# Patient Record
Sex: Female | Born: 1955 | Race: White | Hispanic: No | Marital: Married | Smoking: Former smoker
Health system: Southern US, Community
[De-identification: ages and names within clinical notes are randomized; demographics above are authoritative.]

## PROBLEM LIST (undated history)

## (undated) DIAGNOSIS — N210 Calculus in bladder: Secondary | ICD-10-CM

## (undated) DIAGNOSIS — G935 Compression of brain: Secondary | ICD-10-CM

## (undated) DIAGNOSIS — I7300 Raynaud's syndrome without gangrene: Secondary | ICD-10-CM

## (undated) DIAGNOSIS — F329 Major depressive disorder, single episode, unspecified: Secondary | ICD-10-CM

## (undated) DIAGNOSIS — G43009 Migraine without aura, not intractable, without status migrainosus: Secondary | ICD-10-CM

## (undated) DIAGNOSIS — M7550 Bursitis of unspecified shoulder: Secondary | ICD-10-CM

## (undated) DIAGNOSIS — I10 Essential (primary) hypertension: Secondary | ICD-10-CM

## (undated) DIAGNOSIS — M797 Fibromyalgia: Secondary | ICD-10-CM

## (undated) DIAGNOSIS — N390 Urinary tract infection, site not specified: Secondary | ICD-10-CM

## (undated) DIAGNOSIS — M545 Low back pain: Secondary | ICD-10-CM

## (undated) DIAGNOSIS — Z87442 Personal history of urinary calculi: Secondary | ICD-10-CM

## (undated) DIAGNOSIS — N23 Unspecified renal colic: Secondary | ICD-10-CM

## (undated) DIAGNOSIS — E785 Hyperlipidemia, unspecified: Secondary | ICD-10-CM

## (undated) DIAGNOSIS — R112 Nausea with vomiting, unspecified: Secondary | ICD-10-CM

## (undated) DIAGNOSIS — F419 Anxiety disorder, unspecified: Secondary | ICD-10-CM

## (undated) DIAGNOSIS — M359 Systemic involvement of connective tissue, unspecified: Secondary | ICD-10-CM

## (undated) DIAGNOSIS — Z9889 Other specified postprocedural states: Secondary | ICD-10-CM

## (undated) DIAGNOSIS — Z79899 Other long term (current) drug therapy: Secondary | ICD-10-CM

## (undated) HISTORY — PX: WISDOM TOOTH EXTRACTION: SHX21

## (undated) HISTORY — DX: Bursitis of unspecified shoulder: M75.50

## (undated) HISTORY — DX: Calculus in bladder: N21.0

## (undated) HISTORY — PX: OTHER SURGICAL HISTORY: SHX169

## (undated) HISTORY — DX: Other long term (current) drug therapy: Z79.899

## (undated) HISTORY — DX: Major depressive disorder, single episode, unspecified: F32.9

## (undated) HISTORY — DX: Migraine without aura, not intractable, without status migrainosus: G43.009

## (undated) HISTORY — DX: Fibromyalgia: M79.7

## (undated) HISTORY — DX: Essential (primary) hypertension: I10

## (undated) HISTORY — DX: Systemic involvement of connective tissue, unspecified: M35.9

## (undated) HISTORY — DX: Hyperlipidemia, unspecified: E78.5

## (undated) HISTORY — DX: Personal history of urinary calculi: Z87.442

## (undated) HISTORY — DX: Urinary tract infection, site not specified: N39.0

## (undated) HISTORY — DX: Low back pain: M54.5

## (undated) HISTORY — DX: Raynaud's syndrome without gangrene: I73.00

## (undated) HISTORY — DX: Unspecified renal colic: N23

---

## 1988-07-08 HISTORY — PX: ABDOMINAL HYSTERECTOMY: SHX81

## 1998-11-20 ENCOUNTER — Inpatient Hospital Stay (HOSPITAL_COMMUNITY): Admission: EM | Admit: 1998-11-20 | Discharge: 1998-11-23 | Payer: Self-pay | Admitting: Obstetrics and Gynecology

## 1998-11-27 ENCOUNTER — Encounter: Payer: Self-pay | Admitting: General Surgery

## 1998-11-27 ENCOUNTER — Observation Stay (HOSPITAL_COMMUNITY): Admission: EM | Admit: 1998-11-27 | Discharge: 1998-11-28 | Payer: Self-pay | Admitting: *Deleted

## 1998-12-18 ENCOUNTER — Ambulatory Visit (HOSPITAL_COMMUNITY): Admission: RE | Admit: 1998-12-18 | Discharge: 1998-12-18 | Payer: Self-pay | Admitting: General Surgery

## 2004-04-13 ENCOUNTER — Emergency Department (HOSPITAL_COMMUNITY): Admission: EM | Admit: 2004-04-13 | Discharge: 2004-04-13 | Payer: Self-pay | Admitting: *Deleted

## 2004-05-28 ENCOUNTER — Ambulatory Visit: Payer: Self-pay | Admitting: Internal Medicine

## 2004-06-14 ENCOUNTER — Ambulatory Visit (HOSPITAL_COMMUNITY): Admission: RE | Admit: 2004-06-14 | Discharge: 2004-06-14 | Payer: Self-pay | Admitting: Urology

## 2004-06-21 ENCOUNTER — Ambulatory Visit (HOSPITAL_COMMUNITY): Admission: RE | Admit: 2004-06-21 | Discharge: 2004-06-21 | Payer: Self-pay | Admitting: Urology

## 2004-07-03 ENCOUNTER — Ambulatory Visit: Payer: Self-pay | Admitting: Internal Medicine

## 2004-10-16 ENCOUNTER — Ambulatory Visit: Payer: Self-pay | Admitting: Internal Medicine

## 2004-12-12 ENCOUNTER — Ambulatory Visit: Payer: Self-pay | Admitting: Internal Medicine

## 2005-06-05 ENCOUNTER — Ambulatory Visit: Payer: Self-pay | Admitting: Internal Medicine

## 2005-07-22 ENCOUNTER — Ambulatory Visit: Payer: Self-pay | Admitting: Internal Medicine

## 2005-07-29 ENCOUNTER — Ambulatory Visit: Payer: Self-pay | Admitting: Internal Medicine

## 2005-10-21 ENCOUNTER — Ambulatory Visit: Payer: Self-pay | Admitting: Internal Medicine

## 2005-10-28 ENCOUNTER — Encounter: Payer: Self-pay | Admitting: Internal Medicine

## 2005-12-27 ENCOUNTER — Ambulatory Visit: Payer: Self-pay | Admitting: Internal Medicine

## 2006-03-31 ENCOUNTER — Ambulatory Visit: Payer: Self-pay | Admitting: Internal Medicine

## 2007-04-28 DIAGNOSIS — N390 Urinary tract infection, site not specified: Secondary | ICD-10-CM

## 2007-04-28 DIAGNOSIS — N200 Calculus of kidney: Secondary | ICD-10-CM | POA: Insufficient documentation

## 2007-04-28 DIAGNOSIS — I10 Essential (primary) hypertension: Secondary | ICD-10-CM | POA: Insufficient documentation

## 2007-04-28 DIAGNOSIS — Z87440 Personal history of urinary (tract) infections: Secondary | ICD-10-CM | POA: Insufficient documentation

## 2007-04-28 DIAGNOSIS — Z87442 Personal history of urinary calculi: Secondary | ICD-10-CM

## 2007-04-28 HISTORY — DX: Urinary tract infection, site not specified: N39.0

## 2007-04-28 HISTORY — DX: Personal history of urinary calculi: Z87.442

## 2007-04-28 HISTORY — DX: Essential (primary) hypertension: I10

## 2007-04-30 ENCOUNTER — Telehealth: Payer: Self-pay | Admitting: Internal Medicine

## 2007-05-26 ENCOUNTER — Encounter: Payer: Self-pay | Admitting: Internal Medicine

## 2007-10-02 ENCOUNTER — Encounter: Payer: Self-pay | Admitting: Internal Medicine

## 2007-10-05 ENCOUNTER — Ambulatory Visit: Payer: Self-pay | Admitting: Internal Medicine

## 2007-10-28 ENCOUNTER — Encounter: Payer: Self-pay | Admitting: Internal Medicine

## 2007-11-13 ENCOUNTER — Ambulatory Visit: Payer: Self-pay | Admitting: Internal Medicine

## 2007-11-13 DIAGNOSIS — R197 Diarrhea, unspecified: Secondary | ICD-10-CM | POA: Insufficient documentation

## 2007-11-13 LAB — CONVERTED CEMR LAB
Bilirubin Urine: NEGATIVE
Glucose, Urine, Semiquant: NEGATIVE
Ketones, urine, test strip: NEGATIVE
Nitrite: NEGATIVE
Specific Gravity, Urine: <1.005
Urobilinogen, UA: NEGATIVE
pH: 7

## 2007-12-02 ENCOUNTER — Ambulatory Visit: Payer: Self-pay | Admitting: Internal Medicine

## 2007-12-02 DIAGNOSIS — G8929 Other chronic pain: Secondary | ICD-10-CM | POA: Insufficient documentation

## 2007-12-02 DIAGNOSIS — M545 Low back pain, unspecified: Secondary | ICD-10-CM

## 2007-12-02 HISTORY — DX: Low back pain, unspecified: M54.50

## 2007-12-10 ENCOUNTER — Ambulatory Visit: Payer: Self-pay | Admitting: Internal Medicine

## 2007-12-14 ENCOUNTER — Encounter: Payer: Self-pay | Admitting: Internal Medicine

## 2007-12-14 ENCOUNTER — Ambulatory Visit: Payer: Self-pay | Admitting: Internal Medicine

## 2007-12-14 LAB — HM COLONOSCOPY

## 2007-12-15 ENCOUNTER — Encounter: Payer: Self-pay | Admitting: Internal Medicine

## 2007-12-17 ENCOUNTER — Telehealth (INDEPENDENT_AMBULATORY_CARE_PROVIDER_SITE_OTHER): Payer: Self-pay

## 2008-01-01 ENCOUNTER — Ambulatory Visit: Payer: Self-pay | Admitting: Internal Medicine

## 2008-01-01 DIAGNOSIS — M359 Systemic involvement of connective tissue, unspecified: Secondary | ICD-10-CM

## 2008-01-01 DIAGNOSIS — K52831 Collagenous colitis: Secondary | ICD-10-CM | POA: Insufficient documentation

## 2008-01-01 HISTORY — DX: Systemic involvement of connective tissue, unspecified: M35.9

## 2008-01-22 ENCOUNTER — Telehealth: Payer: Self-pay | Admitting: Internal Medicine

## 2008-01-29 ENCOUNTER — Ambulatory Visit: Payer: Self-pay | Admitting: Internal Medicine

## 2008-01-29 DIAGNOSIS — R1011 Right upper quadrant pain: Secondary | ICD-10-CM | POA: Insufficient documentation

## 2008-02-01 LAB — CONVERTED CEMR LAB
ALT: 23 units/L (ref 0–35)
AST: 15 units/L (ref 0–37)
Albumin: 4 g/dL (ref 3.5–5.2)
Alkaline Phosphatase: 62 units/L (ref 39–117)
Bilirubin, Direct: 0.1 mg/dL (ref 0.0–0.3)
Total Bilirubin: 0.6 mg/dL (ref 0.3–1.2)
Total Protein: 7.1 g/dL (ref 6.0–8.3)

## 2008-02-11 ENCOUNTER — Telehealth: Payer: Self-pay | Admitting: Internal Medicine

## 2008-02-12 ENCOUNTER — Ambulatory Visit: Payer: Self-pay | Admitting: Internal Medicine

## 2008-02-12 DIAGNOSIS — N23 Unspecified renal colic: Secondary | ICD-10-CM

## 2008-02-12 HISTORY — DX: Unspecified renal colic: N23

## 2008-02-12 LAB — CONVERTED CEMR LAB
Bilirubin Urine: NEGATIVE
Glucose, Urine, Semiquant: NEGATIVE
Ketones, urine, test strip: NEGATIVE
Nitrite: NEGATIVE
Specific Gravity, Urine: <1.005
Urobilinogen, UA: NEGATIVE
pH: 7

## 2008-02-15 ENCOUNTER — Ambulatory Visit (HOSPITAL_COMMUNITY): Admission: RE | Admit: 2008-02-15 | Discharge: 2008-02-15 | Payer: Self-pay | Admitting: Urology

## 2008-02-20 ENCOUNTER — Telehealth (INDEPENDENT_AMBULATORY_CARE_PROVIDER_SITE_OTHER): Payer: Self-pay | Admitting: *Deleted

## 2008-02-20 ENCOUNTER — Ambulatory Visit: Payer: Self-pay | Admitting: Family Medicine

## 2008-02-20 DIAGNOSIS — J029 Acute pharyngitis, unspecified: Secondary | ICD-10-CM | POA: Insufficient documentation

## 2008-02-20 LAB — CONVERTED CEMR LAB: Rapid Strep: NEGATIVE

## 2008-03-02 ENCOUNTER — Encounter: Payer: Self-pay | Admitting: Internal Medicine

## 2008-04-28 ENCOUNTER — Encounter: Payer: Self-pay | Admitting: Internal Medicine

## 2008-07-12 ENCOUNTER — Encounter: Payer: Self-pay | Admitting: Internal Medicine

## 2008-09-16 ENCOUNTER — Encounter: Payer: Self-pay | Admitting: Internal Medicine

## 2008-09-21 ENCOUNTER — Ambulatory Visit (HOSPITAL_BASED_OUTPATIENT_CLINIC_OR_DEPARTMENT_OTHER): Admission: RE | Admit: 2008-09-21 | Discharge: 2008-09-21 | Payer: Self-pay | Admitting: Urology

## 2008-10-06 ENCOUNTER — Encounter: Payer: Self-pay | Admitting: Internal Medicine

## 2008-12-02 ENCOUNTER — Encounter: Payer: Self-pay | Admitting: Internal Medicine

## 2008-12-06 ENCOUNTER — Ambulatory Visit: Payer: Self-pay | Admitting: Internal Medicine

## 2008-12-06 LAB — CONVERTED CEMR LAB
ALT: 22 units/L (ref 0–35)
AST: 19 units/L (ref 0–37)
Albumin: 3.8 g/dL (ref 3.5–5.2)
Alkaline Phosphatase: 61 units/L (ref 39–117)
BUN: 9 mg/dL (ref 6–23)
Basophils Absolute: 0.1 10*3/uL (ref 0.0–0.1)
Basophils Relative: 1.1 % (ref 0.0–3.0)
Bilirubin Urine: NEGATIVE
Bilirubin, Direct: 0.1 mg/dL (ref 0.0–0.3)
CO2: 28 meq/L (ref 19–32)
Calcium: 9.1 mg/dL (ref 8.4–10.5)
Chloride: 113 meq/L — ABNORMAL HIGH (ref 96–112)
Cholesterol: 296 mg/dL — ABNORMAL HIGH (ref 0–200)
Creatinine, Ser: 1 mg/dL (ref 0.4–1.2)
Direct LDL: 214.9 mg/dL
Eosinophils Absolute: 0.1 10*3/uL (ref 0.0–0.7)
Eosinophils Relative: 0.9 % (ref 0.0–5.0)
GFR calc non Af Amer: 62.12 mL/min (ref >60)
Glucose, Bld: 74 mg/dL (ref 70–99)
Glucose, Urine, Semiquant: NEGATIVE
HCT: 38.7 % (ref 36.0–46.0)
HDL: 73.7 mg/dL (ref >39.00)
Hemoglobin: 13.2 g/dL (ref 12.0–15.0)
Ketones, urine, test strip: NEGATIVE
Lymphocytes Relative: 34.1 % (ref 12.0–46.0)
Lymphs Abs: 2.2 10*3/uL (ref 0.7–4.0)
MCHC: 34 g/dL (ref 30.0–36.0)
MCV: 93 fL (ref 78.0–100.0)
Monocytes Absolute: 0.3 10*3/uL (ref 0.1–1.0)
Monocytes Relative: 5.3 % (ref 3.0–12.0)
Neutro Abs: 3.7 10*3/uL (ref 1.4–7.7)
Neutrophils Relative %: 58.6 % (ref 43.0–77.0)
Nitrite: NEGATIVE
Platelets: 275 10*3/uL (ref 150.0–400.0)
Potassium: 3.6 meq/L (ref 3.5–5.1)
Protein, U semiquant: NEGATIVE
RBC: 4.17 M/uL (ref 3.87–5.11)
RDW: 11.7 % (ref 11.5–14.6)
Sodium: 144 meq/L (ref 135–145)
Specific Gravity, Urine: 1.015
TSH: 0.91 microintl units/mL (ref 0.35–5.50)
Total Bilirubin: 0.8 mg/dL (ref 0.3–1.2)
Total CHOL/HDL Ratio: 4
Total Protein: 7 g/dL (ref 6.0–8.3)
Triglycerides: 80 mg/dL (ref 0.0–149.0)
Urobilinogen, UA: 0.2
VLDL: 16 mg/dL (ref 0.0–40.0)
WBC Urine, dipstick: NEGATIVE
WBC: 6.4 10*3/uL (ref 4.5–10.5)
pH: 7.5

## 2008-12-13 ENCOUNTER — Ambulatory Visit: Payer: Self-pay | Admitting: Internal Medicine

## 2008-12-13 DIAGNOSIS — E785 Hyperlipidemia, unspecified: Secondary | ICD-10-CM

## 2008-12-13 HISTORY — DX: Hyperlipidemia, unspecified: E78.5

## 2009-03-06 ENCOUNTER — Ambulatory Visit: Payer: Self-pay | Admitting: Internal Medicine

## 2009-03-06 LAB — CONVERTED CEMR LAB: IgA: 112 mg/dL (ref 68–378)

## 2009-03-09 LAB — CONVERTED CEMR LAB: Tissue Transglutaminase Ab, IgA: 0 units (ref <7)

## 2009-03-16 ENCOUNTER — Ambulatory Visit: Payer: Self-pay | Admitting: Internal Medicine

## 2009-04-04 ENCOUNTER — Ambulatory Visit: Payer: Self-pay | Admitting: Internal Medicine

## 2009-04-04 DIAGNOSIS — J069 Acute upper respiratory infection, unspecified: Secondary | ICD-10-CM | POA: Insufficient documentation

## 2009-04-20 ENCOUNTER — Encounter: Payer: Self-pay | Admitting: Internal Medicine

## 2009-04-22 ENCOUNTER — Ambulatory Visit (HOSPITAL_COMMUNITY): Admission: RE | Admit: 2009-04-22 | Discharge: 2009-04-22 | Payer: Self-pay | Admitting: Rheumatology

## 2009-04-27 ENCOUNTER — Ambulatory Visit: Payer: Self-pay | Admitting: Internal Medicine

## 2009-05-10 ENCOUNTER — Encounter: Admission: RE | Admit: 2009-05-10 | Discharge: 2009-05-22 | Payer: Self-pay | Admitting: Rheumatology

## 2009-06-06 ENCOUNTER — Encounter (INDEPENDENT_AMBULATORY_CARE_PROVIDER_SITE_OTHER): Payer: Self-pay | Admitting: *Deleted

## 2009-07-19 ENCOUNTER — Encounter: Admission: RE | Admit: 2009-07-19 | Discharge: 2009-07-19 | Payer: Self-pay | Admitting: Specialist

## 2009-09-14 ENCOUNTER — Ambulatory Visit: Payer: Self-pay | Admitting: Internal Medicine

## 2009-09-14 LAB — CONVERTED CEMR LAB: AST: 21 units/L (ref 0–37)

## 2010-01-04 ENCOUNTER — Telehealth: Payer: Self-pay | Admitting: Internal Medicine

## 2010-01-30 ENCOUNTER — Encounter: Payer: Self-pay | Admitting: Internal Medicine

## 2010-03-15 ENCOUNTER — Ambulatory Visit: Payer: Self-pay | Admitting: Internal Medicine

## 2010-04-10 ENCOUNTER — Telehealth: Payer: Self-pay | Admitting: Internal Medicine

## 2010-05-21 ENCOUNTER — Ambulatory Visit: Payer: Self-pay | Admitting: Cardiology

## 2010-05-21 ENCOUNTER — Observation Stay (HOSPITAL_COMMUNITY): Admission: EM | Admit: 2010-05-21 | Discharge: 2010-05-22 | Payer: Self-pay | Admitting: Emergency Medicine

## 2010-05-21 ENCOUNTER — Encounter: Payer: Self-pay | Admitting: Family Medicine

## 2010-05-21 ENCOUNTER — Ambulatory Visit: Payer: Self-pay | Admitting: Family Medicine

## 2010-05-21 DIAGNOSIS — F329 Major depressive disorder, single episode, unspecified: Secondary | ICD-10-CM

## 2010-05-21 DIAGNOSIS — F339 Major depressive disorder, recurrent, unspecified: Secondary | ICD-10-CM | POA: Insufficient documentation

## 2010-05-21 DIAGNOSIS — R079 Chest pain, unspecified: Secondary | ICD-10-CM | POA: Insufficient documentation

## 2010-05-21 DIAGNOSIS — F3289 Other specified depressive episodes: Secondary | ICD-10-CM

## 2010-05-21 HISTORY — DX: Major depressive disorder, single episode, unspecified: F32.9

## 2010-05-21 HISTORY — DX: Other specified depressive episodes: F32.89

## 2010-05-28 ENCOUNTER — Telehealth (INDEPENDENT_AMBULATORY_CARE_PROVIDER_SITE_OTHER): Payer: Self-pay | Admitting: *Deleted

## 2010-05-30 ENCOUNTER — Telehealth (INDEPENDENT_AMBULATORY_CARE_PROVIDER_SITE_OTHER): Payer: Self-pay | Admitting: *Deleted

## 2010-06-04 ENCOUNTER — Telehealth (INDEPENDENT_AMBULATORY_CARE_PROVIDER_SITE_OTHER): Payer: Self-pay | Admitting: *Deleted

## 2010-06-05 ENCOUNTER — Encounter (HOSPITAL_COMMUNITY)
Admission: RE | Admit: 2010-06-05 | Discharge: 2010-08-07 | Payer: Self-pay | Source: Home / Self Care | Attending: Cardiology | Admitting: Cardiology

## 2010-06-05 ENCOUNTER — Ambulatory Visit: Payer: Self-pay

## 2010-06-05 ENCOUNTER — Encounter: Payer: Self-pay | Admitting: *Deleted

## 2010-06-05 ENCOUNTER — Ambulatory Visit: Payer: Self-pay | Admitting: Cardiology

## 2010-06-06 ENCOUNTER — Encounter: Payer: Self-pay | Admitting: Internal Medicine

## 2010-06-07 ENCOUNTER — Telehealth: Payer: Self-pay | Admitting: Cardiology

## 2010-06-07 ENCOUNTER — Ambulatory Visit: Payer: Self-pay | Admitting: Cardiology

## 2010-07-11 ENCOUNTER — Telehealth: Payer: Self-pay | Admitting: Internal Medicine

## 2010-08-07 NOTE — Assessment & Plan Note (Signed)
Summary: cough/sob/njr   Vital Signs:  Patient profile:   55 year old female Height:      63 inches (160.02 cm) Weight:      135.31 pounds (61.50 kg) BMI:     24.06 O2 Sat:      98 % on Room air Temp:     98.1 degrees F (36.72 degrees C) oral Pulse rate:   95 / minute BP sitting:   158 / 100  (left arm) Cuff size:   regular  Vitals Entered By: Josph Macho RMA (May 21, 2010 10:09 AM)  O2 Flow:  Room air CC: Cough/ SOB X2 days/ cough w/ a little phlegm (yellowish)/ legs hurt yesterday w/shaking (today just sore)/ CF Is Patient Diabetic? No   CC:  Cough/ SOB X2 days/ cough w/ a little phlegm (yellowish)/ legs hurt yesterday w/shaking (today just sore)/ CF.  History of Present Illness: 55 y/o WF with HTN, hyperlipidemia, and FH of early CAD presents with 24 hour hx of chest heaviness, central chest pressure and pain.  Feels SOB, and has nausea without vomiting.  She denies palpitations, LE edema, or fevers.  Rare coughing last 1 day, no URI symptoms.  Both calves felt achy all day yesterday, and she had severe RLS symptoms last night.  Has progressively felt worse and worse...."something just doesn't feel right".  Says she can't think clearly last day or two, admits to feeling depressed lately but denies significant psychosocial stressors lately. No prolonged immobility lately, no recent procedure/trauma, no oral contraceptives, no FH of thrombophilic syndrome.   She did take ASA this am at home.  Problems Prior to Update: 1)  Depressive Disorder  (ICD-311) 2)  Chest Pain Unspecified  (ICD-786.50) 3)  Uri  (ICD-465.9) 4)  Uri  (ICD-465.9) 5)  Hyperlipidemia  (ICD-272.4) 6)  Sore Throat  (ICD-462) 7)  Renal Colic  (ICD-788.0) 8)  Ruq Pain  (ICD-789.01) 9)  Collagenous Colitis  (ICD-710.9) 10)  Family History of Cad Female 1st Degree Relative <50  (ICD-V17.3) 11)  Low Back Pain  (ICD-724.2) 12)  Diarrhea  (ICD-787.91) 13)  Uti's, Recurrent  (ICD-599.0) 14)   Nephrolithiasis, Hx of  (ICD-V13.01) 15)  Hypertension  (ICD-401.9)  Medications Prior to Update: 1)  Verapamil Hcl Cr 240 Mg  Tbcr (Verapamil Hcl) .... One Tablet Po Daily 2)  Vitamin D 1000 Unit Tabs (Cholecalciferol) .Marland Kitchen.. 1 Two Times A Day 3)  Zolpidem Tartrate 10 Mg Tabs (Zolpidem Tartrate) .... One At Bedtime As Needed For Sleep 4)  Lipitor 40 Mg Tabs (Atorvastatin Calcium) .... One Daily 5)  Vicodin 5-500 Mg Tabs (Hydrocodone-Acetaminophen) .... At Bedtime Prn 6)  Aspir-Low 81 Mg Tbec (Aspirin) .... Qd 7)  Neurontin 100 Mg Caps (Gabapentin) .... 3 By Mouth Tid 8)  Relpax 40 Mg Tabs (Eletriptan Hydrobromide) .... As Needed Migraines 9)  Clonazepam 0.5 Mg Tabs (Clonazepam) .... 1/2 To 1 By Mouth At Bedtime As Needed  Current Medications (verified): 1)  Verapamil Hcl Cr 240 Mg  Tbcr (Verapamil Hcl) .... One Tablet Po Daily 2)  Vitamin D 1000 Unit Tabs (Cholecalciferol) .Marland Kitchen.. 1 Two Times A Day 3)  Zolpidem Tartrate 10 Mg Tabs (Zolpidem Tartrate) .... One At Bedtime As Needed For Sleep 4)  Lipitor 40 Mg Tabs (Atorvastatin Calcium) .... One Daily 5)  Vicodin 5-500 Mg Tabs (Hydrocodone-Acetaminophen) .... At Bedtime Prn 6)  Aspir-Low 81 Mg Tbec (Aspirin) .... Qd 7)  Relpax 40 Mg Tabs (Eletriptan Hydrobromide) .... As Needed Migraines 8)  Clonazepam 0.5  Mg Tabs (Clonazepam) .... 1/2 To 1 By Mouth At Bedtime As Needed  Allergies (verified): 1)  ! Avelox (Moxifloxacin Hcl) 2)  ! Codeine Sulfate (Codeine Sulfate) 3)  ! Penicillin V Potassium (Penicillin V Potassium) 4)  ! Ultram Er (Tramadol Hcl) 5)  ! Benadryl (Diphenhydramine Hcl) 6)  ! Sulfa  Past History:  Family History: Last updated: May 23, 2009 father died of suicide death at 31.  History of dementia Family History of CAD Female 1st degree relative <50 Family History Hypertension: Father/ mother Family History of Stroke F 1st degree relative <60 Mother:   HTN, Osteoporosis History  of Inflammatory Bowel Disease: Brother with  Crohn's No FH of Colon Cancer 4 brothers, one sister: h/o hepatitis C  Risk Factors: Caffeine Use: 1 (01/29/2008) Exercise: yes (12/10/2007)  Past medical, surgical, family and social histories (including risk factors) reviewed, and no changes noted (except as noted below).  Past Medical History: Reviewed history from 09/14/2009 and no changes required. Hypertension Nephrolithiasis, hx of recurrent UTI's fibromyalgia, and chronic fatigue migraine headaches menopausal syndrome Low back pain history of concussion, 1999 Raynoud's collagenous colitis Hyperlipidemia Vitamin D deficiency bladder stones bursitis in shoulder  Past Surgical History: Reviewed history from 12/13/2008 and no changes required. laparoscopy 1999 Hysterectomy 2000 for fibroids and SUI colonoscopy 6/09 status post cystoscopy for bladder stones  Family History: Reviewed history from 2009/05/23 and no changes required. father died of suicide death at 40.  History of dementia Family History of CAD Female 1st degree relative <50 Family History Hypertension: Father/ mother Family History of Stroke F 1st degree relative <60 Mother:   HTN, Osteoporosis History  of Inflammatory Bowel Disease: Brother with Crohn's No FH of Colon Cancer 4 brothers, one sister: h/o hepatitis C  Social History: Reviewed history from May 23, 2009 and no changes required. Married, grown children Illicit Drug Use - no Alcohol Use - no Patient gets regular exercise. 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         Also, see HPI.  Physical Exam  General:  Gen: alert, anxious and crying, oriented x 4.  No resp distress. HEENT: Scalp without lesions or hair  loss.  Ears: EACs clear, normal epithelium.  TMs with good light reflex and landmarks bilaterally.  Eyes: no injection, icteris, swelling, or exudate.  EOMI, PERRLA. Nose: no drainage or turbinate edema/swelling.  No inection or focal lesion.  Mouth: lips without lesion/swelling.  Oral mucosa pink and moist.  Dentition intact and without obvious caries or gingival swelling.  Oropharynx without erythema, exudate, or swelling.  Neck: supple.  No lymphadenopathy, thyromegaly, or mass. Chest: symmetric expansion, with nonlabored respirations.  Clear and equal breath sounds in all lung fields.   CV: RRR, no m/r/g.  Peripheral pulses 2+/symmetric. EXT: no clubbing, cyanosis, or edema.     Impression & Recommendations:  Problem # 1:  CHEST PAIN UNSPECIFIED (ICD-786.50) She needs to go to the ED now to get CP w/u and most likely get admitted for 24h r/o MI. Her EKG here is normal.  I've discussed this with her and she wants to call her husband and have him pick her up and take her to ED and I told her this is fine.  She had ASA this am  at home.  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 She is encouraged to f/u with this at her earliest convenience for this so this can be addressed further.    Complete Medication List: 1)  Verapamil Hcl Cr 240 Mg Tbcr (Verapamil hcl) .... One tablet po daily 2)  Vitamin D 1000 Unit Tabs (Cholecalciferol) .Marland Kitchen.. 1 two times a day 3)  Zolpidem Tartrate 10 Mg Tabs (Zolpidem tartrate) .... One at bedtime as needed for sleep 4)  Lipitor 40 Mg Tabs (Atorvastatin calcium) .... One daily 5)  Vicodin 5-500 Mg Tabs (Hydrocodone-acetaminophen) .... At bedtime prn 6)  Aspir-low 81 Mg Tbec (Aspirin) .... Qd 7)  Relpax 40 Mg Tabs (Eletriptan hydrobromide) .... As needed migraines 8)  Clonazepam 0.5 Mg Tabs (Clonazepam) .... 1/2 to 1 by mouth at bedtime as  needed  Other Orders: EKG w/ Interpretation (93000)  Patient Instructions: 1)  Go directly to the ED. 2)  F/u here at your earliest convenience.   Orders Added: 1)  EKG w/ Interpretation [93000] 2)  Est. Patient Level IV [81191]

## 2010-08-07 NOTE — Assessment & Plan Note (Signed)
Summary: 6 month rov/njr   Vital Signs:  Patient profile:   55 year old female Weight:      137 pounds Temp:     98.6 degrees F oral BP sitting:   128 / 74  (right arm) Cuff size:   regular  Vitals Entered By: Duard Brady LPN (September 14, 2009 4:16 PM) CC: 6 mos rov - just ok , stress , ?lipitor c/o joint aches Is Patient Diabetic? No   Primary Care Provider:  Eleonore Chiquito, MD  CC:  6 mos rov - just ok , stress , and ?lipitor c/o joint aches.  History of Present Illness: 55 year old patient has a history of hypertension, and dyslipidemia.  She is followed by rheumatology also for fibromyalgia.  She is doing quite well.  She is and some muscle aches and joint pain and is wondering whether Lipitor may be a factor.  She has been on Lipitor since June of last year.  Her fibromyalgia has been stable.   Preventive Screening-Counseling & Management  Alcohol-Tobacco     Smoking Status: never  Allergies: 1)  ! Avelox (Moxifloxacin Hcl) 2)  ! Codeine Sulfate (Codeine Sulfate) 3)  ! Penicillin V Potassium (Penicillin V Potassium) 4)  ! Ultram Er (Tramadol Hcl) 5)  ! Benadryl (Diphenhydramine Hcl) 6)  ! Sulfa  Past History:  Past Medical History: Hypertension Nephrolithiasis, hx of recurrent UTI's fibromyalgia, and chronic fatigue migraine headaches menopausal syndrome Low back pain history of concussion, 1999 Raynoud's collagenous colitis Hyperlipidemia Vitamin D deficiency bladder stones bursitis in shoulder  Family History: Reviewed history from 04/27/2009 and no changes required. father died of suicide death at 62.  History of dementia Family History of CAD Female 1st degree relative <50 Family History Hypertension: Father/ mother Family History of Stroke F 1st degree relative <60 Mother:   HTN, Osteoporosis History  of Inflammatory Bowel Disease: Brother with Crohn's No FH of Colon Cancer 4 brothers, one sister: h/o hepatitis C  Social  History: Reviewed history from 04/27/2009 and no changes required. Married, grown children Illicit Drug Use - no Alcohol Use - no Patient gets regular exercise. 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  The patient denies anorexia, fever, weight loss, weight gain, vision loss, decreased hearing, hoarseness, chest pain, syncope, dyspnea on exertion, peripheral edema, prolonged cough, headaches, hemoptysis, abdominal pain, melena, hematochezia, severe indigestion/heartburn, hematuria, incontinence, genital sores, suspicious skin lesions, transient blindness, difficulty walking, unusual weight change, abnormal bleeding, enlarged lymph nodes, angioedema, and breast masses.    Physical Exam  General:  Well-developed,well-nourished,in no acute distress; alert,appropriate and cooperative throughout examination Head:  Normocephalic and atraumatic without obvious abnormalities. No apparent alopecia or balding. Eyes:  No corneal or conjunctival inflammation noted. EOMI. Perrla. Funduscopic exam benign, without hemorrhages, exudates or papilledema. Vision grossly normal. Mouth:  Oral mucosa and oropharynx without lesions or exudates.  Teeth in good repair. Neck:  No deformities, masses, or tenderness noted. Lungs:  Normal respiratory effort, chest expands symmetrically. Lungs are clear to auscultation, no crackles or wheezes. Heart:  Normal rate and regular rhythm. S1 and S2 normal without gallop, murmur, click, rub or other extra sounds. Abdomen:  Bowel sounds positive,abdomen soft and non-tender without masses, organomegaly or hernias noted. Pulses:  R  and L carotid,radial,femoral,dorsalis pedis and posterior tibial pulses are full and equal bilaterally Extremities:  No clubbing, cyanosis,  edema, or deformity noted with normal full range of motion of all joints.     Impression & Recommendations:  Problem # 1:  HYPERLIPIDEMIA (ICD-272.4)  Her updated medication list for this problem includes:    Lipitor 40 Mg Tabs (Atorvastatin calcium) ..... One daily    Her updated medication list for this problem includes:    Lipitor 40 Mg Tabs (Atorvastatin calcium) ..... One daily  Problem # 2:  HYPERTENSION (ICD-401.9)  Her updated medication list for this problem includes:    Verapamil Hcl Cr 240 Mg Tbcr (Verapamil hcl) ..... One tablet po daily  Her updated medication list for this problem includes:    Verapamil Hcl Cr 240 Mg Tbcr (Verapamil hcl) ..... One tablet po daily  Complete Medication List: 1)  Topamax 25 Mg Tabs (Topiramate) .... 2 at bedtime 2)  Verapamil Hcl Cr 240 Mg Tbcr (Verapamil hcl) .... One tablet po daily 3)  Vitamin D 1000 Unit Tabs (Cholecalciferol) .Marland Kitchen.. 1 two times a day 4)  Zolpidem Tartrate 10 Mg Tabs (Zolpidem tartrate) .... One at bedtime as needed for sleep 5)  Lipitor 40 Mg Tabs (Atorvastatin calcium) .... One daily 6)  Align Caps (Probiotic product) .... One tablet by mouth once daily 7)  Vicodin 5-500 Mg Tabs (Hydrocodone-acetaminophen) .... At bedtime prn 8)  Aspir-low 81 Mg Tbec (Aspirin) .... Qd 9)  Flexeril 5 Mg Tabs (Cyclobenzaprine hcl) .... Unsure of mg - qhs 10)  Neurontin 100 Mg Caps (Gabapentin) .... Tid  Other Orders: Venipuncture (04540) TLB-AST (SGOT) (84450-SGOT) TLB-Cholesterol, Total (82465-CHO)  Patient Instructions: 1)  Please schedule a follow-up appointment in 6 months. 2)  Limit your Sodium (Salt) to less than 2 grams a day(slightly less than 1/2 a teaspoon) to prevent fluid retention, swelling, or worsening of symptoms. 3)  It is important that you exercise regularly at least 20 minutes 5 times a week. If you develop chest pain, have severe difficulty breathing, or feel very tired , stop exercising immediately and  seek medical attention. Prescriptions: LIPITOR 40 MG TABS (ATORVASTATIN CALCIUM) one daily  #90 x 5   Entered and Authorized by:   Gordy Savers  MD   Signed by:   Gordy Savers  MD on 09/14/2009   Method used:   Print then Give to Patient   RxID:   [id number] VERAPAMIL HCL CR 240 MG  TBCR (VERAPAMIL HCL) one tablet po daily  #90 x 6   Entered and Authorized by:   Gordy Savers  MD   Signed by:   Gordy Savers  MD on 09/14/2009   Method used:   Print then Give to Patient   RxID:   [id number] LIPITOR 40 MG TABS (ATORVASTATIN CALCIUM) one daily  #90 x 5   Entered and Authorized by:   Gordy Savers  MD   Signed by:   Gordy Savers  MD on 09/14/2009   Method used:   Electronically to        [facility]  [place] [address] (retail)       [address]       [place], [place]  [address]       Ph: [phone] or [phone]       Fax: [phone]   RxID:   [id number] VERAPAMIL HCL CR 240 MG  TBCR (VERAPAMIL HCL) one tablet po daily  #  90 x 6   Entered and Authorized by:   Gordy Savers  MD   Signed by:   Gordy Savers  MD on 09/14/2009   Method used:   Electronically to        [facility]  [place] [address] (retail)       [address]       [place], [place]  [address]       Ph: [phone] or [phone]       Fax: [phone]   RxID:   [id number]

## 2010-08-07 NOTE — Assessment & Plan Note (Signed)
Summary: Cardiology Nuclear Testing  Nuclear Med Background Indications for Stress Test: Evaluation for Ischemia, Post Hospital  Indications Comments: 05/21/10 Chest pain, (-) enzymes   History Comments: No documented CAD.  Symptoms: Chest Pain, Chest Pressure, Diaphoresis, Fatigue, Nausea, Rapid HR  Symptoms Comments: Last episode of EA:VWUJ since d/c.   Nuclear Pre-Procedure Cardiac Risk Factors: Family History - CAD, History of Smoking, Hypertension, Lipids Caffeine/Decaff Intake: none NPO After: 7:30 PM Lungs: Clear. IV 0.9% NS with Angio Cath: 22g     IV Site: R Hand IV Started by: Doyne Keel, CNMT Chest Size (in) 36     Cup Size B     Height (in): 63 Weight (lb): 130 BMI: 23.11  Nuclear Med Study 1 or 2 day study:  1 day     Stress Test Type:  Stress Reading MD:  Cassell Clement, MD     Referring MD:  Olga Millers, MD Resting Radionuclide:  Technetium 52m Tetrofosmin     Resting Radionuclide Dose:  11.0 mCi  Stress Radionuclide:  Technetium 64m Tetrofosmin     Stress Radionuclide Dose:  33.0 mCi   Stress Protocol Exercise Time (min):  8:31 min     Max HR:  144 bpm     Predicted Max HR:  168 bpm  Max Systolic BP: 100 mm Hg     Percent Max HR:  85.71 %     METS: 10.4 Rate Pressure Product:  81191    Stress Test Technologist:  [employer]a [employer], CMA-N     Nuclear Technologist:  Doyne Keel, CNMT  Rest Procedure  Myocardial perfusion imaging was performed at rest 45 minutes following the intravenous administration of Technetium 61m Tetrofosmin.  Stress Procedure  The patient exercised for 8:31.  The patient stopped due to fatigue and denied any chest pain.  There were no diagnostic ST-T wave changes with exercise, there were minor nonspecific ST-T wave changes in recovery.  She had a blunted BP response to exercise, her baseline BP was 95/67 sitting and 87/59 standing and her peak BP exercising was 100/53.  BP after stress images 84/58; she is asymptomatic.  (BP issue  discussed with Dr. Jens Som prior to patient leaving).  Technetium 2m Tetrofosmin was injected at peak exercise and myocardial perfusion imaging was performed after a brief delay.  QPS Raw Data Images:  Normal; no motion artifact; normal heart/lung ratio. Stress Images:  Normal homogeneous uptake in all areas of the myocardium. Rest Images:  Normal homogeneous uptake in all areas of the myocardium. Subtraction (SDS):  No evidence of ischemia. Transient Ischemic Dilatation:  0.91  (Normal <1.22)  Lung/Heart Ratio:  0.25  (Normal <0.45)  Quantitative Gated Spect Images QGS EDV:  66 ml QGS ESV:  27 ml QGS EF:  59 % QGS cine images:  Normal LV systolic function.  Findings Normal nuclear study      Overall Impression  Exercise Capacity: Good exercise capacity. BP Response: Blunted BP response Clinical Symptoms: No chest pain ECG Impression: No significant ST segment change suggestive of ischemia. Overall Impression: Normal stress nuclear study. Overall Impression Comments: Normal stress nuclear study.  No ischemia.  Good LV systolic function.  Appended Document: Cardiology Nuclear Testing ok  Appended Document: Cardiology Nuclear Testing PT AWARE./CY

## 2010-08-07 NOTE — Letter (Signed)
Summary: [facility]  [facility]   Imported By: Maryln Gottron 02/12/2010 15:36:38  _____________________________________________________________________  External Attachment:    Type:   Image     Comment:   External Document

## 2010-08-07 NOTE — Progress Notes (Signed)
----   Converted from flag ---- Flag Received  ---- 05/30/2010 1:29 PM, Bary Leriche wrote: Hi, I am sending Dr. Shirlee Latch an FMLA for the daughter of patient [name]. dob March 31, 1956.  Pt. was in [facility] and daughter took care of her for the dates of 11-14 and 60-15.  Thank you  Elease Hashimoto at [employer]. ------------------------------

## 2010-08-07 NOTE — Progress Notes (Signed)
Summary: Nuclear pre procedure  Phone Note Outgoing Call Call back at [facility] Phone [phone]   Call placed by: Rea College, CMA,  June 04, 2010 4:54 PM Call placed to: Patient Summary of Call: Attempted to call patient with instructions for myoview, but there was no answer at home and she was out of work until next week.     Nuclear Med Background Indications for Stress Test: Evaluation for Ischemia, Post Hospital  Indications Comments: 05/21/10 Chest pain, (-) enzymes    Symptoms: Chest Pain, Chest Pressure, SOB    Nuclear Pre-Procedure Cardiac Risk Factors: Family History - CAD, Hypertension, Lipids Height (in): 63

## 2010-08-07 NOTE — Progress Notes (Signed)
Summary: refill zolpidem  Phone Note Refill Request Message from:  Fax from Pharmacy on January 04, 2010 3:44 PM  Refills Requested: Medication #1:  ZOLPIDEM TARTRATE 10 MG TABS one at bedtime as needed for sleep   Last Refilled: 11/07/2009 [facility]   Method Requested: Fax to Local Pharmacy Initial call taken by: Duard Brady LPN,  January 04, 2010 3:45 PM    Prescriptions: ZOLPIDEM TARTRATE 10 MG TABS (ZOLPIDEM TARTRATE) one at bedtime as needed for sleep  #50 x 3   Entered by:   Duard Brady LPN   Authorized by:   Gordy Savers  MD   Signed by:   Duard Brady LPN on 04/54/0981   Method used:   Historical   RxID:   [id number]  faxed to [facility]   K[facility]

## 2010-08-07 NOTE — Assessment & Plan Note (Signed)
Summary: EPH/ GD   Visit Type:  eph Referring Laronn Devonshire:  n/a Primary Rivka Baune:  Eleonore Chiquito, MD  CC:  headache and dizziness.  History of Present Illness: 55 yo with history of hyperlipidemia, HTN, fibromyalgia presents for hospital followup.  Patient was admitted on 11/15 after 5 hours of chst pain.  She was sitting at her desk at work and developed severe lower substernal pressure.  She had not eaten a meal prior to this.  She had been under a lot of stress at work that week.  The pain was unrelenting for 5 hours then died away.  She went to the ER where CXR was unremarkable and was admitted overnight.  She ruled out for MI and was discharged for an outpatient stress test.  She had an ETT-myoview with no evidence for ischemia or infarction.  She says that she has had intermittent (rare) atypical chest pain for years.  However, the pain tends to last for a few seconds.  She has had a few episodes of fleeting CP lasting 5-10 seconds since the severe episode.  She does not get exertional chest pain or dyspnea.   ECG: NSR, normal  Labs (11/11): LDL 113, HDL 54, K 4.1, creatinine 0.45, TSH normal  Current Medications (verified): 1)  Verapamil Hcl Cr 240 Mg  Tbcr (Verapamil Hcl) .... One Tablet Po Daily 2)  Vitamin D 1000 Unit Tabs (Cholecalciferol) .... 2000mg  Once Daily 3)  Zolpidem Tartrate 10 Mg Tabs (Zolpidem Tartrate) .... One At Bedtime As Needed For Sleep 4)  Lipitor 40 Mg Tabs (Atorvastatin Calcium) .... One Daily 5)  Vicodin 5-500 Mg Tabs (Hydrocodone-Acetaminophen) .... At Bedtime Prn 6)  Aspir-Low 81 Mg Tbec (Aspirin) .... Qd 7)  Relpax 40 Mg Tabs (Eletriptan Hydrobromide) .... As Needed Migraines 8)  Clonazepam 0.5 Mg Tabs (Clonazepam) .... 1/2 To 1 By Mouth At Bedtime As Needed 9)  Neurontin 100 Mg Caps (Gabapentin) .... Three Times A Day  Allergies (verified): 1)  ! Avelox (Moxifloxacin Hcl) 2)  ! Codeine Sulfate (Codeine Sulfate) 3)  ! Penicillin V Potassium  (Penicillin V Potassium) 4)  ! Ultram Er (Tramadol Hcl) 5)  ! Benadryl (Diphenhydramine Hcl) 6)  ! Sulfa  Past History:  Past Medical History: 1. Hypertension 2. Nephrolithiasis, hx of 3. recurrent UTI's 4. fibromyalgia, and chronic fatigue 5. migraine headaches 6. menopausal syndrome 7. Low back pain 8. history of concussion, 1999 9. Raynaud's 10. Collagenous colitis 11. Hyperlipidemia 12. Vitamin D deficiency 13. bladder stones 14. bursitis in shoulder 15. Chest pain: ETT-myoview (11/11) 8'31" exercise, EF 59%, no perfusion defect but blunted BP response to exercise (patient had taken verapamil).  Family History: father died of suicide death at 44.  History of dementia Father with MI at 12, Grandmother and grandfather both with MIs Family History Hypertension: Father/ mother Family History of Stroke F 1st degree relative <60 Mother:   HTN, Osteoporosis History  of Inflammatory Bowel Disease: Brother with Crohn's No FH of Colon Cancer 4 brothers, one sister: h/o hepatitis C  Social History: Reviewed history from 04/27/2009 and no changes required. Married, grown children Illicit Drug Use - no Alcohol Use - no Patient gets regular exercise. walks 30 min a day 4-5 days a week  Daily Caffeine Use small cup of cofee Customer service rep at [employer] Patient is a former smoker. stopped 8/09  Review of Systems       All systems reviewed and negative except as per HPI.   Vital Signs:  Patient profile:  55 year old female Height:      63 inches Weight:      132.25 pounds BMI:     23.51 Pulse rate:   74 / minute BP sitting:   104 / 68  (left arm) Cuff size:   regular  Vitals Entered By: Caralee Ates CMA (June 07, 2010 9:18 AM)  Physical Exam  General:  Well developed, well nourished, in no acute distress. Head:  normocephalic and atraumatic Nose:  no deformity, discharge, inflammation, or lesions Mouth:  Teeth, gums and palate normal. Oral mucosa  normal. Neck:  Neck supple, no JVD. No masses, thyromegaly or abnormal cervical nodes. Lungs:  Clear bilaterally to auscultation and percussion. Heart:  Non-displaced PMI, chest non-tender; regular rate and rhythm, S1, S2 without murmurs, rubs or gallops. Carotid upstroke normal, no bruit.  Pedals normal pulses. No edema, no varicosities. Abdomen:  Bowel sounds positive; abdomen soft and non-tender without masses, organomegaly, or hernias noted. No hepatosplenomegaly. Msk:  Back normal, normal gait. Muscle strength and tone normal. Extremities:  No clubbing or cyanosis. Neurologic:  Alert and oriented x 3. Skin:  Intact without lesions or rashes. Psych:  Normal affect.   Impression & Recommendations:  Problem # 1:  CHEST PAIN UNSPECIFIED (ICD-786.50) I suspect that Mrs Deupree' chest pain was noncardiac.  She had a prolonged episode with no elevation in cardiac enzymes.  ECG was unremarkable.  ETT-myoview showed reasonable exercise tolerance with no ischemia or infarction on perfusion imaging.  Pain could have been GERD versus esophageal spasm versus anxiety. No further testing is necessary.   Patient Instructions: 1)  Your physician recommends that you schedule a follow-up appointment as needed with Dr Shirlee Latch.

## 2010-08-07 NOTE — Progress Notes (Signed)
Summary: refill verapamil  Phone Note Refill Request Message from:  Fax from Pharmacy on April 10, 2010 10:45 AM  Refills Requested: Medication #1:  VERAPAMIL HCL CR 240 MG  TBCR one tablet po daily [employer] home delivery    Method Requested: Fax to Local Pharmacy Initial call taken by: Duard Brady LPN,  April 10, 2010 10:45 AM    Prescriptions: VERAPAMIL HCL CR 240 MG  TBCR (VERAPAMIL HCL) one tablet po daily  #90 x 3   Entered by:   Duard Brady LPN   Authorized by:   Gordy Savers  MD   Signed by:   Duard Brady LPN on 19/14/7829   Method used:   Faxed to ...       [address] [facility] (mail-order)             , [place]         Ph: [phone]       Fax: [phone]   RxID:   [id number]  faxed to [employer] home delivery. KIK

## 2010-08-07 NOTE — Assessment & Plan Note (Signed)
Summary: 6 month fup//ccm   Vital Signs:  Patient profile:   55 year old female Weight:      140 pounds Temp:     98.6 degrees F oral BP sitting:   110 / 70  (right arm) Cuff size:   regular  Vitals Entered By: Duard Brady LPN (March 15, 2010 4:22 PM) CC: 6 mos rov - ok Is Patient Diabetic? No Flu Vaccine Consent Questions     Do you have a history of severe allergic reactions to this vaccine? no    Any prior history of allergic reactions to egg and/or gelatin? no    Do you have a sensitivity to the preservative Thimersol? no    Do you have a past history of Guillan-Barre Syndrome? no    Do you currently have an acute febrile illness? no    Have you ever had a severe reaction to latex? no    Vaccine information given and explained to patient? yes    Are you currently pregnant? no    Lot Number:[id number]   Exp Date:01/05/2011   Site Given  Left Deltoid IM   Primary Care Provider:  Eleonore Chiquito, MD  CC:  6 mos rov - ok.  History of Present Illness: 55 year old patient who is seen today for follow-up.  She has a history of dyslipidemia, which has been well controlled on Lipitor 40 mg daily.  She has a history of fibromyalgia and chronic pain.  She seems to be fairly stable.  She has treated hypertension.  Allergies: 1)  ! Avelox (Moxifloxacin Hcl) 2)  ! Codeine Sulfate (Codeine Sulfate) 3)  ! Penicillin V Potassium (Penicillin V Potassium) 4)  ! Ultram Er (Tramadol Hcl) 5)  ! Benadryl (Diphenhydramine Hcl) 6)  ! Sulfa  Past History:  Past Medical History: Reviewed history from 09/14/2009 and no changes required. Hypertension Nephrolithiasis, hx of recurrent UTI's fibromyalgia, and chronic fatigue migraine headaches menopausal syndrome Low back pain history of concussion, 1999 Raynoud's collagenous colitis Hyperlipidemia Vitamin D deficiency bladder stones bursitis in shoulder  Past Surgical History: Reviewed history from 12/13/2008 and no  changes required. laparoscopy 1999 Hysterectomy 2000 for fibroids and SUI colonoscopy 6/09 status post cystoscopy for bladder stones  Review of Systems       The patient complains of headaches.  The patient denies anorexia, fever, weight loss, weight gain, vision loss, decreased hearing, hoarseness, chest pain, syncope, dyspnea on exertion, peripheral edema, prolonged cough, hemoptysis, abdominal pain, melena, hematochezia, severe indigestion/heartburn, hematuria, incontinence, genital sores, muscle weakness, suspicious skin lesions, transient blindness, difficulty walking, depression, unusual weight change, abnormal bleeding, enlarged lymph nodes, angioedema, and breast masses.    Physical Exam  General:  mildly overweight.  Blood pressure 120/78 Head:  Normocephalic and atraumatic without obvious abnormalities. No apparent alopecia or balding. Eyes:  No corneal or conjunctival inflammation noted. EOMI. Perrla. Funduscopic exam benign, without hemorrhages, exudates or papilledema. Vision grossly normal. Mouth:  Oral mucosa and oropharynx without lesions or exudates.  Teeth in good repair. Neck:  No deformities, masses, or tenderness noted. Lungs:  Normal respiratory effort, chest expands symmetrically. Lungs are clear to auscultation, no crackles or wheezes. Heart:  Normal rate and regular rhythm. S1 and S2 normal without gallop, murmur, click, rub or other extra sounds. Abdomen:  Bowel sounds positive,abdomen soft and non-tender without masses, organomegaly or hernias noted. Msk:  No deformity or scoliosis noted of thoracic or lumbar spine.     Impression & Recommendations:  Problem # 1:  HYPERLIPIDEMIA (ICD-272.4)  Her updated medication list for this problem includes:    Lipitor 40 Mg Tabs (Atorvastatin calcium) ..... One daily  Problem # 2:  HYPERTENSION (ICD-401.9)  Her updated medication list for this problem includes:    Verapamil Hcl Cr 240 Mg Tbcr (Verapamil hcl) ..... One  tablet po daily  Complete Medication List: 1)  Verapamil Hcl Cr 240 Mg Tbcr (Verapamil hcl) .... One tablet po daily 2)  Vitamin D 1000 Unit Tabs (Cholecalciferol) .Marland Kitchen.. 1 two times a day 3)  Zolpidem Tartrate 10 Mg Tabs (Zolpidem tartrate) .... One at bedtime as needed for sleep 4)  Lipitor 40 Mg Tabs (Atorvastatin calcium) .... One daily 5)  Vicodin 5-500 Mg Tabs (Hydrocodone-acetaminophen) .... At bedtime prn 6)  Aspir-low 81 Mg Tbec (Aspirin) .... Qd 7)  Neurontin 100 Mg Caps (Gabapentin) .... 3 by mouth tid 8)  Relpax 40 Mg Tabs (Eletriptan hydrobromide) .... As needed migraines 9)  Clonazepam 0.5 Mg Tabs (Clonazepam) .... 1/2 to 1 by mouth at bedtime as needed  Other Orders: Admin 1st Vaccine (34742) Flu Vaccine 32yrs + (59563)  Patient Instructions: 1)  Please schedule a follow-up appointment in 6 months for annual exam  2)  Limit your Sodium (Salt). 3)  It is important that you exercise regularly at least 20 minutes 5 times a week. If you develop chest pain, have severe difficulty breathing, or feel very tired , stop exercising immediately and seek medical attention.

## 2010-08-07 NOTE — Progress Notes (Signed)
  [employer] ( [employer]ML[employer]) paperwork dropped off by Daughter for Pt.[employer] packet completed, and $25.00 check left. [name]  May 28, 2010 11:58 AM     Appended Document:  sent all to [facility]

## 2010-08-07 NOTE — Progress Notes (Signed)
Summary: FMLA/[employer]  Phone Note Call from Patient Call back at Home Phone [phone]   Caller: Patient Reason for Call: Talk to Nurse Summary of Call: pt wpould like to know if FMLA/[employer] paper were faxed or sent to insurance today is the last day they can be turn in. Initial call taken by: Roe Coombs,  June 07, 2010 4:08 PM  Follow-up for Phone Call        Faxed FMLA to [phone] [employer]Jeanene Erb pt let her know all faxed have mailed original to pt 06/07/10 Follow-up by: Selena Batten Mesiemore

## 2010-08-09 NOTE — Progress Notes (Signed)
Summary: refill zolpidem  Phone Note Refill Request Message from:  Fax from Pharmacy on July 11, 2010 12:06 PM  Refills Requested: Medication #1:  ZOLPIDEM TARTRATE 10 MG TABS one at bedtime as needed for sleep [facility] home delivery   Method Requested: Fax to Local Pharmacy Initial call taken by: Duard Brady LPN,  July 11, 2010 12:07 PM    Prescriptions: ZOLPIDEM TARTRATE 10 MG TABS (ZOLPIDEM TARTRATE) one at bedtime as needed for sleep  #50 x 2   Entered by:   Duard Brady LPN   Authorized by:   Gordy Savers  MD   Signed by:   Duard Brady LPN on 78/46/9629   Method used:   Historical   RxID:   [id number]  faxed back to [facility]. KIK

## 2010-09-06 ENCOUNTER — Other Ambulatory Visit (INDEPENDENT_AMBULATORY_CARE_PROVIDER_SITE_OTHER): Payer: Managed Care, Other (non HMO) | Admitting: Internal Medicine

## 2010-09-06 DIAGNOSIS — Z0000 Encounter for general adult medical examination without abnormal findings: Secondary | ICD-10-CM

## 2010-09-06 LAB — CBC WITH DIFFERENTIAL/PLATELET
Basophils Absolute: 0.1 10*3/uL (ref 0.0–0.1)
Basophils Relative: 0.9 % (ref 0.0–3.0)
Eosinophils Absolute: 0.5 10*3/uL (ref 0.0–0.7)
Eosinophils Relative: 9.1 % — ABNORMAL HIGH (ref 0.0–5.0)
Lymphocytes Relative: 32.1 % (ref 12.0–46.0)
MCHC: 34.5 g/dL (ref 30.0–36.0)
MCV: 93.3 fl (ref 78.0–100.0)
Monocytes Absolute: 0.3 10*3/uL (ref 0.1–1.0)
Monocytes Relative: 5 % (ref 3.0–12.0)
Neutro Abs: 2.9 10*3/uL (ref 1.4–7.7)
Neutrophils Relative %: 52.9 % (ref 43.0–77.0)
Platelets: 257 10*3/uL (ref 150.0–400.0)
RBC: 3.8 Mil/uL — ABNORMAL LOW (ref 3.87–5.11)
RDW: 13.4 % (ref 11.5–14.6)
WBC: 5.4 10*3/uL (ref 4.5–10.5)

## 2010-09-06 LAB — LIPID PANEL
Cholesterol: 172 mg/dL (ref 0–200)
LDL Cholesterol: 88 mg/dL (ref 0–99)
Total CHOL/HDL Ratio: 3
VLDL: 24.4 mg/dL (ref 0.0–40.0)

## 2010-09-06 LAB — POCT URINALYSIS DIPSTICK
Bilirubin, UA: NEGATIVE
Glucose, UA: NEGATIVE
Ketones, UA: NEGATIVE
Protein, UA: NEGATIVE
Spec Grav, UA: 1.015
Urobilinogen, UA: 0.2
pH, UA: 7

## 2010-09-06 LAB — BASIC METABOLIC PANEL
BUN: 11 mg/dL (ref 6–23)
CO2: 27 mEq/L (ref 19–32)
Calcium: 9.1 mg/dL (ref 8.4–10.5)
Chloride: 105 mEq/L (ref 96–112)
Creatinine, Ser: 1.2 mg/dL (ref 0.4–1.2)
GFR: 50.47 mL/min — ABNORMAL LOW (ref >60.00)
Potassium: 3.9 mEq/L (ref 3.5–5.1)

## 2010-09-06 LAB — HEPATIC FUNCTION PANEL
ALT: 19 U/L (ref 0–35)
AST: 20 U/L (ref 0–37)
Alkaline Phosphatase: 89 U/L (ref 39–117)
Bilirubin, Direct: 0.1 mg/dL (ref 0.0–0.3)
Total Bilirubin: 0.8 mg/dL (ref 0.3–1.2)

## 2010-09-11 ENCOUNTER — Encounter: Payer: Self-pay | Admitting: Internal Medicine

## 2010-09-13 ENCOUNTER — Ambulatory Visit (INDEPENDENT_AMBULATORY_CARE_PROVIDER_SITE_OTHER): Payer: Managed Care, Other (non HMO) | Admitting: Internal Medicine

## 2010-09-13 ENCOUNTER — Encounter: Payer: Self-pay | Admitting: Internal Medicine

## 2010-09-13 DIAGNOSIS — Z0000 Encounter for general adult medical examination without abnormal findings: Secondary | ICD-10-CM

## 2010-09-13 DIAGNOSIS — M359 Systemic involvement of connective tissue, unspecified: Secondary | ICD-10-CM

## 2010-09-13 DIAGNOSIS — E785 Hyperlipidemia, unspecified: Secondary | ICD-10-CM

## 2010-09-13 DIAGNOSIS — I10 Essential (primary) hypertension: Secondary | ICD-10-CM

## 2010-09-13 MED ORDER — ZOLPIDEM TARTRATE 10 MG PO TABS: 10.0000 mg | ORAL_TABLET | Freq: Every evening | ORAL | Status: DC | PRN

## 2010-09-13 MED ORDER — VERAPAMIL HCL 240 MG PO TBCR: 240.0000 mg | EXTENDED_RELEASE_TABLET | Freq: Every day | ORAL | Status: DC

## 2010-09-13 MED ORDER — ATORVASTATIN CALCIUM 40 MG PO TABS: 40.0000 mg | ORAL_TABLET | Freq: Every day | ORAL | Status: DC

## 2010-09-13 MED ORDER — LOPERAMIDE HCL 2 MG PO CAPS: 2.0000 mg | ORAL_CAPSULE | Freq: Four times a day (QID) | ORAL | Status: DC | PRN

## 2010-09-13 NOTE — Progress Notes (Signed)
  Subjective:    Patient ID: [name], female    DOB: 15-Mar-1956, 55 y.o.   MRN: [id number]  HPI   55 year old patient who is seen today for a wellness exam. Medical problems include hyperlipidemia hypertension and collagenous colitis which have all been stable.    Review of Systems  Constitutional: Positive for unexpected weight change. Negative for fever, appetite change and fatigue.  HENT: Negative for hearing loss, ear pain, nosebleeds, congestion, sore throat, mouth sores, trouble swallowing, neck stiffness, dental problem, voice change, sinus pressure and tinnitus.   Eyes: Negative for photophobia, pain, redness and visual disturbance.  Respiratory: Negative for cough, chest tightness and shortness of breath.   Cardiovascular: Negative for chest pain, palpitations and leg swelling.  Gastrointestinal: Positive for diarrhea. Negative for nausea, vomiting, abdominal pain, constipation, blood in stool, abdominal distention and rectal pain.  Genitourinary: Negative for dysuria, urgency, frequency, hematuria, flank pain, vaginal bleeding, vaginal discharge, difficulty urinating, genital sores, vaginal pain, menstrual problem and pelvic pain.  Musculoskeletal: Negative for back pain and arthralgias.  Skin: Negative for rash.  Neurological: Negative for dizziness, syncope, speech difficulty, weakness, light-headedness, numbness and headaches.  Hematological: Negative for adenopathy. Does not bruise/bleed easily.  Psychiatric/Behavioral: Negative for suicidal ideas, behavioral problems, self-injury, dysphoric mood and agitation. The patient is not nervous/anxious.        Objective:   Physical Exam  Constitutional: She is oriented to person, place, and time. She appears well-developed and well-nourished.  HENT:  Head: Normocephalic and atraumatic.  Right Ear: External ear normal.  Left Ear: External ear normal.  Mouth/Throat: Oropharynx is clear and moist.  Eyes: Conjunctivae and EOM  are normal.  Neck: Normal range of motion. Neck supple. No JVD present. No thyromegaly present.  Cardiovascular: Normal rate, regular rhythm, normal heart sounds and intact distal pulses.   No murmur heard. Pulmonary/Chest: Effort normal and breath sounds normal. She has no wheezes. She has no rales.  Abdominal: Soft. Bowel sounds are normal. She exhibits no distension and no mass. There is no tenderness. There is no rebound and no guarding.  Musculoskeletal: Normal range of motion. She exhibits no edema and no tenderness.  Neurological: She is alert and oriented to person, place, and time. She has normal reflexes. No cranial nerve deficit. She exhibits normal muscle tone. Coordination normal.  Skin: Skin is warm and dry. No rash noted.  Psychiatric: She has a normal mood and affect. Her behavior is normal.          Assessment & Plan:   unremarkable clinical exam  Hypertension well controlled  Hyperlipidemia stable   We'll recheck in one year. Medications refilled. Exercise weight loss heart healthy diet all encouraged.

## 2010-09-13 NOTE — Patient Instructions (Signed)
It is important that you exercise regularly, at least 20 minutes 3 to 4 times per week.  If you develop chest pain or shortness of breath seek  medical attention.  You need to lose weight.  Consider a lower calorie diet and regular exercise.  Please check your blood pressure on a regular basis.  If it is consistently greater than 150/90, please make an office appointment.  Return in one year for follow-up  

## 2010-09-18 LAB — CBC
HCT: 38.3 % (ref 36.0–46.0)
Hemoglobin: 12.4 g/dL (ref 12.0–15.0)
MCH: 29.5 pg (ref 26.0–34.0)
MCH: 29.7 pg (ref 26.0–34.0)
MCHC: 32.3 g/dL (ref 30.0–36.0)
MCHC: 32.4 g/dL (ref 30.0–36.0)
MCV: 91.2 fL (ref 78.0–100.0)
Platelets: 229 10*3/uL (ref 150–400)
RDW: 12.8 % (ref 11.5–15.5)
RDW: 12.9 % (ref 11.5–15.5)

## 2010-09-18 LAB — LIPASE, BLOOD: Lipase: 35 U/L (ref 11–59)

## 2010-09-18 LAB — COMPREHENSIVE METABOLIC PANEL
ALT: 29 U/L (ref 0–35)
BUN: 7 mg/dL (ref 6–23)
CO2: 23 mEq/L (ref 19–32)
Calcium: 9.2 mg/dL (ref 8.4–10.5)
GFR calc non Af Amer: >60 mL/min (ref >60)
Glucose, Bld: 88 mg/dL (ref 70–99)
Sodium: 139 mEq/L (ref 135–145)
Total Protein: 7 g/dL (ref 6.0–8.3)

## 2010-09-18 LAB — CK TOTAL AND CKMB (NOT AT ARMC): CK, MB: 1.3 ng/mL (ref 0.3–4.0)

## 2010-09-18 LAB — CARDIAC PANEL(CRET KIN+CKTOT+MB+TROPI)
CK, MB: 1.1 ng/mL (ref 0.3–4.0)
CK, MB: 1.1 ng/mL (ref 0.3–4.0)
Relative Index: INVALID (ref 0.0–2.5)
Relative Index: INVALID (ref 0.0–2.5)
Total CK: 79 U/L (ref 7–177)
Total CK: 81 U/L (ref 7–177)

## 2010-09-18 LAB — BASIC METABOLIC PANEL
Calcium: 9.3 mg/dL (ref 8.4–10.5)
GFR calc Af Amer: >60 mL/min (ref >60)
GFR calc non Af Amer: 57 mL/min — ABNORMAL LOW (ref >60)
Potassium: 4.1 mEq/L (ref 3.5–5.1)
Sodium: 142 mEq/L (ref 135–145)

## 2010-09-18 LAB — D-DIMER, QUANTITATIVE: D-Dimer, Quant: <0.22 ug/mL-FEU (ref 0.00–0.48)

## 2010-09-18 LAB — LIPID PANEL
Cholesterol: 190 mg/dL (ref 0–200)
Total CHOL/HDL Ratio: 3.5 RATIO

## 2010-09-18 LAB — DIFFERENTIAL
Basophils Relative: 1 % (ref 0–1)
Eosinophils Absolute: 0.2 10*3/uL (ref 0.0–0.7)
Lymphs Abs: 2.4 10*3/uL (ref 0.7–4.0)
Monocytes Relative: 5 % (ref 3–12)
Neutro Abs: 4.2 10*3/uL (ref 1.7–7.7)
Neutrophils Relative %: 58 % (ref 43–77)

## 2010-09-18 LAB — TROPONIN I: Troponin I: 0.01 ng/mL (ref 0.00–0.06)

## 2010-09-18 LAB — POCT CARDIAC MARKERS: CKMB, poc: <1 ng/mL — ABNORMAL LOW (ref 1.0–8.0)

## 2010-10-18 LAB — BASIC METABOLIC PANEL
Calcium: 9.1 mg/dL (ref 8.4–10.5)
GFR calc Af Amer: >60 mL/min (ref >60)
GFR calc non Af Amer: >60 mL/min (ref >60)
Glucose, Bld: 89 mg/dL (ref 70–99)
Sodium: 143 mEq/L (ref 135–145)

## 2010-10-18 LAB — URINALYSIS, ROUTINE W REFLEX MICROSCOPIC
Nitrite: NEGATIVE
Specific Gravity, Urine: 1.003 — ABNORMAL LOW (ref 1.005–1.030)
Urobilinogen, UA: 0.2 mg/dL (ref 0.0–1.0)

## 2010-10-18 LAB — URINE MICROSCOPIC-ADD ON

## 2010-10-18 LAB — CBC
Hemoglobin: 13.6 g/dL (ref 12.0–15.0)
RDW: 14 % (ref 11.5–15.5)
WBC: 10.2 10*3/uL (ref 4.0–10.5)

## 2010-11-20 NOTE — Op Note (Signed)
[name], [name]   MEDICAL RECORD NO.:  [id number]          PATIENT TYPE:  AMB   LOCATION:  [facility]                         FACILITY:  [facility]   PHYSICIAN:  [name], M.D.DATE OF BIRTH:  18-May-1956   DATE OF PROCEDURE:  09/21/2008  DATE OF DISCHARGE:                               OPERATIVE REPORT   PREOPERATIVE DIAGNOSIS:  Bladder calculi.   POSTOPERATIVE DIAGNOSIS:  Bladder calculi on suture.   PROCEDURE:  1. Cystourethroscopy.  2. Laser lithotripsy of the intravesicular suture <2.5 cm stone.  3. Cystolitholapaxy.  4. Bladder fulguration.   SURGEON:  [name], M.D.   RESIDENT:  [name], M.D.   ANESTHESIA:  General.   DRAINS:  16-French Foley catheter.   COMPLICATIONS:  None.   ESTIMATED BLOOD LOSS:  Minimal.   INDICATIONS FOR PROCEDURE:  The patient is a 55 year old female who was  evaluated by Dr. Aldean Parker for complaints of gross hematuria.  The  patient on a workup underwent office cystoscopy which revealed a bladder  calculus <2.5 cm along the left lateral bladder wall.  The patient in  the past had a history of [name] operation.  During  cystoscopy, concern was this bladder calculi is on an intravesicular  suture.  The patient was counseled for different treatment options and  was brought in today for cystoscopy and removal of the stone.  Risks and  benefits of the procedure were explained, and informed consent obtained.   DESCRIPTION OF PROCEDURE IN DETAIL:  The patient was brought to the  operating room and placed in the supine position.  Administered general  anesthesia by the anesthesia team.  Appropriate time out was performed,  confirming the patient, procedure and the site.  The patient was given  appropriate preoperative antibiotics.  The patient was subsequently  placed in the dorsal lithotomy position.  Pressure points were well  padded.  Bilateral lower extremity  SCDS were applied.  The patient was  prepped and draped in usual sterile manner.  Using a 22-French sheath  and a 12-degree lens, we then performed cystourethroscopy.  The  patient's urethra was normal.  Upon entering the bladder, left and right  ureteral orifices were found in normal anatomic position.  The rest of  the cystoscopy did not reveal any mass or lesion.  However, along the  left lateral bladder wall near the bladder neck, lateral and distal from  the left ureteral orifice, there was calcification seen of about 1.5-cm  size.  On close inspection, there was a suture across the bladder  mucosa, and this calcification seemed to pass through the suture.  At  that point, we introduced a 5-15 Micron laser fiber, we performed laser  lithotripsy of the loop of the suture material that was coming out of  the bladder wall.  After this, we used flexible forceps and grabbed the  other end of the suture and pulled it out of the mucosa.  We then used  nitinol basket and extracted the stone along with the cystoscope  urethrally.  We then reintroduced the  cystoscope and introduced Bugbee  electrode and made sure the irrigant was sterile water and performed  fulguration of the bladder mucosa from where the stone was removed.  The  area was examined, and there was no active bleeding.  We then drained  patient's bladder.  We then introduced 16-French Foley catheter with  efflux of light pink urine.  This marked the end of the procedure.  The  patient subsequently was extubated and transferred in stable condition  to recovery room.   Of note, Dr. Aldean Parker was present and available for all of the aspects  of the case.     ______________________________  [name], M.D.      [name], M.D.  Electronically Signed    JJ/MEDQ  D:  09/21/2008  T:  09/21/2008  Job:  [id number]

## 2011-02-15 ENCOUNTER — Other Ambulatory Visit: Payer: Self-pay | Admitting: *Deleted

## 2011-02-15 MED ORDER — ZOLPIDEM TARTRATE 10 MG PO TABS: 10.0000 mg | ORAL_TABLET | Freq: Every evening | ORAL | Status: DC | PRN

## 2011-04-05 LAB — BASIC METABOLIC PANEL
BUN: 8
CO2: 25
Calcium: 9
Chloride: 105
Creatinine, Ser: 1.21 — ABNORMAL HIGH
GFR calc Af Amer: 57 — ABNORMAL LOW
GFR calc non Af Amer: 47 — ABNORMAL LOW
Glucose, Bld: 83
Potassium: 3.3 — ABNORMAL LOW
Sodium: 139

## 2011-04-05 LAB — CBC
HCT: 35.8 — ABNORMAL LOW
Hemoglobin: 12.1
MCHC: 33.8
MCV: 94.1
Platelets: 228
RBC: 3.81 — ABNORMAL LOW
RDW: 13.6
WBC: 12.3 — ABNORMAL HIGH

## 2011-04-10 ENCOUNTER — Other Ambulatory Visit: Payer: Self-pay

## 2011-04-10 MED ORDER — ZOLPIDEM TARTRATE 10 MG PO TABS: 10.0000 mg | ORAL_TABLET | Freq: Every evening | ORAL | Status: DC | PRN

## 2011-04-10 NOTE — Telephone Encounter (Signed)
Faxed back to [place] [phone]

## 2011-09-13 ENCOUNTER — Ambulatory Visit: Payer: Managed Care, Other (non HMO) | Admitting: Internal Medicine

## 2011-10-18 ENCOUNTER — Other Ambulatory Visit: Payer: Self-pay | Admitting: Internal Medicine

## 2011-10-18 NOTE — Telephone Encounter (Signed)
Pt needs rx verapamil 240 mg call into [facility]

## 2011-10-21 MED ORDER — VERAPAMIL HCL ER 240 MG PO TBCR: 240.0000 mg | EXTENDED_RELEASE_TABLET | Freq: Every day | ORAL | Status: DC

## 2011-11-20 ENCOUNTER — Other Ambulatory Visit: Payer: Self-pay | Admitting: Internal Medicine

## 2011-11-22 ENCOUNTER — Other Ambulatory Visit (INDEPENDENT_AMBULATORY_CARE_PROVIDER_SITE_OTHER): Payer: 59

## 2011-11-22 DIAGNOSIS — Z0000 Encounter for general adult medical examination without abnormal findings: Secondary | ICD-10-CM

## 2011-11-22 LAB — BASIC METABOLIC PANEL
CO2: 26 mEq/L (ref 19–32)
Chloride: 109 mEq/L (ref 96–112)
Potassium: 3.4 mEq/L — ABNORMAL LOW (ref 3.5–5.1)
Sodium: 143 mEq/L (ref 135–145)

## 2011-11-22 LAB — CBC WITH DIFFERENTIAL/PLATELET
Basophils Relative: 0.6 % (ref 0.0–3.0)
Eosinophils Absolute: 0.3 10*3/uL (ref 0.0–0.7)
Hemoglobin: 13.1 g/dL (ref 12.0–15.0)
Lymphocytes Relative: 31.5 % (ref 12.0–46.0)
MCHC: 33.4 g/dL (ref 30.0–36.0)
MCV: 92.4 fl (ref 78.0–100.0)
Monocytes Absolute: 0.4 10*3/uL (ref 0.1–1.0)
Neutro Abs: 5.3 10*3/uL (ref 1.4–7.7)
RBC: 4.24 Mil/uL (ref 3.87–5.11)

## 2011-11-22 LAB — HEPATIC FUNCTION PANEL
ALT: 17 U/L (ref 0–35)
Albumin: 3.7 g/dL (ref 3.5–5.2)
Alkaline Phosphatase: 74 U/L (ref 39–117)
Total Protein: 6.9 g/dL (ref 6.0–8.3)

## 2011-11-22 LAB — LDL CHOLESTEROL, DIRECT: Direct LDL: 158.7 mg/dL

## 2011-11-22 LAB — POCT URINALYSIS DIPSTICK
Protein, UA: NEGATIVE
Spec Grav, UA: 1.025
Urobilinogen, UA: 0.2
pH, UA: 5.5

## 2011-11-22 LAB — LIPID PANEL
Cholesterol: 239 mg/dL — ABNORMAL HIGH (ref 0–200)
Total CHOL/HDL Ratio: 4

## 2011-11-29 ENCOUNTER — Ambulatory Visit (INDEPENDENT_AMBULATORY_CARE_PROVIDER_SITE_OTHER): Payer: 59 | Admitting: Internal Medicine

## 2011-11-29 ENCOUNTER — Encounter: Payer: Self-pay | Admitting: Internal Medicine

## 2011-11-29 VITALS — BP 100/60 | HR 74 | Temp 97.8°F | Resp 18 | Ht 63.5 in | Wt 134.0 lb

## 2011-11-29 DIAGNOSIS — Z0000 Encounter for general adult medical examination without abnormal findings: Secondary | ICD-10-CM

## 2011-11-29 DIAGNOSIS — E785 Hyperlipidemia, unspecified: Secondary | ICD-10-CM

## 2011-11-29 DIAGNOSIS — I10 Essential (primary) hypertension: Secondary | ICD-10-CM

## 2011-11-29 MED ORDER — CLONAZEPAM 0.5 MG PO TABS: 0.5000 mg | ORAL_TABLET | Freq: Every evening | ORAL | Status: DC | PRN

## 2011-11-29 MED ORDER — TRIAMCINOLONE ACETONIDE 0.1 % EX CREA: TOPICAL_CREAM | Freq: Two times a day (BID) | CUTANEOUS | Status: AC

## 2011-11-29 MED ORDER — HYDROCODONE-ACETAMINOPHEN 5-500 MG PO TABS: 1.0000 | ORAL_TABLET | Freq: Every evening | ORAL | Status: DC | PRN

## 2011-11-29 MED ORDER — ZOLPIDEM TARTRATE 10 MG PO TABS: 10.0000 mg | ORAL_TABLET | Freq: Every evening | ORAL | Status: DC | PRN

## 2011-11-29 MED ORDER — VERAPAMIL HCL ER 240 MG PO TBCR: 240.0000 mg | EXTENDED_RELEASE_TABLET | Freq: Every day | ORAL | Status: DC

## 2011-11-29 MED ORDER — GABAPENTIN 100 MG PO CAPS: 100.0000 mg | ORAL_CAPSULE | Freq: Three times a day (TID) | ORAL | Status: DC

## 2011-11-29 MED ORDER — VENLAFAXINE HCL 37.5 MG PO TABS: 37.5000 mg | ORAL_TABLET | Freq: Two times a day (BID) | ORAL | Status: DC

## 2011-11-29 MED ORDER — ATORVASTATIN CALCIUM 40 MG PO TABS: 40.0000 mg | ORAL_TABLET | Freq: Every day | ORAL | Status: DC

## 2011-11-29 NOTE — Patient Instructions (Signed)
Limit your sodium (Salt) intake    It is important that you exercise regularly, at least 20 minutes 3 to 4 times per week.  If you develop chest pain or shortness of breath seek  medical attention.  Return in 6 months for follow-up  

## 2011-11-29 NOTE — Progress Notes (Signed)
Subjective:    Patient ID: [name], female    DOB: 1956/05/02, 56 y.o.   MRN: [id number]  HPI6 year old patient who is seen today for a preventive health examination. Medical problems include fibromyalgia. She has history depression dyslipidemia and treated hypertension. She has not been taking her atorvastatin regularly. Otherwise doing quite well.  Past Medical History  Diagnosis Date  . 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 DEPRESSIVE DISORDER 05/21/2010  . Diarrhea 11/13/2007  . HYPERLIPIDEMIA 12/13/2008  . HYPERTENSION 04/28/2007  . LOW BACK PAIN 12/02/2007  . NEPHROLITHIASIS, HX OF 04/28/2007  . Renal colic 02/12/2008  . UTI'S, RECURRENT 04/28/2007  . Fibromyalgia   . Migraine   . Bursitis of shoulder   . Bladder stones   . Raynaud disease     History   Social History  . Marital Status: Married    Spouse Name: N/A    Number of Children: N/A  . Years of Education: N/A   Occupational History  . Not on file.   Social History Main Topics  . Smoking status: Former Smoker    Quit date: 07/09/2007  . Smokeless tobacco: Never Used  . Alcohol Use: No  . Drug Use: No  . Sexually Active: Not on file   Other Topics Concern  . Not on file   Social History Narrative  . No narrative on file    Past Surgical History  Procedure Date  . Abdominal hysterectomy 1990    fiboids, prolapse    Family History  Problem Relation Age of Onset  . Hyperlipidemia Mother   . Hypertension Mother     Allergies  Allergen Reactions  . Codeine Sulfate     REACTION: unspecified  . Diphenhydramine Hcl     REACTION: side effect of being irritable  . Moxifloxacin     REACTION: unspecified  . Penicillins     REACTION: unspecified  . Sulfonamide Derivatives   . Tramadol Hcl     REACTION: unspecified    Current Outpatient Prescriptions on File Prior to Visit  Medication Sig Dispense Refill  . aspirin 81 MG tablet Take 81 mg by mouth daily.        . Cholecalciferol (VITAMIN D)  1000 UNITS capsule Take 1,000 Units by mouth daily.        . clonazePAM (KLONOPIN) 0.5 MG tablet Take by mouth. 1/2 to 1 tablet qhs prn       . eletriptan (RELPAX) 40 MG tablet One tablet by mouth as needed for migraine headache.  If the headache improves and then returns, dose may be repeated after 2 hours have elapsed since first dose (do not exceed 80 mg per day). may repeat in 2 hours if necessary       . gabapentin (NEURONTIN) 100 MG capsule Take 100 mg by mouth 3 (three) times daily.        Marland Kitchen HYDROcodone-acetaminophen (VICODIN) 5-500 MG per tablet Take 1 tablet by mouth at bedtime as needed.        . loperamide (IMODIUM) 2 MG capsule Take 1 capsule (2 mg total) by mouth 4 (four) times daily as needed.  30 capsule  6  . promethazine (PHENERGAN) 25 MG tablet Take 25 mg by mouth every 6 (six) hours as needed.        . venlafaxine (EFFEXOR) 37.5 MG tablet Take 37.5 mg by mouth 2 (two) times daily.       . verapamil (CALAN-SR) 240 MG CR tablet TAKE ONE TABLET BY MOUTH AT  BEDTIME  30 tablet  0  . zolpidem (AMBIEN) 10 MG tablet Take 1 tablet (10 mg total) by mouth at bedtime as needed.  60 tablet  1  . atorvastatin (LIPITOR) 40 MG tablet Take 1 tablet (40 mg total) by mouth daily.  90 tablet  6    BP 100/60  Pulse 74  Temp(Src) 97.8 F (36.6 C) (Oral)  Resp 18  Ht 5' 3.5" (1.613 m)  Wt 134 lb (60.782 kg)  BMI 23.36 kg/m2  SpO2 99%      Review of Systems  Constitutional: Negative.   HENT: Negative for hearing loss, congestion, sore throat, rhinorrhea, dental problem, sinus pressure and tinnitus.   Eyes: Negative for pain, discharge and visual disturbance.  Respiratory: Negative for cough and shortness of breath.   Cardiovascular: Negative for chest pain, palpitations and leg swelling.  Gastrointestinal: Negative for nausea, vomiting, abdominal pain, diarrhea, constipation, blood in stool and abdominal distention.  Genitourinary: Negative for dysuria, urgency, frequency, hematuria,  flank pain, vaginal bleeding, vaginal discharge, difficulty urinating, vaginal pain and pelvic pain.  Musculoskeletal: Positive for myalgias. Negative for joint swelling, arthralgias and gait problem.  Skin: Negative for rash.  Neurological: Negative for dizziness, syncope, speech difficulty, weakness, numbness and headaches.  Hematological: Negative for adenopathy.  Psychiatric/Behavioral: Negative for behavioral problems, dysphoric mood and agitation. The patient is not nervous/anxious.        Objective:   Physical Exam  Constitutional: She is oriented to person, place, and time. She appears well-developed and well-nourished.  HENT:  Head: Normocephalic and atraumatic.  Right Ear: External ear normal.  Left Ear: External ear normal.  Mouth/Throat: Oropharynx is clear and moist.  Eyes: Conjunctivae and EOM are normal.  Neck: Normal range of motion. Neck supple. No JVD present. No thyromegaly present.  Cardiovascular: Normal rate, regular rhythm, normal heart sounds and intact distal pulses.   No murmur heard. Pulmonary/Chest: Effort normal and breath sounds normal. She has no wheezes. She has no rales.  Abdominal: Soft. Bowel sounds are normal. She exhibits no distension and no mass. There is no tenderness. There is no rebound and no guarding.  Musculoskeletal: Normal range of motion. She exhibits no edema and no tenderness.  Neurological: She is alert and oriented to person, place, and time. She has normal reflexes. No cranial nerve deficit. She exhibits normal muscle tone. Coordination normal.  Skin: Skin is warm and dry. Rash noted.       Patchy dry flaky dermatitis right lower extremity  Psychiatric: She has a normal mood and affect. Her behavior is normal.          Assessment & Plan:    Preventive health examination Hypertension well controlled Dyslipidemia. Compliance stressed we'll resume atorvastatin  Recheck 6 months

## 2012-01-30 ENCOUNTER — Telehealth: Payer: Self-pay | Admitting: Internal Medicine

## 2012-01-30 ENCOUNTER — Ambulatory Visit: Payer: 59 | Admitting: Family Medicine

## 2012-01-30 DIAGNOSIS — Z0289 Encounter for other administrative examinations: Secondary | ICD-10-CM

## 2012-01-30 NOTE — Telephone Encounter (Signed)
Caller/Bernarda; PCP: Beverely Low.  CB# [phone];  Call regarding Tick Bite on Back; Reports 3" red circle around site of tick bite after embedded tick removed from back.  Onset 01/30/12.  Afebrile.  Chills present with localized rash and > joint aching.  Dr Amador Cunas not in office 01/30/12. No appts remain with other MDs.  Info noted and sent to [facility] for call back ASAP .

## 2012-01-30 NOTE — Telephone Encounter (Signed)
Spoke with pt- advise dr. Amador Cunas out of office until Monday - does need to be seen by another provider - transferred to [facility] to schedule appt tomorrow

## 2012-01-31 ENCOUNTER — Ambulatory Visit (INDEPENDENT_AMBULATORY_CARE_PROVIDER_SITE_OTHER): Payer: 59 | Admitting: Family Medicine

## 2012-01-31 ENCOUNTER — Encounter: Payer: Self-pay | Admitting: Family Medicine

## 2012-01-31 VITALS — BP 118/76 | HR 81 | Temp 98.7°F | Wt 135.0 lb

## 2012-01-31 DIAGNOSIS — T148XXA Other injury of unspecified body region, initial encounter: Secondary | ICD-10-CM

## 2012-01-31 DIAGNOSIS — W57XXXA Bitten or stung by nonvenomous insect and other nonvenomous arthropods, initial encounter: Secondary | ICD-10-CM

## 2012-01-31 DIAGNOSIS — T148 Other injury of unspecified body region: Secondary | ICD-10-CM

## 2012-01-31 MED ORDER — DOXYCYCLINE HYCLATE 100 MG PO CAPS: 100.0000 mg | ORAL_CAPSULE | Freq: Two times a day (BID) | ORAL | Status: AC

## 2012-01-31 MED ORDER — PROMETHAZINE HCL 25 MG PO TABS: 25.0000 mg | ORAL_TABLET | Freq: Four times a day (QID) | ORAL | Status: DC | PRN

## 2012-01-31 NOTE — Progress Notes (Signed)
  Subjective:    Patient ID: [name], female    DOB: [birth year], 56 y.o.   MRN: [id number]  HPI Here for a tick bite that was seen yesterday. This was embedded in her right upper back. Her husband pulled it out with tweezers, but since then the area has gotten red and tender. She has no fevers or systemic symptoms.    Review of Systems  Constitutional: Negative.   Skin: Positive for rash.       Objective:   Physical Exam  Constitutional: She appears well-developed and well-nourished.  Skin:       The right upper back has a small bite wound surrounded by a zone of erythema which is warm and tender           Assessment & Plan:  Cover with Doxycycline for 10 days

## 2012-04-04 ENCOUNTER — Other Ambulatory Visit: Payer: Self-pay | Admitting: Family Medicine

## 2012-05-27 ENCOUNTER — Encounter: Payer: Self-pay | Admitting: Internal Medicine

## 2012-05-27 ENCOUNTER — Ambulatory Visit (INDEPENDENT_AMBULATORY_CARE_PROVIDER_SITE_OTHER): Payer: 59 | Admitting: Internal Medicine

## 2012-05-27 VITALS — BP 110/70 | HR 64 | Temp 97.6°F | Resp 16 | Wt 136.0 lb

## 2012-05-27 DIAGNOSIS — I10 Essential (primary) hypertension: Secondary | ICD-10-CM

## 2012-05-27 DIAGNOSIS — Z23 Encounter for immunization: Secondary | ICD-10-CM

## 2012-05-27 DIAGNOSIS — M545 Low back pain, unspecified: Secondary | ICD-10-CM

## 2012-05-27 DIAGNOSIS — F329 Major depressive disorder, single episode, unspecified: Secondary | ICD-10-CM

## 2012-05-27 DIAGNOSIS — E785 Hyperlipidemia, unspecified: Secondary | ICD-10-CM

## 2012-05-27 DIAGNOSIS — F3289 Other specified depressive episodes: Secondary | ICD-10-CM

## 2012-05-27 MED ORDER — ELETRIPTAN HYDROBROMIDE 40 MG PO TABS: 40.0000 mg | ORAL_TABLET | ORAL | Status: DC | PRN

## 2012-05-27 MED ORDER — VENLAFAXINE HCL 37.5 MG PO TABS: 37.5000 mg | ORAL_TABLET | Freq: Two times a day (BID) | ORAL | Status: DC

## 2012-05-27 MED ORDER — HYDROCODONE-ACETAMINOPHEN 5-500 MG PO TABS: 1.0000 | ORAL_TABLET | Freq: Every evening | ORAL | Status: DC | PRN

## 2012-05-27 MED ORDER — CLONAZEPAM 0.5 MG PO TABS: 0.5000 mg | ORAL_TABLET | Freq: Every evening | ORAL | Status: DC | PRN

## 2012-05-27 MED ORDER — ZOLPIDEM TARTRATE 10 MG PO TABS: 10.0000 mg | ORAL_TABLET | Freq: Every evening | ORAL | Status: DC | PRN

## 2012-05-27 MED ORDER — VERAPAMIL HCL ER 240 MG PO TBCR: 240.0000 mg | EXTENDED_RELEASE_TABLET | Freq: Every day | ORAL | Status: DC

## 2012-05-27 MED ORDER — ATORVASTATIN CALCIUM 40 MG PO TABS: 40.0000 mg | ORAL_TABLET | Freq: Every day | ORAL | Status: DC

## 2012-05-27 MED ORDER — GABAPENTIN 100 MG PO CAPS: 100.0000 mg | ORAL_CAPSULE | Freq: Three times a day (TID) | ORAL | Status: DC

## 2012-05-27 NOTE — Patient Instructions (Signed)
Limit your sodium (Salt) intake    It is important that you exercise regularly, at least 20 minutes 3 to 4 times per week.  If you develop chest pain or shortness of breath seek  medical attention.  Return in 6 months for follow-up  

## 2012-05-27 NOTE — Progress Notes (Signed)
Subjective:    Patient ID: [name], female    DOB: 05/16/1956, 56 y.o.   MRN: [id number]  HPI   8 -year-old patient who is seen today for her six-month followup. She has a history of treated hypertension which has been quite stable. She has dyslipidemia which is treated with Lipitor 40 mg daily she has been compliant with this medication. She has a history depression chronic low back pain and fibromyalgia. She is on a number of control medications and requires medication refills today. She gave herself a trial off Neurontin and had increasing pain. This will be resumed.  Past Medical History  Diagnosis Date  . 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 DEPRESSIVE DISORDER 05/21/2010  . Diarrhea 11/13/2007  . HYPERLIPIDEMIA 12/13/2008  . HYPERTENSION 04/28/2007  . LOW BACK PAIN 12/02/2007  . NEPHROLITHIASIS, HX OF 04/28/2007  . Renal colic 02/12/2008  . UTI'S, RECURRENT 04/28/2007  . Fibromyalgia   . Migraine   . Bursitis of shoulder   . Bladder stones   . Raynaud disease     History   Social History  . Marital Status: Married    Spouse Name: N/A    Number of Children: N/A  . Years of Education: N/A   Occupational History  . Not on file.   Social History Main Topics  . Smoking status: Former Smoker    Quit date: 07/09/2007  . Smokeless tobacco: Never Used  . Alcohol Use: No  . Drug Use: No  . Sexually Active: Not on file   Other Topics Concern  . Not on file   Social History Narrative  . No narrative on file    Past Surgical History  Procedure Date  . Abdominal hysterectomy 1990    fiboids, prolapse    Family History  Problem Relation Age of Onset  . Hyperlipidemia Mother   . Hypertension Mother     Allergies  Allergen Reactions  . Codeine Sulfate     REACTION: unspecified  . Diphenhydramine Hcl     REACTION: side effect of being irritable  . Moxifloxacin     REACTION: unspecified  . Penicillins     REACTION: unspecified  . Sulfonamide Derivatives   .  Tramadol Hcl     REACTION: unspecified    Current Outpatient Prescriptions on File Prior to Visit  Medication Sig Dispense Refill  . aspirin 81 MG tablet Take 81 mg by mouth daily.        Marland Kitchen atorvastatin (LIPITOR) 40 MG tablet Take 1 tablet (40 mg total) by mouth daily.  90 tablet  6  . Cholecalciferol (VITAMIN D) 1000 UNITS capsule Take 1,000 Units by mouth daily.        . clonazePAM (KLONOPIN) 0.5 MG tablet Take 1 tablet (0.5 mg total) by mouth at bedtime as needed for anxiety. 1/2 to 1 tablet qhs prn  60 tablet  3  . eletriptan (RELPAX) 40 MG tablet One tablet by mouth as needed for migraine headache.  If the headache improves and then returns, dose may be repeated after 2 hours have elapsed since first dose (do not exceed 80 mg per day). may repeat in 2 hours if necessary       . HYDROcodone-acetaminophen (VICODIN) 5-500 MG per tablet Take 1 tablet by mouth at bedtime as needed.  60 tablet  2  . loperamide (IMODIUM) 2 MG capsule Take 1 capsule (2 mg total) by mouth 4 (four) times daily as needed.  30 capsule  6  .  promethazine (PHENERGAN) 25 MG tablet TAKE ONE TABLET BY MOUTH EVERY 6 HOURS AS NEEDED FOR NAUSEA  60 tablet  6  . triamcinolone cream (KENALOG) 0.1 % Apply topically 2 (two) times daily.  60 g  1  . venlafaxine (EFFEXOR) 37.5 MG tablet Take 1 tablet (37.5 mg total) by mouth 2 (two) times daily.  180 tablet  6  . verapamil (CALAN-SR) 240 MG CR tablet Take 1 tablet (240 mg total) by mouth at bedtime.  90 tablet  4  . zolpidem (AMBIEN) 10 MG tablet Take 1 tablet (10 mg total) by mouth at bedtime as needed.  60 tablet  1  . gabapentin (NEURONTIN) 100 MG capsule Take 1 capsule (100 mg total) by mouth 3 (three) times daily.  90 capsule  6    BP 110/70  Pulse 64  Temp 97.6 F (36.4 C) (Oral)  Resp 16  Wt 136 lb (61.689 kg)       Review of Systems  Constitutional: Negative.   HENT: Negative for hearing loss, congestion, sore throat, rhinorrhea, dental problem, sinus pressure  and tinnitus.   Eyes: Negative for pain, discharge and visual disturbance.  Respiratory: Negative for cough and shortness of breath.   Cardiovascular: Negative for chest pain, palpitations and leg swelling.  Gastrointestinal: Negative for nausea, vomiting, abdominal pain, diarrhea, constipation, blood in stool and abdominal distention.  Genitourinary: Negative for dysuria, urgency, frequency, hematuria, flank pain, vaginal bleeding, vaginal discharge, difficulty urinating, vaginal pain and pelvic pain.  Musculoskeletal: Positive for back pain and arthralgias. Negative for joint swelling and gait problem.  Skin: Negative for rash.  Neurological: Negative for dizziness, syncope, speech difficulty, weakness, numbness and headaches.  Hematological: Negative for adenopathy.  Psychiatric/Behavioral: Positive for decreased concentration. Negative for behavioral problems, dysphoric mood and agitation. The patient is not nervous/anxious.        Objective:   Physical Exam  Constitutional: She is oriented to person, place, and time. She appears well-developed and well-nourished.  HENT:  Head: Normocephalic.  Right Ear: External ear normal.  Left Ear: External ear normal.  Mouth/Throat: Oropharynx is clear and moist.  Eyes: Conjunctivae normal and EOM are normal. Pupils are equal, round, and reactive to light.  Neck: Normal range of motion. Neck supple. No thyromegaly present.  Cardiovascular: Normal rate, regular rhythm, normal heart sounds and intact distal pulses.   Pulmonary/Chest: Effort normal and breath sounds normal.  Abdominal: Soft. Bowel sounds are normal. She exhibits no mass. There is no tenderness.  Musculoskeletal: Normal range of motion.  Lymphadenopathy:    She has no cervical adenopathy.  Neurological: She is alert and oriented to person, place, and time.  Skin: Skin is warm and dry. No rash noted.  Psychiatric: She has a normal mood and affect. Her behavior is normal.           Assessment & Plan:     Hypertension well controlled Dyslipidemia. Continue Lipitor Fibromyalgia History depression Chronic low back pain  All medications refilled Recheck in 6 months with updated lab

## 2012-05-29 ENCOUNTER — Ambulatory Visit: Payer: 59 | Admitting: Internal Medicine

## 2012-06-11 ENCOUNTER — Other Ambulatory Visit: Payer: Self-pay | Admitting: Internal Medicine

## 2012-08-04 ENCOUNTER — Other Ambulatory Visit: Payer: Self-pay | Admitting: *Deleted

## 2012-08-04 MED ORDER — HYDROCODONE-ACETAMINOPHEN 5-325 MG PO TABS: 1.0000 | ORAL_TABLET | Freq: Four times a day (QID) | ORAL | Status: DC | PRN

## 2012-10-21 ENCOUNTER — Other Ambulatory Visit: Payer: Self-pay | Admitting: *Deleted

## 2012-10-21 MED ORDER — ZOLPIDEM TARTRATE 10 MG PO TABS: 10.0000 mg | ORAL_TABLET | Freq: Every evening | ORAL | Status: DC | PRN

## 2012-11-20 ENCOUNTER — Other Ambulatory Visit: Payer: Self-pay | Admitting: Internal Medicine

## 2012-11-23 ENCOUNTER — Other Ambulatory Visit (INDEPENDENT_AMBULATORY_CARE_PROVIDER_SITE_OTHER): Payer: 59

## 2012-11-23 DIAGNOSIS — Z0000 Encounter for general adult medical examination without abnormal findings: Secondary | ICD-10-CM

## 2012-11-23 LAB — HEPATIC FUNCTION PANEL
ALT: 19 U/L (ref 0–35)
AST: 20 U/L (ref 0–37)
Albumin: 3.8 g/dL (ref 3.5–5.2)
Alkaline Phosphatase: 66 U/L (ref 39–117)

## 2012-11-23 LAB — CBC WITH DIFFERENTIAL/PLATELET
Eosinophils Relative: 8 % — ABNORMAL HIGH (ref 0.0–5.0)
HCT: 38.8 % (ref 36.0–46.0)
Hemoglobin: 13.2 g/dL (ref 12.0–15.0)
Lymphs Abs: 2.8 10*3/uL (ref 0.7–4.0)
Monocytes Relative: 4.7 % (ref 3.0–12.0)
Neutro Abs: 4.3 10*3/uL (ref 1.4–7.7)
RBC: 4.16 Mil/uL (ref 3.87–5.11)
WBC: 8.3 10*3/uL (ref 4.5–10.5)

## 2012-11-23 LAB — LIPID PANEL
HDL: 59.4 mg/dL (ref >39.00)
LDL Cholesterol: 76 mg/dL (ref 0–99)
Total CHOL/HDL Ratio: 2

## 2012-11-23 LAB — TSH: TSH: 1.19 u[IU]/mL (ref 0.35–5.50)

## 2012-11-23 LAB — POCT URINALYSIS DIPSTICK
Bilirubin, UA: NEGATIVE
Nitrite, UA: NEGATIVE
Spec Grav, UA: 1.02
pH, UA: 6

## 2012-11-23 LAB — BASIC METABOLIC PANEL
GFR: 55.39 mL/min — ABNORMAL LOW (ref >60.00)
Potassium: 3.4 mEq/L — ABNORMAL LOW (ref 3.5–5.1)
Sodium: 140 mEq/L (ref 135–145)

## 2012-12-01 ENCOUNTER — Ambulatory Visit (INDEPENDENT_AMBULATORY_CARE_PROVIDER_SITE_OTHER): Payer: 59 | Admitting: Internal Medicine

## 2012-12-01 ENCOUNTER — Encounter: Payer: Self-pay | Admitting: Internal Medicine

## 2012-12-01 VITALS — BP 130/80 | HR 79 | Temp 98.6°F | Resp 20 | Ht 63.75 in | Wt 141.0 lb

## 2012-12-01 DIAGNOSIS — M359 Systemic involvement of connective tissue, unspecified: Secondary | ICD-10-CM

## 2012-12-01 DIAGNOSIS — IMO0001 Reserved for inherently not codable concepts without codable children: Secondary | ICD-10-CM

## 2012-12-01 DIAGNOSIS — M797 Fibromyalgia: Secondary | ICD-10-CM

## 2012-12-01 DIAGNOSIS — Z0000 Encounter for general adult medical examination without abnormal findings: Secondary | ICD-10-CM

## 2012-12-01 MED ORDER — ZOLPIDEM TARTRATE 10 MG PO TABS: 10.0000 mg | ORAL_TABLET | Freq: Every evening | ORAL | Status: DC | PRN

## 2012-12-01 MED ORDER — HYDROCODONE-ACETAMINOPHEN 5-325 MG PO TABS: ORAL_TABLET | ORAL | Status: DC

## 2012-12-01 MED ORDER — CLONAZEPAM 0.5 MG PO TABS: 0.5000 mg | ORAL_TABLET | Freq: Every evening | ORAL | Status: DC | PRN

## 2012-12-01 NOTE — Patient Instructions (Signed)
Limit your sodium (Salt) intake    It is important that you exercise regularly, at least 20 minutes 3 to 4 times per week.  If you develop chest pain or shortness of breath seek  medical attention.  Please check your blood pressure on a regular basis.  If it is consistently greater than 150/90, please make an office appointment.  Return in 6 months for follow-up   

## 2012-12-01 NOTE — Progress Notes (Signed)
Patient ID: [name], female   DOB: June 07, 1956, 57 y.o.   MRN: [id number]  Subjective:    Patient ID: [name], female    DOB: [birth year], 57 y.o.   MRN: [id number]  HPI  57 year-old patient who is seen today for a preventive health examination. Medical problems include fibromyalgia. She has history depression dyslipidemia and treated hypertension. She has not been taking her atorvastatin regularly. Otherwise doing quite well.  Past Medical History  Diagnosis Date  . 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 DEPRESSIVE DISORDER 05/21/2010  . Diarrhea 11/13/2007  . HYPERLIPIDEMIA 12/13/2008  . HYPERTENSION 04/28/2007  . LOW BACK PAIN 12/02/2007  . NEPHROLITHIASIS, HX OF 04/28/2007  . Renal colic 02/12/2008  . UTI'S, RECURRENT 04/28/2007  . Fibromyalgia   . Migraine   . Bursitis of shoulder   . Bladder stones   . Raynaud disease     History   Social History  . Marital Status: Married    Spouse Name: N/A    Number of Children: N/A  . Years of Education: N/A   Occupational History  . Not on file.   Social History Main Topics  . Smoking status: Former Smoker    Quit date: 07/09/2007  . Smokeless tobacco: Never Used  . Alcohol Use: No  . Drug Use: No  . Sexually Active: Not on file   Other Topics Concern  . Not on file   Social History Narrative  . No narrative on file    Past Surgical History  Procedure Laterality Date  . Abdominal hysterectomy  1990    fiboids, prolapse    Family History  Problem Relation Age of Onset  . Hyperlipidemia Mother   . Hypertension Mother     Allergies  Allergen Reactions  . Codeine Sulfate     REACTION: unspecified  . Diphenhydramine Hcl     REACTION: side effect of being irritable  . Moxifloxacin     REACTION: unspecified  . Penicillins     REACTION: unspecified  . Sulfonamide Derivatives   . Tramadol Hcl     REACTION: unspecified    Current Outpatient Prescriptions on File Prior to Visit  Medication Sig Dispense Refill   . aspirin 81 MG tablet Take 81 mg by mouth daily.        Marland Kitchen atorvastatin (LIPITOR) 40 MG tablet Take 1 tablet (40 mg total) by mouth daily.  90 tablet  6  . Cholecalciferol (VITAMIN D) 1000 UNITS capsule Take 1,000 Units by mouth daily.        Marland Kitchen eletriptan (RELPAX) 40 MG tablet One tablet by mouth at onset of headache. May repeat in 2 hours if headache persists or recurs. may repeat in 2 hours if necessary  10 tablet  4  . loperamide (IMODIUM) 2 MG capsule Take 1 capsule (2 mg total) by mouth 4 (four) times daily as needed.  30 capsule  6  . promethazine (PHENERGAN) 25 MG tablet TAKE ONE TABLET BY MOUTH EVERY 6 HOURS AS NEEDED FOR NAUSEA  60 tablet  6  . venlafaxine (EFFEXOR) 37.5 MG tablet Take 1 tablet (37.5 mg total) by mouth 2 (two) times daily.  180 tablet  6  . verapamil (CALAN-SR) 240 MG CR tablet Take 1 tablet (240 mg total) by mouth at bedtime.  90 tablet  4   No current facility-administered medications on file prior to visit.    BP 130/80  Pulse 79  Temp(Src) 98.6 F (37 C) (Oral)  Resp 20  Ht 5' 3.75" (1.619 m)  Wt 141 lb (63.957 kg)  BMI 24.4 kg/m2  SpO2 98%      Review of Systems  Constitutional: Negative.   HENT: Negative for hearing loss, congestion, sore throat, rhinorrhea, dental problem, sinus pressure and tinnitus.   Eyes: Negative for pain, discharge and visual disturbance.  Respiratory: Negative for cough and shortness of breath.   Cardiovascular: Negative for chest pain, palpitations and leg swelling.  Gastrointestinal: Negative for nausea, vomiting, abdominal pain, diarrhea, constipation, blood in stool and abdominal distention.  Genitourinary: Negative for dysuria, urgency, frequency, hematuria, flank pain, vaginal bleeding, vaginal discharge, difficulty urinating, vaginal pain and pelvic pain.  Musculoskeletal: Positive for myalgias. Negative for joint swelling, arthralgias and gait problem.  Skin: Negative for rash.  Neurological: Negative for  dizziness, syncope, speech difficulty, weakness, numbness and headaches.  Hematological: Negative for adenopathy.  Psychiatric/Behavioral: Negative for behavioral problems, dysphoric mood and agitation. The patient is not nervous/anxious.        Objective:   Physical Exam  Constitutional: She is oriented to person, place, and time. She appears well-developed and well-nourished.  HENT:  Head: Normocephalic and atraumatic.  Right Ear: External ear normal.  Left Ear: External ear normal.  Mouth/Throat: Oropharynx is clear and moist.  Eyes: Conjunctivae and EOM are normal.  Neck: Normal range of motion. Neck supple. No JVD present. No thyromegaly present.  Cardiovascular: Normal rate, regular rhythm and normal heart sounds.   No murmur heard. Diminished right dorsalis pedis pulse  Pulmonary/Chest: Effort normal and breath sounds normal. She has no wheezes. She has no rales.  Abdominal: Soft. Bowel sounds are normal. She exhibits no distension and no mass. There is no tenderness. There is no rebound and no guarding.  Musculoskeletal: Normal range of motion. She exhibits no edema and no tenderness.  Neurological: She is alert and oriented to person, place, and time. She has normal reflexes. No cranial nerve deficit. She exhibits normal muscle tone. Coordination normal.  Skin: Skin is warm and dry. Rash noted.  Patchy dry flaky dermatitis right lower extremity  Psychiatric: She has a normal mood and affect. Her behavior is normal.          Assessment & Plan:    Preventive health examination Hypertension well controlled Dyslipidemia.   Recheck 6 months

## 2012-12-23 ENCOUNTER — Other Ambulatory Visit: Payer: Self-pay | Admitting: Internal Medicine

## 2013-04-09 ENCOUNTER — Other Ambulatory Visit: Payer: Self-pay | Admitting: *Deleted

## 2013-04-09 MED ORDER — HYDROCODONE-ACETAMINOPHEN 5-325 MG PO TABS: ORAL_TABLET | ORAL | Status: DC

## 2013-04-14 ENCOUNTER — Other Ambulatory Visit: Payer: Self-pay | Admitting: *Deleted

## 2013-04-14 MED ORDER — PROMETHAZINE HCL 25 MG PO TABS: ORAL_TABLET | ORAL | Status: DC

## 2013-05-13 ENCOUNTER — Other Ambulatory Visit: Payer: Self-pay

## 2013-05-24 ENCOUNTER — Telehealth: Payer: Self-pay | Admitting: Internal Medicine

## 2013-05-24 MED ORDER — HYDROCODONE-ACETAMINOPHEN 5-325 MG PO TABS: ORAL_TABLET | ORAL | Status: DC

## 2013-05-24 NOTE — Telephone Encounter (Signed)
Pt needs new rx hydrocodone °

## 2013-05-24 NOTE — Telephone Encounter (Signed)
Pt notified Rx ready for pickup. Rx printed and signed put at front desk for pick up. 

## 2013-05-28 ENCOUNTER — Other Ambulatory Visit: Payer: Self-pay | Admitting: Internal Medicine

## 2013-06-01 ENCOUNTER — Encounter: Payer: Self-pay | Admitting: Internal Medicine

## 2013-06-02 ENCOUNTER — Ambulatory Visit (INDEPENDENT_AMBULATORY_CARE_PROVIDER_SITE_OTHER): Payer: 59 | Admitting: Internal Medicine

## 2013-06-02 ENCOUNTER — Encounter: Payer: Self-pay | Admitting: Internal Medicine

## 2013-06-02 VITALS — BP 130/80 | HR 85 | Temp 98.2°F | Resp 20 | Wt 161.0 lb

## 2013-06-02 DIAGNOSIS — I10 Essential (primary) hypertension: Secondary | ICD-10-CM

## 2013-06-02 DIAGNOSIS — E785 Hyperlipidemia, unspecified: Secondary | ICD-10-CM

## 2013-06-02 DIAGNOSIS — Z23 Encounter for immunization: Secondary | ICD-10-CM

## 2013-06-02 DIAGNOSIS — M797 Fibromyalgia: Secondary | ICD-10-CM

## 2013-06-02 DIAGNOSIS — F3289 Other specified depressive episodes: Secondary | ICD-10-CM

## 2013-06-02 DIAGNOSIS — IMO0001 Reserved for inherently not codable concepts without codable children: Secondary | ICD-10-CM

## 2013-06-02 DIAGNOSIS — F329 Major depressive disorder, single episode, unspecified: Secondary | ICD-10-CM

## 2013-06-02 MED ORDER — TRIAMCINOLONE ACETONIDE 0.1 % EX CREA: 1.0000 "application " | TOPICAL_CREAM | Freq: Two times a day (BID) | CUTANEOUS | Status: DC

## 2013-06-02 MED ORDER — ZOLPIDEM TARTRATE 10 MG PO TABS: 10.0000 mg | ORAL_TABLET | Freq: Every evening | ORAL | Status: DC | PRN

## 2013-06-02 MED ORDER — HYDROCODONE-ACETAMINOPHEN 5-325 MG PO TABS: ORAL_TABLET | ORAL | Status: DC

## 2013-06-02 MED ORDER — CLONAZEPAM 0.5 MG PO TABS: 0.5000 mg | ORAL_TABLET | Freq: Every evening | ORAL | Status: DC | PRN

## 2013-06-02 NOTE — Progress Notes (Signed)
Pre-visit discussion using our clinic review tool. No additional management support is needed unless otherwise documented below in the visit note.  

## 2013-06-02 NOTE — Progress Notes (Signed)
Subjective:    Patient ID: [name], female    DOB: 10/09/1955, 57 y.o.   MRN: [id number]  HPI Pre-visit discussion using our clinic review tool. No additional management support is needed unless otherwise documented below in the visit note.  Wt Readings from Last 3 Encounters:  06/02/13 161 lb (73.029 kg)  12/01/12 141 lb (63.957 kg)  05/27/12 136 lb (61.55 kg)    57 year old patient who is seen today for followup. She has a history of treated hypertension dyslipidemia and depression. Over the past 6 months she has had a weight gain of 20 pounds. She has been noted by family members to sleep walk during the night and eat during these periods. She states she is unaware of these eating habits but they have been clearly documented. Does use Ambien almost nightly. No other concerns or complaints. She does have a history of fibromyalgia that has been stable. She now works out of her home and probably is not as active.  Past Medical History  Diagnosis Date  . 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 DEPRESSIVE DISORDER 05/21/2010  . Diarrhea 11/13/2007  . HYPERLIPIDEMIA 12/13/2008  . HYPERTENSION 04/28/2007  . LOW BACK PAIN 12/02/2007  . NEPHROLITHIASIS, HX OF 04/28/2007  . Renal colic 02/12/2008  . UTI'S, RECURRENT 04/28/2007  . Fibromyalgia   . Migraine   . Bursitis of shoulder   . Bladder stones   . Raynaud disease     History   Social History  . Marital Status: Married    Spouse Name: N/A    Number of Children: N/A  . Years of Education: N/A   Occupational History  . Not on file.   Social History Main Topics  . Smoking status: Former Smoker    Quit date: 07/09/2007  . Smokeless tobacco: Never Used  . Alcohol Use: No  . Drug Use: No  . Sexual Activity: Not on file   Other Topics Concern  . Not on file   Social History Narrative  . No narrative on file    Past Surgical History  Procedure Laterality Date  . Abdominal hysterectomy  1990    fiboids, prolapse     Family History  Problem Relation Age of Onset  . Hyperlipidemia Mother   . Hypertension Mother     Allergies  Allergen Reactions  . Codeine Sulfate     REACTION: unspecified  . Diphenhydramine Hcl     REACTION: side effect of being irritable  . Moxifloxacin     REACTION: unspecified  . Penicillins     REACTION: unspecified  . Sulfonamide Derivatives   . Tramadol Hcl     REACTION: unspecified    Current Outpatient Prescriptions on File Prior to Visit  Medication Sig Dispense Refill  . aspirin 81 MG tablet Take 81 mg by mouth daily.        Marland Kitchen atorvastatin (LIPITOR) 40 MG tablet Take 1 tablet (40 mg total) by mouth daily.  90 tablet  6  . Cholecalciferol (VITAMIN D) 1000 UNITS capsule Take 1,000 Units by mouth daily.        . clonazePAM (KLONOPIN) 0.5 MG tablet Take 1 tablet (0.5 mg total) by mouth at bedtime as needed for anxiety. 1/2 to 1 tablet qhs prn  60 tablet  3  . eletriptan (RELPAX) 40 MG tablet One tablet by mouth at onset of headache. May repeat in 2 hours if headache persists or recurs. may repeat in 2 hours if necessary  10 tablet  4  .  HYDROcodone-acetaminophen (NORCO/VICODIN) 5-325 MG per tablet TAKE ONE TABLET BY MOUTH EVERY 6 HOURS AS NEEDED FOR PAIN  60 tablet  0  . loperamide (IMODIUM) 2 MG capsule Take 1 capsule (2 mg total) by mouth 4 (four) times daily as needed.  30 capsule  6  . promethazine (PHENERGAN) 25 MG tablet TAKE ONE TABLET BY MOUTH EVERY 6 HOURS AS NEEDED FOR NAUSEA  60 tablet  2  . venlafaxine (EFFEXOR) 37.5 MG tablet Take 1 tablet (37.5 mg total) by mouth 2 (two) times daily.  180 tablet  6  . verapamil (CALAN-SR) 240 MG CR tablet Take 1 tablet (240 mg total) by mouth at bedtime.  90 tablet  4  . zolpidem (AMBIEN) 10 MG tablet Take 1 tablet (10 mg total) by mouth at bedtime as needed.  60 tablet  1   No current facility-administered medications on file prior to visit.    BP 130/80  Pulse 85  Temp(Src) 98.2 F (36.8 C) (Oral)  Resp 20  Wt  161 lb (73.029 kg)  SpO2 98%      Review of Systems  Constitutional: Positive for unexpected weight change.  HENT: Negative for congestion, dental problem, hearing loss, rhinorrhea, sinus pressure, sore throat and tinnitus.   Eyes: Negative for pain, discharge and visual disturbance.  Respiratory: Negative for cough and shortness of breath.   Cardiovascular: Negative for chest pain, palpitations and leg swelling.  Gastrointestinal: Negative for nausea, vomiting, abdominal pain, diarrhea, constipation, blood in stool and abdominal distention.  Genitourinary: Negative for dysuria, urgency, frequency, hematuria, flank pain, vaginal bleeding, vaginal discharge, difficulty urinating, vaginal pain and pelvic pain.  Musculoskeletal: Positive for myalgias. Negative for arthralgias, gait problem and joint swelling.  Skin: Negative for rash.  Neurological: Negative for dizziness, syncope, speech difficulty, weakness, numbness and headaches.  Hematological: Negative for adenopathy.  Psychiatric/Behavioral: Negative for behavioral problems, dysphoric mood and agitation. The patient is not nervous/anxious.        Objective:   Physical Exam  Constitutional: She is oriented to person, place, and time. She appears well-developed and well-nourished.  HENT:  Head: Normocephalic.  Right Ear: External ear normal.  Left Ear: External ear normal.  Mouth/Throat: Oropharynx is clear and moist.  Eyes: Conjunctivae and EOM are normal. Pupils are equal, round, and reactive to light.  Neck: Normal range of motion. Neck supple. No thyromegaly present.  Cardiovascular: Normal rate, regular rhythm, normal heart sounds and intact distal pulses.   Pulmonary/Chest: Effort normal and breath sounds normal.  Abdominal: Soft. Bowel sounds are normal. She exhibits no mass. There is no tenderness.  Musculoskeletal: Normal range of motion.  Lymphadenopathy:    She has no cervical adenopathy.  Neurological: She is  alert and oriented to person, place, and time.  Skin: Skin is warm and dry. No rash noted.  Dry scaly rash inner  aspect of right lower leg and ankle  Psychiatric: She has a normal mood and affect. Her behavior is normal.          Assessment & Plan:   Hypertension. Well controlled Dyslipidemia Weight gain Fibromyalgia stable  Medications updated CPX 6 months

## 2013-06-02 NOTE — Patient Instructions (Signed)
Limit your sodium (Salt) intake    It is important that you exercise regularly, at least 20 minutes 3 to 4 times per week.  If you develop chest pain or shortness of breath seek  medical attention.  You need to lose weight.  Consider a lower calorie diet and regular exercise.  Return in 6 months for follow-up   

## 2013-06-04 ENCOUNTER — Ambulatory Visit: Payer: 59 | Admitting: Internal Medicine

## 2013-06-21 ENCOUNTER — Other Ambulatory Visit: Payer: Self-pay | Admitting: Internal Medicine

## 2013-06-25 ENCOUNTER — Other Ambulatory Visit: Payer: Self-pay | Admitting: Internal Medicine

## 2013-06-25 NOTE — Telephone Encounter (Signed)
[facility] requesting refill of:  venlafaxine (EFFEXOR) 37.5 MG tablet  promethazine (PHENERGAN) 25 MG tablet  zolpidem (AMBIEN) 10 MG tablet

## 2013-06-28 MED ORDER — PROMETHAZINE HCL 25 MG PO TABS: ORAL_TABLET | ORAL | Status: DC

## 2013-06-28 MED ORDER — VENLAFAXINE HCL 37.5 MG PO TABS: ORAL_TABLET | ORAL | Status: DC

## 2013-06-28 MED ORDER — ZOLPIDEM TARTRATE 10 MG PO TABS: 10.0000 mg | ORAL_TABLET | Freq: Every evening | ORAL | Status: DC | PRN

## 2013-06-28 NOTE — Telephone Encounter (Signed)
Faxed to pharmacy

## 2013-06-28 NOTE — Telephone Encounter (Signed)
Patient requesting 90 day with refill, okay for phenergan and ambien?

## 2013-06-28 NOTE — Telephone Encounter (Signed)
Ok to RF? 

## 2013-07-27 ENCOUNTER — Telehealth: Payer: Self-pay | Admitting: Internal Medicine

## 2013-07-27 NOTE — Telephone Encounter (Signed)
Pt requesting refill of HYDROcodone-acetaminophen (NORCO/VICODIN) 5-325 MG per tablet 

## 2013-07-28 MED ORDER — HYDROCODONE-ACETAMINOPHEN 5-325 MG PO TABS: ORAL_TABLET | ORAL | Status: DC

## 2013-07-28 NOTE — Telephone Encounter (Signed)
Spoke to pt told her Rx ready for pickup, will be at the front desk. Pt verbalized understanding. Rx printed and signed. 

## 2013-09-03 ENCOUNTER — Telehealth: Payer: Self-pay | Admitting: Internal Medicine

## 2013-09-03 MED ORDER — HYDROCODONE-ACETAMINOPHEN 5-325 MG PO TABS: ORAL_TABLET | ORAL | Status: DC

## 2013-09-03 NOTE — Telephone Encounter (Signed)
Pt needs re-fill of HYDROcodone-acetaminophen (NORCO/VICODIN) 5-325 MG per tablet ° °

## 2013-09-03 NOTE — Telephone Encounter (Signed)
Rx ready for pick up.  Left message on machine for patient. Patient should go to lab for urine drug test.

## 2013-09-14 ENCOUNTER — Telehealth: Payer: Self-pay | Admitting: Internal Medicine

## 2013-09-14 NOTE — Telephone Encounter (Signed)
[facility] - [place] - [address] requesting re-fills for the following:  clonazePAM (KLONOPIN) 0.5 MG tablet triamcinolone cream (KENALOG) 0.1 %

## 2013-09-14 NOTE — Telephone Encounter (Signed)
Left message on voicemail to call office.  

## 2013-09-15 MED ORDER — TRIAMCINOLONE ACETONIDE 0.1 % EX CREA: 1.0000 "application " | TOPICAL_CREAM | Freq: Two times a day (BID) | CUTANEOUS | Status: DC

## 2013-09-15 NOTE — Telephone Encounter (Signed)
Spoke to pt told her can only send Kenalog cream to [facility] due to Rx only for 60 tablets at a time. Pt verbalized understanding and stated that is fine, just had refilled at local pharmacy. Told her okay.

## 2013-10-06 ENCOUNTER — Encounter: Payer: Self-pay | Admitting: Internal Medicine

## 2013-10-11 ENCOUNTER — Telehealth: Payer: Self-pay

## 2013-10-11 MED ORDER — HYDROCODONE-ACETAMINOPHEN 5-325 MG PO TABS: ORAL_TABLET | ORAL | Status: DC

## 2013-10-11 NOTE — Telephone Encounter (Signed)
Pt req rx HYDROcodone-acetaminophen (NORCO/VICODIN) 5-325 MG per tablet ° °

## 2013-10-11 NOTE — Telephone Encounter (Signed)
Pt notified Rx ready for pickup. Rx printed and signed.  

## 2013-10-12 NOTE — Telephone Encounter (Signed)
Error // ck °

## 2013-10-18 ENCOUNTER — Other Ambulatory Visit: Payer: Self-pay | Admitting: Internal Medicine

## 2013-10-19 ENCOUNTER — Telehealth: Payer: Self-pay | Admitting: Internal Medicine

## 2013-10-19 NOTE — Telephone Encounter (Signed)
[facility] - [place], [place] - [address] is requesting re-fill on clonazePAM (KLONOPIN) 0.5 MG tablet ° °

## 2013-10-20 MED ORDER — CLONAZEPAM 0.5 MG PO TABS: 0.5000 mg | ORAL_TABLET | Freq: Every evening | ORAL | Status: DC | PRN

## 2013-10-20 NOTE — Telephone Encounter (Signed)
[facility]

## 2013-11-01 ENCOUNTER — Other Ambulatory Visit: Payer: Self-pay | Admitting: Internal Medicine

## 2013-11-09 ENCOUNTER — Telehealth: Payer: Self-pay | Admitting: Internal Medicine

## 2013-11-09 NOTE — Telephone Encounter (Signed)
Pt need new rx hydrocodone °

## 2013-11-10 MED ORDER — HYDROCODONE-ACETAMINOPHEN 5-325 MG PO TABS: ORAL_TABLET | ORAL | Status: DC

## 2013-11-10 NOTE — Telephone Encounter (Signed)
Pt notified Rx ready for pickup. Rx printed and signed.  

## 2013-11-23 ENCOUNTER — Other Ambulatory Visit (INDEPENDENT_AMBULATORY_CARE_PROVIDER_SITE_OTHER): Payer: 59

## 2013-11-23 DIAGNOSIS — Z0000 Encounter for general adult medical examination without abnormal findings: Secondary | ICD-10-CM

## 2013-11-23 LAB — HEPATIC FUNCTION PANEL
ALK PHOS: 73 U/L (ref 39–117)
ALT: 19 U/L (ref 0–35)
AST: 21 U/L (ref 0–37)
Albumin: 3.8 g/dL (ref 3.5–5.2)
BILIRUBIN DIRECT: 0 mg/dL (ref 0.0–0.3)
TOTAL PROTEIN: 7 g/dL (ref 6.0–8.3)
Total Bilirubin: 0.6 mg/dL (ref 0.2–1.2)

## 2013-11-23 LAB — CBC WITH DIFFERENTIAL/PLATELET
Basophils Absolute: 0.1 10*3/uL (ref 0.0–0.1)
Basophils Relative: 1 % (ref 0.0–3.0)
EOS ABS: 0.3 10*3/uL (ref 0.0–0.7)
Eosinophils Relative: 4.7 % (ref 0.0–5.0)
HCT: 39.5 % (ref 36.0–46.0)
HEMOGLOBIN: 13.3 g/dL (ref 12.0–15.0)
LYMPHS PCT: 43.1 % (ref 12.0–46.0)
Lymphs Abs: 2.7 10*3/uL (ref 0.7–4.0)
MCHC: 33.6 g/dL (ref 30.0–36.0)
MCV: 90 fl (ref 78.0–100.0)
MONOS PCT: 6.7 % (ref 3.0–12.0)
Monocytes Absolute: 0.4 10*3/uL (ref 0.1–1.0)
NEUTROS ABS: 2.8 10*3/uL (ref 1.4–7.7)
NEUTROS PCT: 44.5 % (ref 43.0–77.0)
Platelets: 317 10*3/uL (ref 150.0–400.0)
RBC: 4.39 Mil/uL (ref 3.87–5.11)
RDW: 13.4 % (ref 11.5–15.5)
WBC: 6.4 10*3/uL (ref 4.0–10.5)

## 2013-11-23 LAB — BASIC METABOLIC PANEL
BUN: 12 mg/dL (ref 6–23)
CALCIUM: 8.9 mg/dL (ref 8.4–10.5)
CO2: 25 meq/L (ref 19–32)
CREATININE: 1.1 mg/dL (ref 0.4–1.2)
Chloride: 107 mEq/L (ref 96–112)
GFR: 55.78 mL/min — ABNORMAL LOW (ref >60.00)
Glucose, Bld: 85 mg/dL (ref 70–99)
Potassium: 3.2 mEq/L — ABNORMAL LOW (ref 3.5–5.1)
SODIUM: 140 meq/L (ref 135–145)

## 2013-11-23 LAB — POCT URINALYSIS DIPSTICK
Bilirubin, UA: NEGATIVE
Glucose, UA: NEGATIVE
KETONES UA: NEGATIVE
Leukocytes, UA: NEGATIVE
Nitrite, UA: NEGATIVE
Protein, UA: NEGATIVE
SPEC GRAV UA: 1.015
Urobilinogen, UA: 0.2
pH, UA: 6

## 2013-11-23 LAB — LIPID PANEL
CHOL/HDL RATIO: 3
Cholesterol: 172 mg/dL (ref 0–200)
HDL: 56.6 mg/dL (ref >39.00)
LDL CALC: 90 mg/dL (ref 0–99)
TRIGLYCERIDES: 127 mg/dL (ref 0.0–149.0)
VLDL: 25.4 mg/dL (ref 0.0–40.0)

## 2013-11-23 LAB — TSH: TSH: 0.43 u[IU]/mL (ref 0.35–4.50)

## 2013-11-30 ENCOUNTER — Encounter: Payer: Self-pay | Admitting: Internal Medicine

## 2013-11-30 ENCOUNTER — Ambulatory Visit (INDEPENDENT_AMBULATORY_CARE_PROVIDER_SITE_OTHER): Payer: 59 | Admitting: Internal Medicine

## 2013-11-30 VITALS — BP 114/70 | HR 85 | Temp 98.5°F | Ht 65.75 in | Wt 172.0 lb

## 2013-11-30 DIAGNOSIS — M545 Low back pain, unspecified: Secondary | ICD-10-CM

## 2013-11-30 DIAGNOSIS — I10 Essential (primary) hypertension: Secondary | ICD-10-CM

## 2013-11-30 DIAGNOSIS — E785 Hyperlipidemia, unspecified: Secondary | ICD-10-CM

## 2013-11-30 DIAGNOSIS — Z0000 Encounter for general adult medical examination without abnormal findings: Secondary | ICD-10-CM

## 2013-11-30 DIAGNOSIS — M797 Fibromyalgia: Secondary | ICD-10-CM

## 2013-11-30 DIAGNOSIS — IMO0001 Reserved for inherently not codable concepts without codable children: Secondary | ICD-10-CM

## 2013-11-30 DIAGNOSIS — Z87442 Personal history of urinary calculi: Secondary | ICD-10-CM

## 2013-11-30 MED ORDER — POTASSIUM CHLORIDE CRYS ER 20 MEQ PO TBCR: 20.0000 meq | EXTENDED_RELEASE_TABLET | Freq: Two times a day (BID) | ORAL | Status: DC

## 2013-11-30 NOTE — Patient Instructions (Signed)
Limit your sodium (Salt) intake    It is important that you exercise regularly, at least 20 minutes 3 to 4 times per week.  If you develop chest pain or shortness of breath seek  medical attention.  Please check your blood pressure on a regular basis.  If it is consistently greater than 150/90, please make an office appointment.  You need to lose weight.  Consider a lower calorie diet and regular exercise.  Return in 6 months for follow-up  

## 2013-11-30 NOTE — Progress Notes (Signed)
Patient ID: [name], female   DOB: Sep 26, 1955, 58 y.o.   MRN: [id number]  Subjective:    Patient ID: [name], female    DOB: Jul 12, 1955, 58 y.o.   MRN: [id number]  HPI 58 year-old patient who is seen today for a preventive health examination.  Medical problems include fibromyalgia. She has history depression dyslipidemia and treated hypertension.   Past History:  Past Medical History: Hypertension Nephrolithiasis, hx of recurrent UTI's fibromyalgia, and chronic fatigue migraine headaches menopausal syndrome Low back pain history of concussion, 1999 Raynoud's collagenous colitis Hyperlipidemia Vitamin D deficiency bladder stones bursitis in shoulder  Family History: Reviewed history from 04/27/2009 and no changes required. father died of suicide death at 35.  History of dementia Family History of CAD Female 1st degree relative <50 Family History Hypertension: Father/ mother Family History of Stroke F 1st degree relative <60 Mother:   HTN, Osteoporosis PD age 36 History  of Inflammatory Bowel Disease: Brother with Crohn's No FH of Colon Cancer 4 brothers, one sister: h/o hepatitis C  Social History: Reviewed history from 04/27/2009 and no changes required. Married, grown children Illicit Drug Use - no Alcohol Use - no Patient gets regular exercise. walks 30 min a day 4-5 days a week  Daily Caffeine Use small cup of cofee  [employer] Patient is a former smoker. stopped 8/09 Smoking Status:  never  Colonoscopy 2009    Past Medical History  Diagnosis Date  . 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 DEPRESSIVE DISORDER 05/21/2010  . Diarrhea 11/13/2007  . HYPERLIPIDEMIA 12/13/2008  . HYPERTENSION 04/28/2007  . LOW BACK PAIN 12/02/2007  . NEPHROLITHIASIS, HX OF 04/28/2007  . Renal colic 02/12/2008  . UTI'S, RECURRENT 04/28/2007  . Fibromyalgia   . Migraine   . Bursitis of shoulder   . Bladder stones   . Raynaud disease     History   Social History  . Marital  Status: Married    Spouse Name: N/A    Number of Children: N/A  . Years of Education: N/A   Occupational History  . Not on file.   Social History Main Topics  . Smoking status: Former Smoker    Quit date: 07/09/2007  . Smokeless tobacco: Never Used  . Alcohol Use: No  . Drug Use: No  . Sexual Activity: Not on file   Other Topics Concern  . Not on file   Social History Narrative  . No narrative on file    Past Surgical History  Procedure Laterality Date  . Abdominal hysterectomy  1990    fiboids, prolapse    Family History  Problem Relation Age of Onset  . Hyperlipidemia Mother   . Hypertension Mother     Allergies  Allergen Reactions  . Codeine Sulfate     REACTION: unspecified  . Diphenhydramine Hcl     REACTION: side effect of being irritable  . Moxifloxacin     REACTION: unspecified  . Penicillins     REACTION: unspecified  . Sulfonamide Derivatives   . Tramadol Hcl     REACTION: unspecified    Current Outpatient Prescriptions on File Prior to Visit  Medication Sig Dispense Refill  . aspirin 81 MG tablet Take 81 mg by mouth daily.        Marland Kitchen atorvastatin (LIPITOR) 40 MG tablet Take 1 tablet by mouth  daily  90 tablet  1  . Cholecalciferol (VITAMIN D) 1000 UNITS capsule Take 1,000 Units by mouth daily.        Marland Kitchen  clonazePAM (KLONOPIN) 0.5 MG tablet Take 1 tablet (0.5 mg total) by mouth at bedtime as needed for anxiety. 1/2 to 1 tablet qhs prn  180 tablet  1  . eletriptan (RELPAX) 40 MG tablet One tablet by mouth at onset of headache. May repeat in 2 hours if headache persists or recurs. may repeat in 2 hours if necessary  10 tablet  4  . HYDROcodone-acetaminophen (NORCO/VICODIN) 5-325 MG per tablet TAKE ONE TABLET BY MOUTH EVERY 6 HOURS AS NEEDED FOR PAIN  60 tablet  0  . loperamide (IMODIUM) 2 MG capsule Take 1 capsule (2 mg total) by mouth 4 (four) times daily as needed.  30 capsule  6  . promethazine (PHENERGAN) 25 MG tablet Take 1 tablet by mouth   every 6  hours as needed for nausea  180 tablet  1  . triamcinolone cream (KENALOG) 0.1 % Apply 1 application topically 2 (two) times daily.  255.6 g  1  . venlafaxine (EFFEXOR) 37.5 MG tablet Take 1 tablet (37.5 mg total) by mouth 2 (two) times daily.  180 tablet  6  . venlafaxine (EFFEXOR) 37.5 MG tablet TAKE ONE TABLET BY MOUTH TWICE DAILY  180 tablet  1  . venlafaxine (EFFEXOR) 37.5 MG tablet Take 1 tablet by mouth two  times daily  180 tablet  1  . verapamil (CALAN-SR) 240 MG CR tablet Take 1 tablet by mouth at  bedtime  90 tablet  1  . zolpidem (AMBIEN) 10 MG tablet Take 1 tablet (10 mg total) by mouth at bedtime as needed.  90 tablet  1   No current facility-administered medications on file prior to visit.    There were no vitals taken for this visit.      Review of Systems  Constitutional: Negative.   HENT: Negative for congestion, dental problem, hearing loss, rhinorrhea, sinus pressure, sore throat and tinnitus.   Eyes: Negative for pain, discharge and visual disturbance.  Respiratory: Negative for cough and shortness of breath.   Cardiovascular: Negative for chest pain, palpitations and leg swelling.  Gastrointestinal: Negative for nausea, vomiting, abdominal pain, diarrhea, constipation, blood in stool and abdominal distention.  Genitourinary: Negative for dysuria, urgency, frequency, hematuria, flank pain, vaginal bleeding, vaginal discharge, difficulty urinating, vaginal pain and pelvic pain.  Musculoskeletal: Positive for myalgias. Negative for arthralgias, gait problem and joint swelling.  Skin: Negative for rash.  Neurological: Negative for dizziness, syncope, speech difficulty, weakness, numbness and headaches.  Hematological: Negative for adenopathy.  Psychiatric/Behavioral: Negative for behavioral problems, dysphoric mood and agitation. The patient is not nervous/anxious.        Objective:   Physical Exam  Constitutional: She is oriented to person, place, and time. She  appears well-developed and well-nourished.  HENT:  Head: Normocephalic and atraumatic.  Right Ear: External ear normal.  Left Ear: External ear normal.  Mouth/Throat: Oropharynx is clear and moist.  Eyes: Conjunctivae and EOM are normal.  Neck: Normal range of motion. Neck supple. No JVD present. No thyromegaly present.  Cardiovascular: Normal rate, regular rhythm and normal heart sounds.   No murmur heard. Diminished right dorsalis pedis pulse and  left posterior tibial pulse  Pulmonary/Chest: Effort normal and breath sounds normal. She has no wheezes. She has no rales.  Abdominal: Soft. Bowel sounds are normal. She exhibits no distension and no mass. There is no tenderness. There is no rebound and no guarding.  Musculoskeletal: Normal range of motion. She exhibits no edema and no tenderness.  Neurological: She is  alert and oriented to person, place, and time. She has normal reflexes. No cranial nerve deficit. She exhibits normal muscle tone. Coordination normal.  Skin: Skin is warm and dry. Rash noted.  Psychiatric: She has a normal mood and affect. Her behavior is normal.          Assessment & Plan:    Preventive health examination Hypertension well controlled Dyslipidemia.  Mild hypokalemia.  Will supplement.  Possibly related to diarrhea  Recheck 6 months

## 2013-11-30 NOTE — Progress Notes (Signed)
Pre visit review using our clinic review tool, if applicable. No additional management support is needed unless otherwise documented below in the visit note. 

## 2013-12-01 ENCOUNTER — Telehealth: Payer: Self-pay | Admitting: Internal Medicine

## 2013-12-01 NOTE — Telephone Encounter (Signed)
Relevant patient education mailed to patient.  

## 2013-12-06 ENCOUNTER — Telehealth: Payer: Self-pay | Admitting: Internal Medicine

## 2013-12-06 NOTE — Telephone Encounter (Signed)
Pt needs new rx hydrocodone °

## 2013-12-08 MED ORDER — HYDROCODONE-ACETAMINOPHEN 5-325 MG PO TABS: ORAL_TABLET | ORAL | Status: DC

## 2013-12-08 NOTE — Telephone Encounter (Signed)
Left detailed message Rx ready for pickup. Rx printed and signed. 

## 2014-01-05 ENCOUNTER — Telehealth: Payer: Self-pay | Admitting: Internal Medicine

## 2014-01-05 NOTE — Telephone Encounter (Signed)
Pt needs new rx hydrocodone °

## 2014-01-06 MED ORDER — HYDROCODONE-ACETAMINOPHEN 5-325 MG PO TABS: ORAL_TABLET | ORAL | Status: DC

## 2014-01-06 NOTE — Telephone Encounter (Signed)
Pt notified Rx ready for pickup. Rx printed and signed.  

## 2014-01-10 ENCOUNTER — Other Ambulatory Visit: Payer: Self-pay | Admitting: Internal Medicine

## 2014-01-13 ENCOUNTER — Other Ambulatory Visit: Payer: Self-pay | Admitting: Internal Medicine

## 2014-02-03 ENCOUNTER — Other Ambulatory Visit: Payer: Self-pay | Admitting: Internal Medicine

## 2014-02-04 ENCOUNTER — Telehealth: Payer: Self-pay | Admitting: Internal Medicine

## 2014-02-04 NOTE — Telephone Encounter (Signed)
[facility] - [place] - [address] is requesting re-fills on the following: clonazePAM (KLONOPIN) 0.5 MG tablet zolpidem (AMBIEN) 10 MG tablet

## 2014-02-07 ENCOUNTER — Telehealth: Payer: Self-pay | Admitting: Internal Medicine

## 2014-02-07 MED ORDER — ZOLPIDEM TARTRATE 10 MG PO TABS: 10.0000 mg | ORAL_TABLET | Freq: Every evening | ORAL | Status: DC | PRN

## 2014-02-07 MED ORDER — CLONAZEPAM 0.5 MG PO TABS: 0.5000 mg | ORAL_TABLET | Freq: Every evening | ORAL | Status: DC | PRN

## 2014-02-07 NOTE — Telephone Encounter (Signed)
Pt need re-fill of HYDROcodone-acetaminophen (NORCO/VICODIN) 5-325 MG per tablet

## 2014-02-07 NOTE — Telephone Encounter (Signed)
[facility]

## 2014-02-08 MED ORDER — HYDROCODONE-ACETAMINOPHEN 5-325 MG PO TABS: ORAL_TABLET | ORAL | Status: DC

## 2014-02-08 NOTE — Telephone Encounter (Signed)
Left detailed message on personal voicemail Rx ready for pickup. Rx printed and signed.  

## 2014-03-08 ENCOUNTER — Telehealth: Payer: Self-pay | Admitting: Internal Medicine

## 2014-03-08 NOTE — Telephone Encounter (Signed)
Pt request refill of the following: °HYDROcodone-acetaminophen (NORCO/VICODIN) 5-325 MG per tablet ° ° °Phamacy:    PICK UP  ° °

## 2014-03-09 MED ORDER — HYDROCODONE-ACETAMINOPHEN 5-325 MG PO TABS: ORAL_TABLET | ORAL | Status: DC

## 2014-03-09 NOTE — Telephone Encounter (Signed)
Pt notified Rx ready for pickup. Rx printed and signed.  

## 2014-03-31 ENCOUNTER — Other Ambulatory Visit: Payer: Self-pay | Admitting: Internal Medicine

## 2014-04-06 ENCOUNTER — Telehealth: Payer: Self-pay | Admitting: Internal Medicine

## 2014-04-06 MED ORDER — VERAPAMIL HCL ER 240 MG PO TBCR: EXTENDED_RELEASE_TABLET | ORAL | Status: DC

## 2014-04-06 MED ORDER — ATORVASTATIN CALCIUM 40 MG PO TABS: ORAL_TABLET | ORAL | Status: DC

## 2014-04-06 NOTE — Telephone Encounter (Signed)
Pt request refill  verapamil (CALAN-SR) 240 MG CR tablet atorvastatin (LIPITOR) 40 MG tablet Pt had cpe in may and thought these were sent in then. Now pt is out and needs asap [facility]

## 2014-04-06 NOTE — Telephone Encounter (Signed)
Spoke to pt, told [facility] but I would suggest she get short supply from local pharmacy. Pt verbalized understanding and stated okay. Told her will send 10 day supply of each. Pt verbalized understanding. Rx's sent.

## 2014-04-07 ENCOUNTER — Telehealth: Payer: Self-pay | Admitting: Internal Medicine

## 2014-04-07 MED ORDER — HYDROCODONE-ACETAMINOPHEN 5-325 MG PO TABS: ORAL_TABLET | ORAL | Status: DC

## 2014-04-07 NOTE — Telephone Encounter (Signed)
Ok per Dr K, rx up front for p/u, pt aware 

## 2014-04-07 NOTE — Telephone Encounter (Signed)
Pt request refill HYDROcodone-acetaminophen (NORCO/VICODIN) 5-325 MG per tablet °

## 2014-04-18 ENCOUNTER — Other Ambulatory Visit: Payer: Self-pay | Admitting: Internal Medicine

## 2014-05-05 ENCOUNTER — Telehealth: Payer: Self-pay | Admitting: Internal Medicine

## 2014-05-05 MED ORDER — HYDROCODONE-ACETAMINOPHEN 5-325 MG PO TABS: ORAL_TABLET | ORAL | Status: DC

## 2014-05-05 NOTE — Telephone Encounter (Signed)
Pt request refill HYDROcodone-acetaminophen (NORCO/VICODIN) 5-325 MG per tablet °

## 2014-05-05 NOTE — Telephone Encounter (Signed)
Left detailed message Rx ready for pickup, will be at the front desk. Note put on envelope of Rx  pt needs Urine Drug screen, go to the lab per Dr. Kirtland BouchardK. Rx printed and signed.

## 2014-05-24 ENCOUNTER — Ambulatory Visit (INDEPENDENT_AMBULATORY_CARE_PROVIDER_SITE_OTHER): Payer: 59 | Admitting: Internal Medicine

## 2014-05-24 ENCOUNTER — Ambulatory Visit (INDEPENDENT_AMBULATORY_CARE_PROVIDER_SITE_OTHER): Payer: 59 | Admitting: *Deleted

## 2014-05-24 ENCOUNTER — Encounter: Payer: Self-pay | Admitting: Internal Medicine

## 2014-05-24 VITALS — BP 120/80 | HR 88 | Temp 98.6°F | Resp 20 | Ht 65.75 in | Wt 172.0 lb

## 2014-05-24 DIAGNOSIS — Z23 Encounter for immunization: Secondary | ICD-10-CM

## 2014-05-24 DIAGNOSIS — M545 Low back pain, unspecified: Secondary | ICD-10-CM

## 2014-05-24 DIAGNOSIS — E785 Hyperlipidemia, unspecified: Secondary | ICD-10-CM

## 2014-05-24 DIAGNOSIS — I10 Essential (primary) hypertension: Secondary | ICD-10-CM

## 2014-05-24 MED ORDER — VENLAFAXINE HCL 37.5 MG PO TABS: ORAL_TABLET | ORAL | Status: DC

## 2014-05-24 MED ORDER — ATORVASTATIN CALCIUM 40 MG PO TABS: ORAL_TABLET | ORAL | Status: DC

## 2014-05-24 MED ORDER — VERAPAMIL HCL ER 240 MG PO TBCR: EXTENDED_RELEASE_TABLET | ORAL | Status: DC

## 2014-05-24 NOTE — Patient Instructions (Signed)
Limit your sodium (Salt) intake    It is important that you exercise regularly, at least 20 minutes 3 to 4 times per week.  If you develop chest pain or shortness of breath seek  medical attention.  Please check your blood pressure on a regular basis.  If it is consistently greater than 150/90, please make an office appointment.  Return in 6 months for follow-up   

## 2014-05-24 NOTE — Progress Notes (Signed)
Pre visit review using our clinic review tool, if applicable. No additional management support is needed unless otherwise documented below in the visit note. 

## 2014-05-24 NOTE — Progress Notes (Signed)
Subjective:    Patient ID: [name], female    DOB: 02/06/1956, 58 y.o.   MRN: [id number]  HPI 58 year old patient who has a history of fibromyalgia, low back pain.  She has done well on her present regimen.  She has been evaluated by rheumatology and also behavioral health in the past as well as the headache clinic. She has tried a number of medications including Lyrica, Topamax and tramadol.  She has done quite well recently. A urine drug screen earlier this year, cost  the patient 900 dollars out of pocket.  Due to her high deductible plan. No concerns or complaints She has a history of hypertension and dyslipidemia, well controlled.  Past Medical History  Diagnosis Date  . 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 DEPRESSIVE DISORDER 05/21/2010  . Diarrhea 11/13/2007  . HYPERLIPIDEMIA 12/13/2008  . HYPERTENSION 04/28/2007  . LOW BACK PAIN 12/02/2007  . NEPHROLITHIASIS, HX OF 04/28/2007  . Renal colic 02/12/2008  . UTI'S, RECURRENT 04/28/2007  . Fibromyalgia   . Migraine   . Bursitis of shoulder   . Bladder stones   . Raynaud disease     History   Social History  . Marital Status: Married    Spouse Name: N/A    Number of Children: N/A  . Years of Education: N/A   Occupational History  . Not on file.   Social History Main Topics  . Smoking status: Former Smoker    Quit date: 07/09/2007  . Smokeless tobacco: Never Used  . Alcohol Use: No  . Drug Use: No  . Sexual Activity: Not on file   Other Topics Concern  . Not on file   Social History Narrative    Past Surgical History  Procedure Laterality Date  . Abdominal hysterectomy  1990    fiboids, prolapse    Family History  Problem Relation Age of Onset  . Hyperlipidemia Mother   . Hypertension Mother     Allergies  Allergen Reactions  . Codeine Sulfate     REACTION: unspecified  . Diphenhydramine Hcl     REACTION: side effect of being irritable  . Moxifloxacin     REACTION: unspecified  . Penicillins     REACTION: unspecified  . Sulfonamide Derivatives   . Tramadol Hcl     REACTION: unspecified    Current Outpatient Prescriptions on File Prior to Visit  Medication Sig Dispense Refill  . aspirin 81 MG tablet Take 81 mg by mouth daily.      Marland Kitchen. atorvastatin (LIPITOR) 40 MG tablet Take 1 tablet by mouth  daily 10 tablet 0  . Cholecalciferol (VITAMIN D) 1000 UNITS capsule Take 1,000 Units by mouth daily.      . clonazePAM (KLONOPIN) 0.5 MG tablet Take 1 tablet (0.5 mg total) by mouth at bedtime as needed for anxiety. 180 tablet 1  . HYDROcodone-acetaminophen (NORCO/VICODIN) 5-325 MG per tablet TAKE ONE TABLET BY MOUTH EVERY 6 HOURS AS NEEDED FOR PAIN 60 tablet 0  . loperamide (IMODIUM) 2 MG capsule Take 1 capsule (2 mg total) by mouth 4 (four) times daily as needed. 30 capsule 6  . potassium chloride SA (K-DUR,KLOR-CON) 20 MEQ tablet Take 1 tablet (20 mEq total) by mouth 2 (two) times daily. 90 tablet 5  . promethazine (PHENERGAN) 25 MG tablet Take 1 tablet by mouth  every 6 hours as needed for nausea 360 tablet 1  . venlafaxine (EFFEXOR) 37.5 MG tablet TAKE ONE TABLET BY MOUTH TWICE DAILY 180 tablet 1  .  verapamil (CALAN-SR) 240 MG CR tablet Take 1 tablet by mouth at  bedtime 10 tablet 0  . zolpidem (AMBIEN) 10 MG tablet Take 1 tablet (10 mg total) by mouth at bedtime as needed. 90 tablet 1   No current facility-administered medications on file prior to visit.    BP 120/80 mmHg  Pulse 88  Temp(Src) 98.6 F (37 C) (Oral)  Resp 20  Ht 5' 5.75" (1.67 m)  Wt 172 lb (78.019 kg)  BMI 27.97 kg/m2  SpO2 98%      Review of Systems  Constitutional: Negative.   HENT: Negative for congestion, dental problem, hearing loss, rhinorrhea, sinus pressure, sore throat and tinnitus.   Eyes: Negative for pain, discharge and visual disturbance.  Respiratory: Negative for cough and shortness of breath.   Cardiovascular: Negative for chest pain, palpitations and leg swelling.  Gastrointestinal:  Positive for nausea. Negative for vomiting, abdominal pain, diarrhea, constipation, blood in stool and abdominal distention.  Genitourinary: Negative for dysuria, urgency, frequency, hematuria, flank pain, vaginal bleeding, vaginal discharge, difficulty urinating, vaginal pain and pelvic pain.  Musculoskeletal: Positive for myalgias and back pain. Negative for joint swelling, arthralgias and gait problem.  Skin: Negative for rash.  Neurological: Positive for headaches. Negative for dizziness, syncope, speech difficulty, weakness and numbness.  Hematological: Negative for adenopathy.  Psychiatric/Behavioral: Negative for behavioral problems, dysphoric mood and agitation. The patient is not nervous/anxious.        Objective:   Physical Exam  Constitutional: She is oriented to person, place, and time. She appears well-developed and well-nourished.  HENT:  Head: Normocephalic.  Right Ear: External ear normal.  Left Ear: External ear normal.  Mouth/Throat: Oropharynx is clear and moist.  Eyes: Conjunctivae and EOM are normal. Pupils are equal, round, and reactive to light.  Neck: Normal range of motion. Neck supple. No thyromegaly present.  Cardiovascular: Normal rate, regular rhythm, normal heart sounds and intact distal pulses.   Pulmonary/Chest: Effort normal and breath sounds normal.  Abdominal: Soft. Bowel sounds are normal. She exhibits no mass. There is no tenderness.  Musculoskeletal: Normal range of motion.  Lymphadenopathy:    She has no cervical adenopathy.  Neurological: She is alert and oriented to person, place, and time.  Skin: Skin is warm and dry. No rash noted.  Psychiatric: She has a normal mood and affect. Her behavior is normal.          Assessment & Plan:   Hypertension, well-controlled Dyslipidemia.  Continue statin therapy Fibromyalgia.  No change in therapy  CPX 6 months

## 2014-06-06 ENCOUNTER — Telehealth: Payer: Self-pay | Admitting: Internal Medicine

## 2014-06-06 MED ORDER — HYDROCODONE-ACETAMINOPHEN 5-325 MG PO TABS: ORAL_TABLET | ORAL | Status: DC

## 2014-06-06 NOTE — Telephone Encounter (Signed)
Pt notified Rx ready for pickup. Rx printed and signed.  

## 2014-06-06 NOTE — Telephone Encounter (Signed)
Pt requesting refill of HYDROcodone-acetaminophen (NORCO/VICODIN) 5-325 MG per tablet 

## 2014-06-21 ENCOUNTER — Other Ambulatory Visit: Payer: Self-pay | Admitting: Internal Medicine

## 2014-07-11 ENCOUNTER — Telehealth: Payer: Self-pay | Admitting: Internal Medicine

## 2014-07-11 MED ORDER — HYDROCODONE-ACETAMINOPHEN 5-325 MG PO TABS: ORAL_TABLET | ORAL | Status: DC

## 2014-07-11 NOTE — Telephone Encounter (Signed)
Pt notified Rx ready for pickup. Rx printed and signed.  

## 2014-07-11 NOTE — Telephone Encounter (Signed)
Pt needs new rx hydrocodone °

## 2014-07-28 ENCOUNTER — Other Ambulatory Visit: Payer: Self-pay | Admitting: Internal Medicine

## 2014-07-28 NOTE — Telephone Encounter (Signed)
Rx request for clonazepam and zolpidem.  Pls advise.

## 2014-08-02 ENCOUNTER — Other Ambulatory Visit: Payer: Self-pay | Admitting: *Deleted

## 2014-08-02 MED ORDER — ZOLPIDEM TARTRATE 10 MG PO TABS: 10.0000 mg | ORAL_TABLET | Freq: Every evening | ORAL | Status: DC | PRN

## 2014-08-02 MED ORDER — CLONAZEPAM 0.5 MG PO TABS: 0.5000 mg | ORAL_TABLET | Freq: Every evening | ORAL | Status: DC | PRN

## 2014-08-02 NOTE — Telephone Encounter (Signed)
ok 

## 2014-08-12 ENCOUNTER — Telehealth: Payer: Self-pay | Admitting: Internal Medicine

## 2014-08-12 MED ORDER — HYDROCODONE-ACETAMINOPHEN 5-325 MG PO TABS: ORAL_TABLET | ORAL | Status: DC

## 2014-08-12 NOTE — Telephone Encounter (Signed)
Patient is requesting re-fill on HYDROcodone-acetaminophen (NORCO/VICODIN) 5-325 MG per tablet. °

## 2014-08-12 NOTE — Telephone Encounter (Signed)
Pt notified Rx ready for pickup. Rx printed and signed.  

## 2014-09-12 ENCOUNTER — Telehealth: Payer: Self-pay | Admitting: Internal Medicine

## 2014-09-12 MED ORDER — HYDROCODONE-ACETAMINOPHEN 5-325 MG PO TABS: ORAL_TABLET | ORAL | Status: DC

## 2014-09-12 NOTE — Telephone Encounter (Signed)
Pt needs new rx hydrocodone °

## 2014-09-12 NOTE — Telephone Encounter (Signed)
Pt notified Rx ready for pickup. Rx printed and signed.  

## 2014-10-10 ENCOUNTER — Telehealth: Payer: Self-pay | Admitting: Internal Medicine

## 2014-10-10 MED ORDER — HYDROCODONE-ACETAMINOPHEN 5-325 MG PO TABS: ORAL_TABLET | ORAL | Status: DC

## 2014-10-10 NOTE — Telephone Encounter (Signed)
Pt notified Rx ready for pickup. Rx printed and signed.  

## 2014-10-10 NOTE — Telephone Encounter (Signed)
Pt request refill of the following: HYDROcodone-acetaminophen (NORCO/VICODIN) 5-325 MG per tablet ° ° °Phamacy: °

## 2014-10-31 ENCOUNTER — Other Ambulatory Visit: Payer: Self-pay | Admitting: Internal Medicine

## 2014-11-03 ENCOUNTER — Telehealth: Payer: Self-pay | Admitting: Internal Medicine

## 2014-11-03 NOTE — Telephone Encounter (Signed)
Patient is requesting re-fill on HYDROcodone-acetaminophen (NORCO/VICODIN) 5-325 MG per tablet.. She said Monday will be ok.

## 2014-11-07 MED ORDER — HYDROCODONE-ACETAMINOPHEN 5-325 MG PO TABS: ORAL_TABLET | ORAL | Status: DC

## 2014-11-07 NOTE — Telephone Encounter (Signed)
Pt notified Rx ready for pickup. Rx printed and signed.  

## 2014-11-15 ENCOUNTER — Other Ambulatory Visit (INDEPENDENT_AMBULATORY_CARE_PROVIDER_SITE_OTHER): Payer: 59

## 2014-11-15 DIAGNOSIS — Z0000 Encounter for general adult medical examination without abnormal findings: Secondary | ICD-10-CM | POA: Diagnosis not present

## 2014-11-15 LAB — POCT URINALYSIS DIPSTICK
Bilirubin, UA: NEGATIVE
GLUCOSE UA: NEGATIVE
Ketones, UA: NEGATIVE
LEUKOCYTES UA: NEGATIVE
NITRITE UA: NEGATIVE
Protein, UA: NEGATIVE
Spec Grav, UA: 1.015
UROBILINOGEN UA: 0.2
pH, UA: 7.5

## 2014-11-15 LAB — CBC WITH DIFFERENTIAL/PLATELET
Basophils Absolute: 0 10*3/uL (ref 0.0–0.1)
Basophils Relative: 0.7 % (ref 0.0–3.0)
EOS PCT: 4.2 % (ref 0.0–5.0)
Eosinophils Absolute: 0.2 10*3/uL (ref 0.0–0.7)
HEMATOCRIT: 40 % (ref 36.0–46.0)
Hemoglobin: 13.6 g/dL (ref 12.0–15.0)
LYMPHS ABS: 1.8 10*3/uL (ref 0.7–4.0)
Lymphocytes Relative: 33.3 % (ref 12.0–46.0)
MCHC: 34 g/dL (ref 30.0–36.0)
MCV: 89.5 fl (ref 78.0–100.0)
Monocytes Absolute: 0.5 10*3/uL (ref 0.1–1.0)
Monocytes Relative: 9.7 % (ref 3.0–12.0)
NEUTROS PCT: 52.1 % (ref 43.0–77.0)
Neutro Abs: 2.9 10*3/uL (ref 1.4–7.7)
Platelets: 247 10*3/uL (ref 150.0–400.0)
RBC: 4.47 Mil/uL (ref 3.87–5.11)
RDW: 13 % (ref 11.5–15.5)
WBC: 5.5 10*3/uL (ref 4.0–10.5)

## 2014-11-15 LAB — HEPATIC FUNCTION PANEL
ALT: 25 U/L (ref 0–35)
AST: 24 U/L (ref 0–37)
Albumin: 3.9 g/dL (ref 3.5–5.2)
Alkaline Phosphatase: 116 U/L (ref 39–117)
BILIRUBIN DIRECT: 0 mg/dL (ref 0.0–0.3)
BILIRUBIN TOTAL: 0.4 mg/dL (ref 0.2–1.2)
Total Protein: 7.1 g/dL (ref 6.0–8.3)

## 2014-11-15 LAB — LIPID PANEL
CHOL/HDL RATIO: 3
Cholesterol: 166 mg/dL (ref 0–200)
HDL: 48.1 mg/dL (ref >39.00)
LDL Cholesterol: 80 mg/dL (ref 0–99)
NonHDL: 117.9
TRIGLYCERIDES: 191 mg/dL — AB (ref 0.0–149.0)
VLDL: 38.2 mg/dL (ref 0.0–40.0)

## 2014-11-15 LAB — BASIC METABOLIC PANEL
BUN: 9 mg/dL (ref 6–23)
CALCIUM: 9.1 mg/dL (ref 8.4–10.5)
CO2: 26 mEq/L (ref 19–32)
CREATININE: 1.15 mg/dL (ref 0.40–1.20)
Chloride: 102 mEq/L (ref 96–112)
GFR: 51.7 mL/min — AB (ref >60.00)
GLUCOSE: 96 mg/dL (ref 70–99)
Potassium: 4.3 mEq/L (ref 3.5–5.1)
Sodium: 137 mEq/L (ref 135–145)

## 2014-11-15 LAB — TSH: TSH: 0.73 u[IU]/mL (ref 0.35–4.50)

## 2014-11-22 ENCOUNTER — Encounter: Payer: Self-pay | Admitting: Internal Medicine

## 2014-11-22 ENCOUNTER — Ambulatory Visit (INDEPENDENT_AMBULATORY_CARE_PROVIDER_SITE_OTHER): Payer: 59 | Admitting: Internal Medicine

## 2014-11-22 VITALS — BP 120/80 | HR 100 | Temp 99.6°F | Resp 20 | Ht 63.5 in | Wt 166.0 lb

## 2014-11-22 DIAGNOSIS — Z0000 Encounter for general adult medical examination without abnormal findings: Secondary | ICD-10-CM

## 2014-11-22 DIAGNOSIS — I10 Essential (primary) hypertension: Secondary | ICD-10-CM

## 2014-11-22 DIAGNOSIS — Z87442 Personal history of urinary calculi: Secondary | ICD-10-CM | POA: Diagnosis not present

## 2014-11-22 DIAGNOSIS — M797 Fibromyalgia: Secondary | ICD-10-CM

## 2014-11-22 NOTE — Progress Notes (Signed)
Pre visit review using our clinic review tool, if applicable. No additional management support is needed unless otherwise documented below in the visit note. 

## 2014-11-22 NOTE — Progress Notes (Signed)
Patient ID: [name], female   DOB: December 31, 1955, 59 y.o.   MRN: [id number]  Subjective:    Patient ID: [name], female    DOB: December 31, 1955, 59 y.o.   MRN: [id number]  HPI 59 year-old patient who is seen today for a preventive health examination.  Medical problems include fibromyalgia. She has history depression dyslipidemia and treated hypertension.   She has done well except for some URI symptoms over the past 9 days Laboratory studies reviewed and were unremarkable   Past History:  Past Medical History: Hypertension Nephrolithiasis, hx of recurrent UTI's fibromyalgia, and chronic fatigue migraine headaches menopausal syndrome Low back pain history of concussion, 1999 Raynoud's collagenous colitis Hyperlipidemia Vitamin D deficiency bladder stones bursitis in shoulder  Family History: Reviewed history from 04/27/2009 and no changes required. father died of suicide death at 6665.  History of dementia Family History of CAD Female 1st degree relative <50 Family History Hypertension: Father/ mother Family History of Stroke F 1st degree relative <60 Mother:   HTN, Osteoporosis PD age 57 History  of Inflammatory Bowel Disease: Brother with Crohn's No FH of Colon Cancer 4 brothers, one sister: h/o hepatitis C  Social History: Reviewed history from 04/27/2009 and no changes required. Married, grown children Illicit Drug Use - no Alcohol Use - no Patient gets regular exercise. walks 30 min a day 4-5 days a week  Daily Caffeine Use small cup of cofee  [employer] Patient is a former smoker. stopped 8/09 Smoking Status:  never  Colonoscopy 2009    Past Medical History  Diagnosis Date  . 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 DEPRESSIVE DISORDER 05/21/2010  . Diarrhea 11/13/2007  . HYPERLIPIDEMIA 12/13/2008  . HYPERTENSION 04/28/2007  . LOW BACK PAIN 12/02/2007  . NEPHROLITHIASIS, HX OF 04/28/2007  . Renal colic 02/12/2008  . UTI'S, RECURRENT 04/28/2007  . Fibromyalgia   .  Migraine   . Bursitis of shoulder   . Bladder stones   . Raynaud disease     History   Social History  . Marital Status: Married    Spouse Name: N/A  . Number of Children: N/A  . Years of Education: N/A   Occupational History  . Not on file.   Social History Main Topics  . Smoking status: Former Smoker    Quit date: 07/09/2007  . Smokeless tobacco: Never Used  . Alcohol Use: No  . Drug Use: No  . Sexual Activity: Not on file   Other Topics Concern  . Not on file   Social History Narrative    Past Surgical History  Procedure Laterality Date  . Abdominal hysterectomy  1990    fiboids, prolapse    Family History  Problem Relation Age of Onset  . Hyperlipidemia Mother   . Hypertension Mother     Allergies  Allergen Reactions  . Codeine Sulfate     REACTION: unspecified  . Diphenhydramine Hcl     REACTION: side effect of being irritable  . Moxifloxacin     REACTION: unspecified  . Penicillins     REACTION: unspecified  . Sulfonamide Derivatives   . Tramadol Hcl     REACTION: unspecified    Current Outpatient Prescriptions on File Prior to Visit  Medication Sig Dispense Refill  . aspirin 81 MG tablet Take 81 mg by mouth daily.      Marland Kitchen. atorvastatin (LIPITOR) 40 MG tablet Take 1 tablet by mouth  daily 90 tablet 1  . Cholecalciferol (VITAMIN D) 1000 UNITS capsule Take  1,000 Units by mouth daily.      . clonazePAM (KLONOPIN) 0.5 MG tablet Take 1 tablet (0.5 mg total) by mouth at bedtime as needed for anxiety. 180 tablet 1  . HYDROcodone-acetaminophen (NORCO/VICODIN) 5-325 MG per tablet TAKE ONE TABLET BY MOUTH EVERY 6 HOURS AS NEEDED FOR PAIN 60 tablet 0  . loperamide (IMODIUM) 2 MG capsule Take 1 capsule (2 mg total) by mouth 4 (four) times daily as needed. 30 capsule 6  . potassium chloride SA (K-DUR,KLOR-CON) 20 MEQ tablet Take 1 tablet by mouth two  times daily 180 tablet 1  . promethazine (PHENERGAN) 25 MG tablet Take 1 tablet by mouth  every 6 hours as  needed for nausea 360 tablet 1  . venlafaxine (EFFEXOR) 37.5 MG tablet Take 1 tablet by mouth  twice a day 180 tablet 1  . verapamil (CALAN-SR) 240 MG CR tablet Take 1 tablet by mouth at  bedtime 90 tablet 1  . zolpidem (AMBIEN) 10 MG tablet Take 1 tablet (10 mg total) by mouth at bedtime as needed. 90 tablet 1   No current facility-administered medications on file prior to visit.    BP 120/80 mmHg  Pulse 100  Temp(Src) 99.6 F (37.6 C) (Oral)  Resp 20  Ht 5' 3.5" (1.613 m)  Wt 166 lb (75.297 kg)  BMI 28.94 kg/m2  SpO2 95%      Review of Systems  Constitutional: Negative.   HENT: Negative for congestion, dental problem, hearing loss, rhinorrhea, sinus pressure, sore throat and tinnitus.   Eyes: Negative for pain, discharge and visual disturbance.  Respiratory: Negative for cough and shortness of breath.   Cardiovascular: Negative for chest pain, palpitations and leg swelling.  Gastrointestinal: Negative for nausea, vomiting, abdominal pain, diarrhea, constipation, blood in stool and abdominal distention.  Genitourinary: Negative for dysuria, urgency, frequency, hematuria, flank pain, vaginal bleeding, vaginal discharge, difficulty urinating, vaginal pain and pelvic pain.  Musculoskeletal: Positive for myalgias. Negative for joint swelling, arthralgias and gait problem.  Skin: Negative for rash.  Neurological: Negative for dizziness, syncope, speech difficulty, weakness, numbness and headaches.  Hematological: Negative for adenopathy.  Psychiatric/Behavioral: Negative for behavioral problems, dysphoric mood and agitation. The patient is not nervous/anxious.        Objective:   Physical Exam  Constitutional: She is oriented to person, place, and time. She appears well-developed and well-nourished.  HENT:  Head: Normocephalic and atraumatic.  Right Ear: External ear normal.  Left Ear: External ear normal.  Mouth/Throat: Oropharynx is clear and moist.  Eyes: Conjunctivae and  EOM are normal.  Neck: Normal range of motion. Neck supple. No JVD present. No thyromegaly present.  Cardiovascular: Normal rate, regular rhythm and normal heart sounds.   No murmur heard. Diminished right dorsalis pedis pulse   Pulmonary/Chest: Effort normal and breath sounds normal. She has no wheezes. She has no rales.  Abdominal: Soft. Bowel sounds are normal. She exhibits no distension and no mass. There is no tenderness. There is no rebound and no guarding.  Musculoskeletal: Normal range of motion. She exhibits no edema or tenderness.  Neurological: She is alert and oriented to person, place, and time. She has normal reflexes. No cranial nerve deficit. She exhibits normal muscle tone. Coordination normal.  Reflexes brisk and equal  Skin: Skin is warm and dry. Rash noted.  Psychiatric: She has a normal mood and affect. Her behavior is normal.          Assessment & Plan:    Preventive health examination  Hypertension well controlled Dyslipidemia.  Low back pain, stable Fibromyalgia History of nephrolithiasis, stable  Recheck 6 months

## 2014-11-22 NOTE — Patient Instructions (Addendum)
Acute bronchitis symptoms  are generally not helped by antibiotics.  Take over-the-counter expectorants and cough medications such as  Mucinex DM.  Call if there is no improvement in 5 to 7 days or if  you develop worsening cough, fever, or new symptoms, such as shortness of breath or chest pain.  Limit your sodium (Salt) intake  Please check your blood pressure on a regular basis.  If it is consistently greater than 150/90, please make an office appointment.  Health Maintenance Adopting a healthy lifestyle and getting preventive care can go a long way to promote health and wellness. Talk with your health care provider about what schedule of regular examinations is right for you. This is a good chance for you to check in with your provider about disease prevention and staying healthy. In between checkups, there are plenty of things you can do on your own. Experts have done a lot of research about which lifestyle changes and preventive measures are most likely to keep you healthy. Ask your health care provider for more information. WEIGHT AND DIET  Eat a healthy diet  Be sure to include plenty of vegetables, fruits, low-fat dairy products, and lean protein.  Do not eat a lot of foods high in solid fats, added sugars, or salt.  Get regular exercise. This is one of the most important things you can do for your health.  Most adults should exercise for at least 150 minutes each week. The exercise should increase your heart rate and make you sweat (moderate-intensity exercise).  Most adults should also do strengthening exercises at least twice a week. This is in addition to the moderate-intensity exercise.  Maintain a healthy weight  Body mass index (BMI) is a measurement that can be used to identify possible weight problems. It estimates body fat based on height and weight. Your health care provider can help determine your BMI and help you achieve or maintain a healthy weight.  For females 35  years of age and older:   A BMI below 18.5 is considered underweight.  A BMI of 18.5 to 24.9 is normal.  A BMI of 25 to 29.9 is considered overweight.  A BMI of 30 and above is considered obese.  Watch levels of cholesterol and blood lipids  You should start having your blood tested for lipids and cholesterol at 57 years of age, then have this test every 5 years.  You may need to have your cholesterol levels checked more often if:  Your lipid or cholesterol levels are high.  You are older than 57 years of age.  You are at high risk for heart disease.  CANCER SCREENING   Lung Cancer  Lung cancer screening is recommended for adults 33-2 years old who are at high risk for lung cancer because of a history of smoking.  A yearly low-dose CT scan of the lungs is recommended for people who:  Currently smoke.  Have quit within the past 15 years.  Have at least a 30-pack-year history of smoking. A pack year is smoking an average of one pack of cigarettes a day for 1 year.  Yearly screening should continue until it has been 15 years since you quit.  Yearly screening should stop if you develop a health problem that would prevent you from having lung cancer treatment.  Breast Cancer  Practice breast self-awareness. This means understanding how your breasts normally appear and feel.  It also means doing regular breast self-exams. Let your health care provider know  about any changes, no matter how small.  If you are in your 20s or 30s, you should have a clinical breast exam (CBE) by a health care provider every 1-3 years as part of a regular health exam.  If you are 103 or older, have a CBE every year. Also consider having a breast X-ray (mammogram) every year.  If you have a family history of breast cancer, talk to your health care provider about genetic screening.  If you are at high risk for breast cancer, talk to your health care provider about having an MRI and a mammogram  every year.  Breast cancer gene (BRCA) assessment is recommended for women who have family members with BRCA-related cancers. BRCA-related cancers include:  Breast.  Ovarian.  Tubal.  Peritoneal cancers.  Results of the assessment will determine the need for genetic counseling and BRCA1 and BRCA2 testing. Cervical Cancer Routine pelvic examinations to screen for cervical cancer are no longer recommended for nonpregnant women who are considered low risk for cancer of the pelvic organs (ovaries, uterus, and vagina) and who do not have symptoms. A pelvic examination may be necessary if you have symptoms including those associated with pelvic infections. Ask your health care provider if a screening pelvic exam is right for you.   The Pap test is the screening test for cervical cancer for women who are considered at risk.  If you had a hysterectomy for a problem that was not cancer or a condition that could lead to cancer, then you no longer need Pap tests.  If you are older than 65 years, and you have had normal Pap tests for the past 10 years, you no longer need to have Pap tests.  If you have had past treatment for cervical cancer or a condition that could lead to cancer, you need Pap tests and screening for cancer for at least 20 years after your treatment.  If you no longer get a Pap test, assess your risk factors if they change (such as having a new sexual partner). This can affect whether you should start being screened again.  Some women have medical problems that increase their chance of getting cervical cancer. If this is the case for you, your health care provider may recommend more frequent screening and Pap tests.  The human papillomavirus (HPV) test is another test that may be used for cervical cancer screening. The HPV test looks for the virus that can cause cell changes in the cervix. The cells collected during the Pap test can be tested for HPV.  The HPV test can be used to  screen women 10 years of age and older. Getting tested for HPV can extend the interval between normal Pap tests from three to five years.  An HPV test also should be used to screen women of any age who have unclear Pap test results.  After 57 years of age, women should have HPV testing as often as Pap tests.  Colorectal Cancer  This type of cancer can be detected and often prevented.  Routine colorectal cancer screening usually begins at 57 years of age and continues through 57 years of age.  Your health care provider may recommend screening at an earlier age if you have risk factors for colon cancer.  Your health care provider may also recommend using home test kits to check for hidden blood in the stool.  A small camera at the end of a tube can be used to examine your colon directly (sigmoidoscopy  or colonoscopy). This is done to check for the earliest forms of colorectal cancer.  Routine screening usually begins at age 68.  Direct examination of the colon should be repeated every 5-10 years through 57 years of age. However, you may need to be screened more often if early forms of precancerous polyps or small growths are found. Skin Cancer  Check your skin from head to toe regularly.  Tell your health care provider about any new moles or changes in moles, especially if there is a change in a mole's shape or color.  Also tell your health care provider if you have a mole that is larger than the size of a pencil eraser.  Always use sunscreen. Apply sunscreen liberally and repeatedly throughout the day.  Protect yourself by wearing long sleeves, pants, a wide-brimmed hat, and sunglasses whenever you are outside. HEART DISEASE, DIABETES, AND HIGH BLOOD PRESSURE   Have your blood pressure checked at least every 1-2 years. High blood pressure causes heart disease and increases the risk of stroke.  If you are between 56 years and 55 years old, ask your health care provider if you should  take aspirin to prevent strokes.  Have regular diabetes screenings. This involves taking a blood sample to check your fasting blood sugar level.  If you are at a normal weight and have a low risk for diabetes, have this test once every three years after 57 years of age.  If you are overweight and have a high risk for diabetes, consider being tested at a younger age or more often. PREVENTING INFECTION  Hepatitis B  If you have a higher risk for hepatitis B, you should be screened for this virus. You are considered at high risk for hepatitis B if:  You were born in a country where hepatitis B is common. Ask your health care provider which countries are considered high risk.  Your parents were born in a high-risk country, and you have not been immunized against hepatitis B (hepatitis B vaccine).  You have HIV or AIDS.  You use needles to inject street drugs.  You live with someone who has hepatitis B.  You have had sex with someone who has hepatitis B.  You get hemodialysis treatment.  You take certain medicines for conditions, including cancer, organ transplantation, and autoimmune conditions. Hepatitis C  Blood testing is recommended for:  Everyone born from [birth year] through 1963.  Anyone with known risk factors for hepatitis C. Sexually transmitted infections (STIs)  You should be screened for sexually transmitted infections (STIs) including gonorrhea and chlamydia if:  You are sexually active and are younger than 57 years of age.  You are older than 57 years of age and your health care provider tells you that you are at risk for this type of infection.  Your sexual activity has changed since you were last screened and you are at an increased risk for chlamydia or gonorrhea. Ask your health care provider if you are at risk.  If you do not have HIV, but are at risk, it may be recommended that you take a prescription medicine daily to prevent HIV infection. This is called  pre-exposure prophylaxis (PrEP). You are considered at risk if:  You are sexually active and do not regularly use condoms or know the HIV status of your partner(s).  You take drugs by injection.  You are sexually active with a partner who has HIV. Talk with your health care provider about whether you are at high  risk of being infected with HIV. If you choose to begin PrEP, you should first be tested for HIV. You should then be tested every 3 months for as long as you are taking PrEP.  PREGNANCY   If you are premenopausal and you may become pregnant, ask your health care provider about preconception counseling.  If you may become pregnant, take 400 to 800 micrograms (mcg) of folic acid every day.  If you want to prevent pregnancy, talk to your health care provider about birth control (contraception). OSTEOPOROSIS AND MENOPAUSE   Osteoporosis is a disease in which the bones lose minerals and strength with aging. This can result in serious bone fractures. Your risk for osteoporosis can be identified using a bone density scan.  If you are 52 years of age or older, or if you are at risk for osteoporosis and fractures, ask your health care provider if you should be screened.  Ask your health care provider whether you should take a calcium or vitamin D supplement to lower your risk for osteoporosis.  Menopause may have certain physical symptoms and risks.  Hormone replacement therapy may reduce some of these symptoms and risks. Talk to your health care provider about whether hormone replacement therapy is right for you.  HOME CARE INSTRUCTIONS   Schedule regular health, dental, and eye exams.  Stay current with your immunizations.   Do not use any tobacco products including cigarettes, chewing tobacco, or electronic cigarettes.  If you are pregnant, do not drink alcohol.  If you are breastfeeding, limit how much and how often you drink alcohol.  Limit alcohol intake to no more than 1  drink per day for nonpregnant women. One drink equals 12 ounces of beer, 5 ounces of wine, or 1 ounces of hard liquor.  Do not use street drugs.  Do not share needles.  Ask your health care provider for help if you need support or information about quitting drugs.  Tell your health care provider if you often feel depressed.  Tell your health care provider if you have ever been abused or do not feel safe at home. Document Released: 01/07/2011 Document Revised: 11/08/2013 Document Reviewed: 05/26/2013 [facility] Patient Information 2015 [place], [place]. This information is not intended to replace advice given to you by your health care provider. Make sure you discuss any questions you have with your health care provider.

## 2014-12-02 ENCOUNTER — Telehealth: Payer: Self-pay | Admitting: Internal Medicine

## 2014-12-02 NOTE — Telephone Encounter (Signed)
Spoke to pt, told her I will not have Rx ready till Tuesday. Pt said that is fine. Told her okay can pick up on Tuesday. Pt verbalized understanding.

## 2014-12-02 NOTE — Telephone Encounter (Signed)
Pt request refill of the following: HYDROcodone-acetaminophen (NORCO/VICODIN) 5-325 MG per tablet ° ° °Phamacy: °

## 2014-12-06 MED ORDER — HYDROCODONE-ACETAMINOPHEN 5-325 MG PO TABS: ORAL_TABLET | ORAL | Status: DC

## 2014-12-06 NOTE — Telephone Encounter (Signed)
Pt notified Rx ready for pickup. Rx printed and signed.  

## 2014-12-07 ENCOUNTER — Telehealth: Payer: Self-pay

## 2014-12-07 DIAGNOSIS — Z1231 Encounter for screening mammogram for malignant neoplasm of breast: Secondary | ICD-10-CM

## 2014-12-07 NOTE — Telephone Encounter (Signed)
Spoke with pt, agreed for [facility] to [facility] for her mammogram.

## 2014-12-12 ENCOUNTER — Telehealth: Payer: Self-pay | Admitting: *Deleted

## 2014-12-12 NOTE — Telephone Encounter (Signed)
Patient would like a refill of zolpidem (AMBIEN) 10 MG tablet [facility]

## 2014-12-13 MED ORDER — ZOLPIDEM TARTRATE 10 MG PO TABS: 10.0000 mg | ORAL_TABLET | Freq: Every evening | ORAL | Status: DC | PRN

## 2014-12-13 NOTE — Telephone Encounter (Signed)
Rx faxed to [facility]. 

## 2014-12-23 ENCOUNTER — Ambulatory Visit: Payer: 59

## 2014-12-30 ENCOUNTER — Telehealth: Payer: Self-pay | Admitting: Internal Medicine

## 2014-12-30 MED ORDER — HYDROCODONE-ACETAMINOPHEN 5-325 MG PO TABS: ORAL_TABLET | ORAL | Status: DC

## 2014-12-30 NOTE — Telephone Encounter (Signed)
Pt needs new rx hydrocodone °

## 2014-12-30 NOTE — Telephone Encounter (Signed)
Left detailed message on personal voicemail Rx ready for pickup, will be at the front desk. Rx printed and signed.  

## 2015-01-30 ENCOUNTER — Telehealth: Payer: Self-pay | Admitting: Internal Medicine

## 2015-01-30 MED ORDER — HYDROCODONE-ACETAMINOPHEN 5-325 MG PO TABS: ORAL_TABLET | ORAL | Status: DC

## 2015-01-30 NOTE — Telephone Encounter (Signed)
Patient need re-fill on HYDROcodone-acetaminophen (NORCO/VICODIN) 5-325 MG per tablet °

## 2015-01-30 NOTE — Telephone Encounter (Signed)
Left detailed message on personal voicemail Rx ready for pickup, will be at the front desk. Rx printed and signed.  

## 2015-02-27 ENCOUNTER — Other Ambulatory Visit: Payer: Self-pay | Admitting: Internal Medicine

## 2015-02-27 NOTE — Telephone Encounter (Signed)
Patient is requesting a refill of clonazePAM (KLONOPIN) 0.5 MG tablet [facility]

## 2015-02-28 ENCOUNTER — Other Ambulatory Visit: Payer: Self-pay | Admitting: *Deleted

## 2015-02-28 MED ORDER — CLONAZEPAM 0.5 MG PO TABS: 0.5000 mg | ORAL_TABLET | Freq: Every evening | ORAL | Status: DC | PRN

## 2015-03-02 ENCOUNTER — Telehealth: Payer: Self-pay

## 2015-03-02 MED ORDER — HYDROCODONE-ACETAMINOPHEN 5-325 MG PO TABS: ORAL_TABLET | ORAL | Status: DC

## 2015-03-02 NOTE — Telephone Encounter (Signed)
The pt called and is hoping to get a refill of hydrocodone   Pt callback - [phone]

## 2015-03-02 NOTE — Telephone Encounter (Signed)
Pt notified Rx ready for pickup. Rx printed and signed.  

## 2015-04-04 ENCOUNTER — Telehealth: Payer: Self-pay | Admitting: Internal Medicine

## 2015-04-04 MED ORDER — HYDROCODONE-ACETAMINOPHEN 5-325 MG PO TABS: ORAL_TABLET | ORAL | Status: DC

## 2015-04-04 NOTE — Telephone Encounter (Signed)
Pt request refill of the following: HYDROcodone-acetaminophen (NORCO/VICODIN) 5-325 MG per tablet ° ° °Phamacy: °

## 2015-04-04 NOTE — Telephone Encounter (Signed)
Pt notified Rx ready for pickup. Rx printed and signed.  

## 2015-05-02 ENCOUNTER — Telehealth: Payer: Self-pay | Admitting: Internal Medicine

## 2015-05-02 MED ORDER — HYDROCODONE-ACETAMINOPHEN 5-325 MG PO TABS: ORAL_TABLET | ORAL | Status: DC

## 2015-05-02 NOTE — Telephone Encounter (Signed)
Pt request refill  °HYDROcodone-acetaminophen (NORCO/VICODIN) 5-325 MG tablet °

## 2015-05-02 NOTE — Telephone Encounter (Signed)
Pt notified Rx ready for pickup. Rx printed and signed.  

## 2015-05-09 ENCOUNTER — Ambulatory Visit (INDEPENDENT_AMBULATORY_CARE_PROVIDER_SITE_OTHER): Payer: 59 | Admitting: Internal Medicine

## 2015-05-09 ENCOUNTER — Encounter: Payer: Self-pay | Admitting: Internal Medicine

## 2015-05-09 VITALS — BP 140/90 | HR 84 | Temp 98.6°F | Resp 20 | Ht 63.5 in | Wt 168.0 lb

## 2015-05-09 DIAGNOSIS — E785 Hyperlipidemia, unspecified: Secondary | ICD-10-CM

## 2015-05-09 DIAGNOSIS — Z23 Encounter for immunization: Secondary | ICD-10-CM | POA: Diagnosis not present

## 2015-05-09 DIAGNOSIS — I10 Essential (primary) hypertension: Secondary | ICD-10-CM

## 2015-05-09 NOTE — Progress Notes (Signed)
Subjective:    Patient ID: [name], female    DOB: 07/11/1955, 59 y.o.   MRN: [id number]  HPI  59 year old patient who is seen today for her six-month follow-up.  She has a history of dyslipidemia, controlled with statin therapy.  She has essential hypertension.  She has low back pain and history of IBS and fibromyalgia.  In general doing quite well.  No new concerns or complaints.  Past Medical History  Diagnosis Date  . 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 DEPRESSIVE DISORDER 05/21/2010  . Diarrhea 11/13/2007  . HYPERLIPIDEMIA 12/13/2008  . HYPERTENSION 04/28/2007  . LOW BACK PAIN 12/02/2007  . NEPHROLITHIASIS, HX OF 04/28/2007  . Renal colic 02/12/2008  . UTI'S, RECURRENT 04/28/2007  . Fibromyalgia   . Migraine   . Bursitis of shoulder   . Bladder stones   . Raynaud disease     Social History   Social History  . Marital Status: Married    Spouse Name: N/A  . Number of Children: N/A  . Years of Education: N/A   Occupational History  . Not on file.   Social History Main Topics  . Smoking status: Former Smoker    Quit date: 07/09/2007  . Smokeless tobacco: Never Used  . Alcohol Use: No  . Drug Use: No  . Sexual Activity: Not on file   Other Topics Concern  . Not on file   Social History Narrative    Past Surgical History  Procedure Laterality Date  . Abdominal hysterectomy  1990    fiboids, prolapse    Family History  Problem Relation Age of Onset  . Hyperlipidemia Mother   . Hypertension Mother     Allergies  Allergen Reactions  . Codeine Sulfate     REACTION: unspecified  . Diphenhydramine Hcl     REACTION: side effect of being irritable  . Moxifloxacin     REACTION: unspecified  . Penicillins     REACTION: unspecified  . Sulfonamide Derivatives   . Tramadol Hcl     REACTION: unspecified    Current Outpatient Prescriptions on File Prior to Visit  Medication Sig Dispense Refill  . aspirin 81 MG tablet Take 81 mg by mouth daily.      Marland Kitchen.  atorvastatin (LIPITOR) 40 MG tablet Take 1 tablet by mouth  daily 90 tablet 1  . Cholecalciferol (VITAMIN D) 1000 UNITS capsule Take 1,000 Units by mouth daily.      . clonazePAM (KLONOPIN) 0.5 MG tablet Take 1 tablet (0.5 mg total) by mouth at bedtime as needed for anxiety. 180 tablet 1  . HYDROcodone-acetaminophen (NORCO/VICODIN) 5-325 MG tablet TAKE ONE TABLET BY MOUTH EVERY 6 HOURS AS NEEDED FOR PAIN 60 tablet 0  . loperamide (IMODIUM) 2 MG capsule Take 1 capsule (2 mg total) by mouth 4 (four) times daily as needed. 30 capsule 6  . potassium chloride SA (K-DUR,KLOR-CON) 20 MEQ tablet Take 1 tablet by mouth two  times daily 180 tablet 1  . promethazine (PHENERGAN) 25 MG tablet Take 1 tablet by mouth  every 6 hours as needed for nausea 360 tablet 0  . venlafaxine (EFFEXOR) 37.5 MG tablet Take 1 tablet by mouth  twice a day 180 tablet 1  . verapamil (CALAN-SR) 240 MG CR tablet Take 1 tablet by mouth at  bedtime 90 tablet 1  . zolpidem (AMBIEN) 10 MG tablet Take 1 tablet (10 mg total) by mouth at bedtime as needed. 90 tablet 1   No current facility-administered  medications on file prior to visit.    BP 140/90 mmHg  Pulse 84  Temp(Src) 98.6 F (37 C) (Oral)  Resp 20  Ht 5' 3.5" (1.613 m)  Wt 168 lb (76.204 kg)  BMI 29.29 kg/m2  SpO2 98%     Review of Systems  Constitutional: Negative.   HENT: Negative for congestion, dental problem, hearing loss, rhinorrhea, sinus pressure, sore throat and tinnitus.   Eyes: Negative for pain, discharge and visual disturbance.  Respiratory: Negative for cough and shortness of breath.   Cardiovascular: Negative for chest pain, palpitations and leg swelling.  Gastrointestinal: Positive for diarrhea. Negative for nausea, vomiting, abdominal pain, constipation, blood in stool and abdominal distention.  Genitourinary: Negative for dysuria, urgency, frequency, hematuria, flank pain, vaginal bleeding, vaginal discharge, difficulty urinating, vaginal pain and  pelvic pain.  Musculoskeletal: Positive for back pain and arthralgias. Negative for joint swelling and gait problem.  Skin: Negative for rash.  Neurological: Negative for dizziness, syncope, speech difficulty, weakness, numbness and headaches.  Hematological: Negative for adenopathy.  Psychiatric/Behavioral: Negative for behavioral problems, dysphoric mood and agitation. The patient is not nervous/anxious.        Objective:   Physical Exam  Constitutional: She is oriented to person, place, and time. She appears well-developed and well-nourished.  Repeat blood pressure 130/84  HENT:  Head: Normocephalic.  Right Ear: External ear normal.  Left Ear: External ear normal.  Mouth/Throat: Oropharynx is clear and moist.  Eyes: Conjunctivae and EOM are normal. Pupils are equal, round, and reactive to light.  Neck: Normal range of motion. Neck supple. No thyromegaly present.  Cardiovascular: Normal rate, regular rhythm, normal heart sounds and intact distal pulses.   Pulmonary/Chest: Effort normal and breath sounds normal.  Abdominal: Soft. Bowel sounds are normal. She exhibits no mass. There is no tenderness.  Musculoskeletal: Normal range of motion.  Lymphadenopathy:    She has no cervical adenopathy.  Neurological: She is alert and oriented to person, place, and time.  Skin: Skin is warm and dry. No rash noted.  Psychiatric: She has a normal mood and affect. Her behavior is normal.          Assessment & Plan:   Hypertension.  No change in therapy exercise modest weight loss encouraged low-salt diet recommended Dyslipidemia stable  CPX 6 months No change in therapy

## 2015-05-09 NOTE — Progress Notes (Signed)
Pre visit review using our clinic review tool, if applicable. No additional management support is needed unless otherwise documented below in the visit note. 

## 2015-05-09 NOTE — Patient Instructions (Signed)
Limit your sodium (Salt) intake  Please check your blood pressure on a regular basis.  If it is consistently greater than 150/90, please make an office appointment.  You need to lose weight.  Consider a lower calorie diet and regular exercise.  Return in 6 months for follow-up  

## 2015-05-17 ENCOUNTER — Other Ambulatory Visit: Payer: Self-pay | Admitting: Internal Medicine

## 2015-05-18 ENCOUNTER — Telehealth: Payer: Self-pay

## 2015-05-18 NOTE — Telephone Encounter (Signed)
Refill request for zolpidem 10 mg tablets. Rx sent on 6.7.2016 for a 6 month supply.   Pharm:  [facility]  Pls advise.

## 2015-05-18 NOTE — Telephone Encounter (Signed)
Sent fax back to [facility] with this noted.

## 2015-05-18 NOTE — Telephone Encounter (Signed)
There should be one month left on Rx. Do not fill. Thanks

## 2015-05-22 ENCOUNTER — Telehealth: Payer: Self-pay | Admitting: Internal Medicine

## 2015-05-22 MED ORDER — ZOLPIDEM TARTRATE 10 MG PO TABS: 10.0000 mg | ORAL_TABLET | Freq: Every evening | ORAL | Status: DC | PRN

## 2015-05-22 NOTE — Telephone Encounter (Signed)
Pt said her generic  Remus Lofflerambien was denied and to call the md office. [facility]

## 2015-05-22 NOTE — Telephone Encounter (Signed)
Spoke to pt, told her I thought she had a refill left? Pt said according to her bottle there are no refills. Told pt okay will send new Rx to [facility]. Pt verbalized understanding. Rx faxed to [facility].

## 2015-05-25 ENCOUNTER — Ambulatory Visit: Payer: 59 | Admitting: Internal Medicine

## 2015-05-29 ENCOUNTER — Telehealth: Payer: Self-pay | Admitting: Internal Medicine

## 2015-05-29 NOTE — Telephone Encounter (Signed)
° ° ° ° ° °  Pt request refill of the following: ° °HYDROcodone-acetaminophen (NORCO/VICODIN) 5-325 MG tablet ° ° °Phamacy: °

## 2015-05-30 MED ORDER — HYDROCODONE-ACETAMINOPHEN 5-325 MG PO TABS: ORAL_TABLET | ORAL | Status: DC

## 2015-05-30 NOTE — Telephone Encounter (Signed)
Pt notified Rx ready for pickup. Rx printed and signed by Dr. Todd.  

## 2015-06-21 ENCOUNTER — Other Ambulatory Visit: Payer: Self-pay

## 2015-07-04 ENCOUNTER — Telehealth: Payer: Self-pay | Admitting: Internal Medicine

## 2015-07-04 MED ORDER — HYDROCODONE-ACETAMINOPHEN 5-325 MG PO TABS: ORAL_TABLET | ORAL | Status: DC

## 2015-07-04 NOTE — Telephone Encounter (Signed)
Pt needs new rx hydrocodone °

## 2015-07-04 NOTE — Telephone Encounter (Signed)
Pt notified Rx ready for pickup. Rx printed and signed by Dr. Burchette.  

## 2015-07-19 ENCOUNTER — Other Ambulatory Visit: Payer: Self-pay | Admitting: *Deleted

## 2015-07-20 MED ORDER — CLONAZEPAM 0.5 MG PO TABS: 0.5000 mg | ORAL_TABLET | Freq: Every evening | ORAL | Status: DC | PRN

## 2015-08-03 ENCOUNTER — Telehealth: Payer: Self-pay | Admitting: Internal Medicine

## 2015-08-03 MED ORDER — HYDROCODONE-ACETAMINOPHEN 5-325 MG PO TABS: ORAL_TABLET | ORAL | Status: DC

## 2015-08-03 NOTE — Telephone Encounter (Signed)
Pt request refill  °HYDROcodone-acetaminophen (NORCO/VICODIN) 5-325 MG tablet °

## 2015-08-03 NOTE — Telephone Encounter (Signed)
Pt notified Rx ready for pickup. Rx printed and signed.  

## 2015-09-07 ENCOUNTER — Telehealth: Payer: Self-pay | Admitting: Internal Medicine

## 2015-09-07 MED ORDER — HYDROCODONE-ACETAMINOPHEN 5-325 MG PO TABS: ORAL_TABLET | ORAL | Status: DC

## 2015-09-07 NOTE — Telephone Encounter (Signed)
Patient need refill of medication Hydrocodone

## 2015-09-08 NOTE — Telephone Encounter (Signed)
Rx is ready for pick up.

## 2015-10-03 ENCOUNTER — Telehealth: Payer: Self-pay | Admitting: Internal Medicine

## 2015-10-03 MED ORDER — HYDROCODONE-ACETAMINOPHEN 5-325 MG PO TABS: ORAL_TABLET | ORAL | Status: DC

## 2015-10-03 NOTE — Telephone Encounter (Signed)
Pt notified Rx ready for pickup. Rx printed and signed.  

## 2015-10-03 NOTE — Telephone Encounter (Signed)
° ° ° ° ° °  Pt request refill of the following: ° °HYDROcodone-acetaminophen (NORCO/VICODIN) 5-325 MG tablet ° ° °Phamacy: °

## 2015-10-13 ENCOUNTER — Other Ambulatory Visit: Payer: Self-pay | Admitting: Internal Medicine

## 2015-10-24 ENCOUNTER — Other Ambulatory Visit: Payer: Self-pay | Admitting: *Deleted

## 2015-10-24 MED ORDER — ZOLPIDEM TARTRATE 10 MG PO TABS: 10.0000 mg | ORAL_TABLET | Freq: Every evening | ORAL | Status: DC | PRN

## 2015-11-03 ENCOUNTER — Telehealth: Payer: Self-pay | Admitting: Internal Medicine

## 2015-11-03 MED ORDER — HYDROCODONE-ACETAMINOPHEN 5-325 MG PO TABS: ORAL_TABLET | ORAL | Status: DC

## 2015-11-03 NOTE — Telephone Encounter (Signed)
done

## 2015-11-03 NOTE — Telephone Encounter (Signed)
Pt is aware that Rx is ready for pickup  

## 2015-11-03 NOTE — Telephone Encounter (Signed)
Pt request refill  °HYDROcodone-acetaminophen (NORCO/VICODIN) 5-325 MG tablet °

## 2015-11-03 NOTE — Telephone Encounter (Signed)
Pt last visit 05/09/15 Pt last Rx 10/03/14 #[id number]

## 2015-11-21 ENCOUNTER — Other Ambulatory Visit (INDEPENDENT_AMBULATORY_CARE_PROVIDER_SITE_OTHER): Payer: 59

## 2015-11-21 DIAGNOSIS — Z0000 Encounter for general adult medical examination without abnormal findings: Secondary | ICD-10-CM | POA: Diagnosis not present

## 2015-11-21 DIAGNOSIS — R7989 Other specified abnormal findings of blood chemistry: Secondary | ICD-10-CM | POA: Diagnosis not present

## 2015-11-21 LAB — CBC WITH DIFFERENTIAL/PLATELET
BASOS ABS: 0.1 10*3/uL (ref 0.0–0.1)
Basophils Relative: 0.7 % (ref 0.0–3.0)
Eosinophils Absolute: 0.3 10*3/uL (ref 0.0–0.7)
Eosinophils Relative: 3.4 % (ref 0.0–5.0)
HCT: 41.3 % (ref 36.0–46.0)
Hemoglobin: 13.9 g/dL (ref 12.0–15.0)
LYMPHS ABS: 3 10*3/uL (ref 0.7–4.0)
Lymphocytes Relative: 37 % (ref 12.0–46.0)
MCHC: 33.6 g/dL (ref 30.0–36.0)
MCV: 90.8 fl (ref 78.0–100.0)
MONO ABS: 0.5 10*3/uL (ref 0.1–1.0)
MONOS PCT: 6.3 % (ref 3.0–12.0)
NEUTROS ABS: 4.3 10*3/uL (ref 1.4–7.7)
NEUTROS PCT: 52.6 % (ref 43.0–77.0)
PLATELETS: 294 10*3/uL (ref 150.0–400.0)
RBC: 4.55 Mil/uL (ref 3.87–5.11)
RDW: 13.7 % (ref 11.5–15.5)
WBC: 8.1 10*3/uL (ref 4.0–10.5)

## 2015-11-21 LAB — POC URINALSYSI DIPSTICK (AUTOMATED)
Bilirubin, UA: NEGATIVE
Glucose, UA: NEGATIVE
Ketones, UA: NEGATIVE
Leukocytes, UA: NEGATIVE
NITRITE UA: NEGATIVE
PH UA: 6.5
Protein, UA: NEGATIVE
Spec Grav, UA: 1.02
UROBILINOGEN UA: 0.2

## 2015-11-21 LAB — HEPATIC FUNCTION PANEL
ALBUMIN: 4.2 g/dL (ref 3.5–5.2)
ALK PHOS: 73 U/L (ref 39–117)
ALT: 21 U/L (ref 0–35)
AST: 19 U/L (ref 0–37)
BILIRUBIN TOTAL: 0.4 mg/dL (ref 0.2–1.2)
Bilirubin, Direct: 0.1 mg/dL (ref 0.0–0.3)
Total Protein: 7.1 g/dL (ref 6.0–8.3)

## 2015-11-21 LAB — BASIC METABOLIC PANEL
BUN: 13 mg/dL (ref 6–23)
CALCIUM: 9.1 mg/dL (ref 8.4–10.5)
CO2: 25 meq/L (ref 19–32)
CREATININE: 1 mg/dL (ref 0.40–1.20)
Chloride: 104 mEq/L (ref 96–112)
GFR: 60.53 mL/min (ref >60.00)
GLUCOSE: 96 mg/dL (ref 70–99)
Potassium: 3.6 mEq/L (ref 3.5–5.1)
SODIUM: 139 meq/L (ref 135–145)

## 2015-11-21 LAB — LIPID PANEL
CHOLESTEROL: 185 mg/dL (ref 0–200)
HDL: 48.7 mg/dL (ref >39.00)
NonHDL: 136.16
TRIGLYCERIDES: 235 mg/dL — AB (ref 0.0–149.0)
Total CHOL/HDL Ratio: 4
VLDL: 47 mg/dL — ABNORMAL HIGH (ref 0.0–40.0)

## 2015-11-21 LAB — TSH: TSH: 1.86 u[IU]/mL (ref 0.35–4.50)

## 2015-11-21 LAB — LDL CHOLESTEROL, DIRECT: Direct LDL: 98 mg/dL

## 2015-11-28 ENCOUNTER — Ambulatory Visit (INDEPENDENT_AMBULATORY_CARE_PROVIDER_SITE_OTHER): Payer: 59 | Admitting: Internal Medicine

## 2015-11-28 ENCOUNTER — Encounter: Payer: Self-pay | Admitting: Internal Medicine

## 2015-11-28 VITALS — BP 140/90 | HR 92 | Temp 98.9°F | Resp 20 | Ht 63.5 in | Wt 178.0 lb

## 2015-11-28 DIAGNOSIS — Z0000 Encounter for general adult medical examination without abnormal findings: Secondary | ICD-10-CM

## 2015-11-28 DIAGNOSIS — E785 Hyperlipidemia, unspecified: Secondary | ICD-10-CM

## 2015-11-28 DIAGNOSIS — M797 Fibromyalgia: Secondary | ICD-10-CM

## 2015-11-28 DIAGNOSIS — I10 Essential (primary) hypertension: Secondary | ICD-10-CM

## 2015-11-28 MED ORDER — HYDROCODONE-ACETAMINOPHEN 5-325 MG PO TABS: ORAL_TABLET | ORAL | Status: DC

## 2015-11-28 NOTE — Progress Notes (Signed)
Pre visit review using our clinic review tool, if applicable. No additional management support is needed unless otherwise documented below in the visit note. 

## 2015-11-28 NOTE — Patient Instructions (Signed)
Limit your sodium (Salt) intake  Please check your blood pressure on a regular basis.  If it is consistently greater than 150/90, please make an office appointment.  Take a calcium supplement, plus [phone] units of vitamin D    It is important that you exercise regularly, at least 20 minutes 3 to 4 times per week.  If you develop chest pain or shortness of breath seek  medical attention.  Schedule your mammogram.  Return in 6 months for follow-up

## 2015-11-28 NOTE — Progress Notes (Signed)
Patient ID: [name], female   DOB: [birth year], 60 y.o.   MRN: [id number]  Subjective:    Patient ID: [name], female    DOB: 1956/01/23, 60 y.o.   MRN: [id number]  HPI 60  year-old patient who is seen today for a preventive health examination.  Medical problems include fibromyalgia. She has history depression dyslipidemia and treated hypertension.  She has fibromyalgia and continues to take hydrocodone, clonidine, and Effexor on a regular basis.  She continues to have pain in the back, hip, shoulder and neck areas.  Today she also complains of some mild left lower quadrant discomfort that seems to be improving  Laboratory studies reviewed and were unremarkable   Past History:  Past Medical History: Hypertension Nephrolithiasis, hx of recurrent UTI's fibromyalgia, and chronic fatigue migraine headaches menopausal syndrome Low back pain history of concussion, 1999 Raynoud's collagenous colitis Hyperlipidemia Vitamin D deficiency bladder stones bursitis in shoulder  Family History:  father died of suicide death at 110.  History of dementia Family History of CAD Female 1st degree relative <50 Family History Hypertension: Father/ mother Family History of Stroke F 1st degree relative <60 Mother:   HTN, Osteoporosis PD age 46 History  of Inflammatory Bowel Disease: Brother with Crohn's No FH of Colon Cancer 4 brothers, one sister: h/o hepatitis C  Social History:  Married, grown children Illicit Drug Use - no Alcohol Use - no Patient gets regular exercise. walks 30 min a day 4-5 days a week  Daily Caffeine Use small cup of cofee  [employer] Patient is a former smoker. stopped 8/09 Smoking Status:  never  Colonoscopy 2009    Past Medical History  Diagnosis Date  . 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 DEPRESSIVE DISORDER 05/21/2010  . Diarrhea 11/13/2007  . HYPERLIPIDEMIA 12/13/2008  . HYPERTENSION 04/28/2007  . LOW BACK PAIN 12/02/2007  . NEPHROLITHIASIS, HX OF  04/28/2007  . Renal colic 02/12/2008  . UTI'S, RECURRENT 04/28/2007  . Fibromyalgia   . Migraine   . Bursitis of shoulder   . Bladder stones   . Raynaud disease     Social History   Social History  . Marital Status: Married    Spouse Name: N/A  . Number of Children: N/A  . Years of Education: N/A   Occupational History  . Not on file.   Social History Main Topics  . Smoking status: Former Smoker    Quit date: 07/09/2007  . Smokeless tobacco: Never Used  . Alcohol Use: No  . Drug Use: No  . Sexual Activity: Not on file   Other Topics Concern  . Not on file   Social History Narrative    Past Surgical History  Procedure Laterality Date  . Abdominal hysterectomy  1990    fiboids, prolapse    Family History  Problem Relation Age of Onset  . Hyperlipidemia Mother   . Hypertension Mother     Allergies  Allergen Reactions  . Codeine Sulfate     REACTION: unspecified  . Diphenhydramine Hcl     REACTION: side effect of being irritable  . Moxifloxacin     REACTION: unspecified  . Penicillins     REACTION: unspecified  . Sulfonamide Derivatives   . Tramadol Hcl     REACTION: unspecified    Current Outpatient Prescriptions on File Prior to Visit  Medication Sig Dispense Refill  . aspirin 81 MG tablet Take 81 mg by mouth daily.      Marland Kitchen atorvastatin (LIPITOR) 40 MG  tablet Take 1 tablet by mouth  daily 90 tablet 1  . Cholecalciferol (VITAMIN D) 1000 UNITS capsule Take 1,000 Units by mouth daily.      . clonazePAM (KLONOPIN) 0.5 MG tablet Take 1 tablet (0.5 mg total) by mouth at bedtime as needed for anxiety. 180 tablet 1  . loperamide (IMODIUM) 2 MG capsule Take 1 capsule (2 mg total) by mouth 4 (four) times daily as needed. 30 capsule 6  . potassium chloride SA (K-DUR,KLOR-CON) 20 MEQ tablet Take 1 tablet by mouth two  times daily 180 tablet 1  . promethazine (PHENERGAN) 25 MG tablet Take 1 tablet by mouth  every 6 hours as needed for nausea 360 tablet 1  .  triamcinolone cream (KENALOG) 0.1 % Apply 1 application topically 3 (three) times daily.    Marland Kitchen. venlafaxine (EFFEXOR) 37.5 MG tablet Take 1 tablet by mouth  twice a day 180 tablet 1  . verapamil (CALAN-SR) 240 MG CR tablet Take 1 tablet by mouth at  bedtime 90 tablet 1  . zolpidem (AMBIEN) 10 MG tablet Take 1 tablet (10 mg total) by mouth at bedtime as needed. 90 tablet 1   No current facility-administered medications on file prior to visit.    BP 140/90 mmHg  Pulse 92  Temp(Src) 98.9 F (37.2 C) (Oral)  Resp 20  Ht 5' 3.5" (1.613 m)  Wt 178 lb (80.74 kg)  BMI 31.03 kg/m2  SpO2 97%      Review of Systems  Constitutional: Negative.   HENT: Negative for congestion, dental problem, hearing loss, rhinorrhea, sinus pressure, sore throat and tinnitus.   Eyes: Negative for pain, discharge and visual disturbance.  Respiratory: Negative for cough and shortness of breath.   Cardiovascular: Negative for chest pain, palpitations and leg swelling.  Gastrointestinal: Negative for nausea, vomiting, abdominal pain, diarrhea, constipation, blood in stool and abdominal distention.  Genitourinary: Negative for dysuria, urgency, frequency, hematuria, flank pain, vaginal bleeding, vaginal discharge, difficulty urinating, vaginal pain and pelvic pain.  Musculoskeletal: Positive for myalgias. Negative for joint swelling, arthralgias and gait problem.  Skin: Negative for rash.  Neurological: Negative for dizziness, syncope, speech difficulty, weakness, numbness and headaches.  Hematological: Negative for adenopathy.  Psychiatric/Behavioral: Negative for behavioral problems, dysphoric mood and agitation. The patient is not nervous/anxious.        Objective:   Physical Exam  Constitutional: She is oriented to person, place, and time. She appears well-developed and well-nourished.  HENT:  Head: Normocephalic and atraumatic.  Right Ear: External ear normal.  Left Ear: External ear normal.   Mouth/Throat: Oropharynx is clear and moist.  Eyes: Conjunctivae and EOM are normal.  Neck: Normal range of motion. Neck supple. No JVD present. No thyromegaly present.  Cardiovascular: Normal rate, regular rhythm and normal heart sounds.   No murmur heard. Diminished right dorsalis pedis pulse   Pulmonary/Chest: Effort normal and breath sounds normal. She has no wheezes. She has no rales.  Abdominal: Soft. Bowel sounds are normal. She exhibits no distension and no mass. There is no tenderness. There is no rebound and no guarding.  Musculoskeletal: Normal range of motion. She exhibits no edema or tenderness.  Neurological: She is alert and oriented to person, place, and time. She has normal reflexes. No cranial nerve deficit. She exhibits normal muscle tone. Coordination normal.  Reflexes brisk and equal  Skin: Skin is warm and dry. Rash noted.  Psychiatric: She has a normal mood and affect. Her behavior is normal.  Assessment & Plan:    Preventive health examination Hypertension well controlled Dyslipidemia.  Low back pain, stable Fibromyalgia History of nephrolithiasis, stable  Recheck 6 months Medications updated Mammogram encouraged  Rogelia Boga, MD

## 2015-12-27 ENCOUNTER — Telehealth: Payer: Self-pay | Admitting: Internal Medicine

## 2015-12-27 NOTE — Telephone Encounter (Signed)
Pt need new Rx for Hydrocodone °

## 2015-12-29 MED ORDER — HYDROCODONE-ACETAMINOPHEN 5-325 MG PO TABS: ORAL_TABLET | ORAL | Status: DC

## 2015-12-29 NOTE — Telephone Encounter (Signed)
Pt notified Rx ready for pickup. Rx printed and signed.  

## 2016-01-10 ENCOUNTER — Other Ambulatory Visit: Payer: Self-pay | Admitting: Internal Medicine

## 2016-01-29 ENCOUNTER — Telehealth: Payer: Self-pay | Admitting: Internal Medicine

## 2016-01-29 MED ORDER — HYDROCODONE-ACETAMINOPHEN 5-325 MG PO TABS: ORAL_TABLET | ORAL | 0 refills | Status: DC

## 2016-01-29 NOTE — Telephone Encounter (Signed)
Pt is aware & rx is up front for pick up.

## 2016-01-29 NOTE — Telephone Encounter (Signed)
Pt request refill  °HYDROcodone-acetaminophen (NORCO/VICODIN) 5-325 MG tablet °

## 2016-01-29 NOTE — Telephone Encounter (Signed)
Rx printed for signature. 

## 2016-01-29 NOTE — Telephone Encounter (Signed)
Okay to refill? 

## 2016-02-22 ENCOUNTER — Telehealth: Payer: Self-pay | Admitting: Internal Medicine

## 2016-02-22 NOTE — Telephone Encounter (Signed)
Pt request refill  °HYDROcodone-acetaminophen (NORCO/VICODIN) 5-325 MG tablet °

## 2016-02-23 MED ORDER — HYDROCODONE-ACETAMINOPHEN 5-325 MG PO TABS: ORAL_TABLET | ORAL | 0 refills | Status: DC

## 2016-02-23 NOTE — Telephone Encounter (Signed)
Okay for refill?  

## 2016-02-26 NOTE — Telephone Encounter (Signed)
Informed patient Rx refill is up front and ready for pick up.

## 2016-03-22 ENCOUNTER — Other Ambulatory Visit: Payer: Self-pay | Admitting: *Deleted

## 2016-03-22 MED ORDER — ZOLPIDEM TARTRATE 10 MG PO TABS: 10.0000 mg | ORAL_TABLET | Freq: Every evening | ORAL | 1 refills | Status: DC | PRN

## 2016-03-22 MED ORDER — CLONAZEPAM 0.5 MG PO TABS: 0.5000 mg | ORAL_TABLET | Freq: Every evening | ORAL | 1 refills | Status: DC | PRN

## 2016-03-25 ENCOUNTER — Telehealth: Payer: Self-pay | Admitting: Internal Medicine

## 2016-03-25 MED ORDER — HYDROCODONE-ACETAMINOPHEN 5-325 MG PO TABS: ORAL_TABLET | ORAL | 0 refills | Status: DC

## 2016-03-25 NOTE — Telephone Encounter (Signed)
Pt notified Rx ready for pickup. Rx printed and signed.  

## 2016-03-25 NOTE — Telephone Encounter (Signed)
Pt needs new rx hydrocodone °

## 2016-04-19 ENCOUNTER — Telehealth: Payer: Self-pay | Admitting: Internal Medicine

## 2016-04-19 NOTE — Telephone Encounter (Signed)
Pt needs new rx hydrocodone °

## 2016-04-22 MED ORDER — HYDROCODONE-ACETAMINOPHEN 5-325 MG PO TABS: ORAL_TABLET | ORAL | 0 refills | Status: DC

## 2016-04-22 NOTE — Telephone Encounter (Signed)
Pt notified Rx ready for pickup. Rx printed and signed.  

## 2016-05-20 ENCOUNTER — Telehealth: Payer: Self-pay | Admitting: Internal Medicine

## 2016-05-20 NOTE — Telephone Encounter (Signed)
Pt request refill  °HYDROcodone-acetaminophen (NORCO/VICODIN) 5-325 MG tablet °

## 2016-05-21 MED ORDER — HYDROCODONE-ACETAMINOPHEN 5-325 MG PO TABS: ORAL_TABLET | ORAL | 0 refills | Status: DC

## 2016-05-21 NOTE — Telephone Encounter (Signed)
Pt notified Rx ready for pickup. Rx printed and signed.  

## 2016-05-27 ENCOUNTER — Ambulatory Visit: Payer: 59 | Admitting: Internal Medicine

## 2016-05-28 ENCOUNTER — Ambulatory Visit (INDEPENDENT_AMBULATORY_CARE_PROVIDER_SITE_OTHER): Payer: 59 | Admitting: Internal Medicine

## 2016-05-28 ENCOUNTER — Encounter: Payer: Self-pay | Admitting: Internal Medicine

## 2016-05-28 VITALS — BP 130/80 | HR 86 | Temp 98.9°F | Ht 63.5 in | Wt 178.5 lb

## 2016-05-28 DIAGNOSIS — Z23 Encounter for immunization: Secondary | ICD-10-CM

## 2016-05-28 DIAGNOSIS — E785 Hyperlipidemia, unspecified: Secondary | ICD-10-CM | POA: Diagnosis not present

## 2016-05-28 DIAGNOSIS — M797 Fibromyalgia: Secondary | ICD-10-CM

## 2016-05-28 DIAGNOSIS — I10 Essential (primary) hypertension: Secondary | ICD-10-CM | POA: Diagnosis not present

## 2016-05-28 NOTE — Patient Instructions (Signed)
Limit your sodium (Salt) intake    It is important that you exercise regularly, at least 20 minutes 3 to 4 times per week.  If you develop chest pain or shortness of breath seek  medical attention.  Please check your blood pressure on a regular basis.  If it is consistently greater than 150/90, please make an office appointment.  Return in 6 months for follow-up   

## 2016-05-28 NOTE — Progress Notes (Signed)
Subjective:    Patient ID: [name], female    DOB: 24-Aug-1955, 60 y.o.   MRN: [id number]  HPI  60 year old patient who is seen today for her biannual follow-up.  She has a history of hypertension and fibromyalgia. She has had some increasing fibromyalgia symptomatology that she relates to the cold weather and increase in stress.  She lost her job a couple of weeks ago. She does monitor home blood pressure readings occasionally with nice, but pressure control. She was seen 6 months ago for her annual exam.  Laboratory studies were unremarkable No new concerns or complaints.  Needs a flu vaccine  Past Medical History:  Diagnosis Date  . Bladder stones   . Bursitis of shoulder   . 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 DEPRESSIVE DISORDER 05/21/2010  . Diarrhea 11/13/2007  . Fibromyalgia   . HYPERLIPIDEMIA 12/13/2008  . HYPERTENSION 04/28/2007  . LOW BACK PAIN 12/02/2007  . Migraine   . NEPHROLITHIASIS, HX OF 04/28/2007  . Raynaud disease   . Renal colic 02/12/2008  . UTI'S, RECURRENT 04/28/2007     Social History   Social History  . Marital status: Married    Spouse name: N/A  . Number of children: N/A  . Years of education: N/A   Occupational History  . Not on file.   Social History Main Topics  . Smoking status: Former Smoker    Quit date: 07/09/2007  . Smokeless tobacco: Never Used  . Alcohol use No  . Drug use: No  . Sexual activity: Not on file   Other Topics Concern  . Not on file   Social History Narrative  . No narrative on file    Past Surgical History:  Procedure Laterality Date  . ABDOMINAL HYSTERECTOMY  1990   fiboids, prolapse    Family History  Problem Relation Age of Onset  . Hyperlipidemia Mother   . Hypertension Mother     Allergies  Allergen Reactions  . Codeine Sulfate     REACTION: unspecified  . Diphenhydramine Hcl     REACTION: side effect of being irritable  . Moxifloxacin     REACTION: unspecified  . Penicillins    REACTION: unspecified  . Sulfonamide Derivatives   . Tramadol Hcl     REACTION: unspecified    Current Outpatient Prescriptions on File Prior to Visit  Medication Sig Dispense Refill  . aspirin 81 MG tablet Take 81 mg by mouth daily.      Marland Kitchen. atorvastatin (LIPITOR) 40 MG tablet Take 1 tablet by mouth  daily 90 tablet 1  . Cholecalciferol (VITAMIN D) 1000 UNITS capsule Take 1,000 Units by mouth daily.      . clonazePAM (KLONOPIN) 0.5 MG tablet Take 1 tablet (0.5 mg total) by mouth at bedtime as needed for anxiety. 180 tablet 1  . HYDROcodone-acetaminophen (NORCO/VICODIN) 5-325 MG tablet TAKE ONE TABLET BY MOUTH EVERY 6 HOURS AS NEEDED FOR PAIN 60 tablet 0  . loperamide (IMODIUM) 2 MG capsule Take 1 capsule (2 mg total) by mouth 4 (four) times daily as needed. 30 capsule 6  . potassium chloride SA (K-DUR,KLOR-CON) 20 MEQ tablet Take 1 tablet by mouth two  times daily 180 tablet 1  . promethazine (PHENERGAN) 25 MG tablet Take 1 tablet by mouth  every 6 hours as needed for nausea 360 tablet 1  . triamcinolone cream (KENALOG) 0.1 % Apply 1 application topically 3 (three) times daily.    Marland Kitchen. venlafaxine (EFFEXOR) 37.5 MG tablet Take 1  tablet by mouth  twice a day 180 tablet 1  . verapamil (CALAN-SR) 240 MG CR tablet Take 1 tablet by mouth at  bedtime 90 tablet 1  . zolpidem (AMBIEN) 10 MG tablet Take 1 tablet (10 mg total) by mouth at bedtime as needed. 90 tablet 1   No current facility-administered medications on file prior to visit.     BP 130/80 (BP Location: Left Arm, Patient Position: Sitting, Cuff Size: Normal)   Pulse 86   Temp 98.9 F (37.2 C) (Oral)   Ht 5' 3.5" (1.613 m)   Wt 178 lb 8 oz (81 kg)   SpO2 99%   BMI 31.12 kg/m     Review of Systems  Constitutional: Negative.   HENT: Negative for congestion, dental problem, hearing loss, rhinorrhea, sinus pressure, sore throat and tinnitus.   Eyes: Negative for pain, discharge and visual disturbance.  Respiratory: Negative for  cough and shortness of breath.   Cardiovascular: Negative for chest pain, palpitations and leg swelling.  Gastrointestinal: Negative for abdominal distention, abdominal pain, blood in stool, constipation, diarrhea, nausea and vomiting.  Genitourinary: Negative for difficulty urinating, dysuria, flank pain, frequency, hematuria, pelvic pain, urgency, vaginal bleeding, vaginal discharge and vaginal pain.  Musculoskeletal: Positive for back pain, myalgias, neck pain and neck stiffness. Negative for arthralgias, gait problem and joint swelling.  Skin: Negative for rash.  Neurological: Negative for dizziness, syncope, speech difficulty, weakness, numbness and headaches.  Hematological: Negative for adenopathy.  Psychiatric/Behavioral: Negative for agitation, behavioral problems and dysphoric mood. The patient is nervous/anxious.        Objective:   Physical Exam  Constitutional: She is oriented to person, place, and time. She appears well-developed and well-nourished.  HENT:  Head: Normocephalic.  Right Ear: External ear normal.  Left Ear: External ear normal.  Mouth/Throat: Oropharynx is clear and moist.  Eyes: Conjunctivae and EOM are normal. Pupils are equal, round, and reactive to light.  Neck: Normal range of motion. Neck supple. No thyromegaly present.  Cardiovascular: Normal rate, regular rhythm and normal heart sounds.   Decreased right dorsalis pedis pulse  Pulmonary/Chest: Effort normal and breath sounds normal.  Abdominal: Soft. Bowel sounds are normal. She exhibits no mass. There is no tenderness.  Musculoskeletal: Normal range of motion.  Lymphadenopathy:    She has no cervical adenopathy.  Neurological: She is alert and oriented to person, place, and time.  Skin: Skin is warm and dry. No rash noted.  Psychiatric: She has a normal mood and affect. Her behavior is normal.          Assessment & Plan:   Essential hypertension, well-controlled.  No change in medical  regimen Fibromyalgia with chronic low back pain History dyslipidemia.  Continue statin therapy Health maintenance.  Flu vaccine administered  Follow-up/CPX 6 months  Rogelia BogaKWIATKOWSKI,Cheral Cappucci FRANK

## 2016-05-28 NOTE — Progress Notes (Signed)
Pre visit review using our clinic review tool, if applicable. No additional management support is needed unless otherwise documented below in the visit note. 

## 2016-05-29 ENCOUNTER — Other Ambulatory Visit: Payer: Self-pay | Admitting: Internal Medicine

## 2016-06-03 ENCOUNTER — Telehealth: Payer: Self-pay | Admitting: Internal Medicine

## 2016-06-03 NOTE — Telephone Encounter (Signed)
Pt needs new rx hydrocodone °

## 2016-06-04 NOTE — Telephone Encounter (Signed)
Spoke to pt, told her she is not due for refill yet just got Rx on 11/14. Pt verbalized understanding and stated I know thought I could get another Rx before the end of the month. Pt said she loses insurance end of the month. Told her sorry can not refill. Pt verbalized understanding.

## 2016-06-18 ENCOUNTER — Telehealth: Payer: Self-pay | Admitting: Internal Medicine

## 2016-06-18 MED ORDER — HYDROCODONE-ACETAMINOPHEN 5-325 MG PO TABS: ORAL_TABLET | ORAL | 0 refills | Status: DC

## 2016-06-18 NOTE — Telephone Encounter (Signed)
Pt needs new hydrocodone. Pt husband Chrissie Noawilliam will pick up

## 2016-06-18 NOTE — Telephone Encounter (Signed)
Pt notified Rx ready for pickup. Rx printed and signed.  

## 2016-07-15 ENCOUNTER — Telehealth: Payer: Self-pay | Admitting: Internal Medicine

## 2016-07-15 NOTE — Telephone Encounter (Signed)
Will print out Rx on 07/17/15, too early.

## 2016-07-15 NOTE — Telephone Encounter (Signed)
Pt request refill  °HYDROcodone-acetaminophen (NORCO/VICODIN) 5-325 MG tablet °

## 2016-07-16 ENCOUNTER — Other Ambulatory Visit: Payer: Self-pay | Admitting: Internal Medicine

## 2016-07-16 MED ORDER — HYDROCODONE-ACETAMINOPHEN 5-325 MG PO TABS: ORAL_TABLET | ORAL | 0 refills | Status: DC

## 2016-07-16 NOTE — Telephone Encounter (Signed)
Pt notified Rx ready for pickup. Rx printed and signed.  

## 2016-08-13 ENCOUNTER — Telehealth: Payer: Self-pay | Admitting: Internal Medicine

## 2016-08-13 NOTE — Telephone Encounter (Signed)
° ° ° ° ° °  Pt request refill of the following: ° °HYDROcodone-acetaminophen (NORCO/VICODIN) 5-325 MG tablet ° ° °Phamacy: °

## 2016-08-14 MED ORDER — HYDROCODONE-ACETAMINOPHEN 5-325 MG PO TABS: ORAL_TABLET | ORAL | 0 refills | Status: DC

## 2016-08-14 NOTE — Telephone Encounter (Signed)
Rx printed

## 2016-08-14 NOTE — Telephone Encounter (Signed)
Pt notified Rx ready for pickup. Rx printed and signed.  

## 2016-09-13 ENCOUNTER — Telehealth: Payer: Self-pay | Admitting: Internal Medicine

## 2016-09-13 NOTE — Telephone Encounter (Signed)
Pt request refill  °HYDROcodone-acetaminophen (NORCO/VICODIN) 5-325 MG tablet °

## 2016-09-17 MED ORDER — HYDROCODONE-ACETAMINOPHEN 5-325 MG PO TABS: ORAL_TABLET | ORAL | 0 refills | Status: DC

## 2016-09-17 NOTE — Telephone Encounter (Signed)
Rx printed awaiting to be signed.  

## 2016-09-18 NOTE — Telephone Encounter (Signed)
Pt notified Rx ready for pickup. Rx printed and signed.  

## 2016-10-11 ENCOUNTER — Telehealth: Payer: Self-pay | Admitting: Internal Medicine

## 2016-10-11 ENCOUNTER — Other Ambulatory Visit: Payer: Self-pay | Admitting: Internal Medicine

## 2016-10-11 MED ORDER — ATORVASTATIN CALCIUM 40 MG PO TABS: 40.0000 mg | ORAL_TABLET | Freq: Every day | ORAL | 1 refills | Status: DC

## 2016-10-11 MED ORDER — CLONAZEPAM 0.5 MG PO TABS: 0.5000 mg | ORAL_TABLET | Freq: Every evening | ORAL | 0 refills | Status: DC | PRN

## 2016-10-11 NOTE — Telephone Encounter (Signed)
Pt need new Rx for Hydrocodone  Pt is aware Dr. Kirtland Bouchard is not in the office and of the 3 business days for refills and someone will call when ready for pick up.    Pt need new Rx for atorvastatin #30 and clonazepam #30   Pharm:  [facility] on [address].  Would like to have this before her insurance runs out if possible by this weekend.

## 2016-10-11 NOTE — Telephone Encounter (Signed)
Hydrocodone was last refilled on 09/17/2016. Too soon to refill may refill after 10/18/16.   Will refill other medications for pt to pharmacy

## 2016-10-18 ENCOUNTER — Telehealth: Payer: Self-pay | Admitting: Internal Medicine

## 2016-10-18 MED ORDER — HYDROCODONE-ACETAMINOPHEN 5-325 MG PO TABS: ORAL_TABLET | ORAL | 0 refills | Status: DC

## 2016-10-18 MED ORDER — VERAPAMIL HCL ER 240 MG PO TBCR: 240.0000 mg | EXTENDED_RELEASE_TABLET | Freq: Every day | ORAL | 1 refills | Status: DC

## 2016-10-18 NOTE — Telephone Encounter (Signed)
Pt notified Rx ready for pickup. Rx printed and signed.  

## 2016-10-18 NOTE — Telephone Encounter (Signed)
° ° °  Pt request refill of the following:  HYDROcodone-acetaminophen (NORCO/VICODIN) 5-325 MG    verapamil (CALAN-SR) 240 MG CR tablet    [facility]:  [facility]  [address]

## 2016-10-18 NOTE — Telephone Encounter (Signed)
Rx printed awaiting to be signed.  

## 2016-10-28 ENCOUNTER — Telehealth: Payer: Self-pay | Admitting: Internal Medicine

## 2016-10-28 MED ORDER — VENLAFAXINE HCL 37.5 MG PO TABS: 37.5000 mg | ORAL_TABLET | Freq: Two times a day (BID) | ORAL | 0 refills | Status: DC

## 2016-10-28 MED ORDER — ZOLPIDEM TARTRATE 10 MG PO TABS: 10.0000 mg | ORAL_TABLET | Freq: Every evening | ORAL | 0 refills | Status: DC | PRN

## 2016-10-28 NOTE — Telephone Encounter (Signed)
Printed to fax  

## 2016-10-28 NOTE — Telephone Encounter (Signed)
Effexor sent electronically. Ambien faxed and received confirmation that the fax was successful.

## 2016-10-28 NOTE — Telephone Encounter (Signed)
Okay for refill?  

## 2016-10-28 NOTE — Telephone Encounter (Signed)
Pt request refill   zolpidem (AMBIEN) 10 MG tablet venlafaxine (EFFEXOR) 37.5 MG tablet  [facility] [address] [place], [place] - [address] [place]

## 2016-10-28 NOTE — Telephone Encounter (Signed)
Ambien last filled 03/22/16 #90 with 1 refill Last seen 05/28/16 Has appt scheduled 12/04/16 for cpx Please advise.  Thanks!!

## 2016-11-14 ENCOUNTER — Telehealth: Payer: Self-pay | Admitting: Internal Medicine

## 2016-11-14 NOTE — Telephone Encounter (Signed)
° °  Pt request refill of the following:  HYDROcodone-acetaminophen (NORCO/VICODIN) 5-325 MG tablet  clonazePAM (KLONOPIN) 0.5 MG tablet    Phamacy:

## 2016-11-18 MED ORDER — HYDROCODONE-ACETAMINOPHEN 5-325 MG PO TABS: ORAL_TABLET | ORAL | 0 refills | Status: DC

## 2016-11-18 MED ORDER — CLONAZEPAM 0.5 MG PO TABS: 0.5000 mg | ORAL_TABLET | Freq: Every evening | ORAL | 0 refills | Status: DC | PRN

## 2016-11-18 NOTE — Telephone Encounter (Signed)
Okay, Vicodin No. 60 Okay Klonopin  #30

## 2016-11-18 NOTE — Telephone Encounter (Signed)
Dr. Kirtland BouchardK - Please advise on refills. Thanks!

## 2016-11-18 NOTE — Telephone Encounter (Signed)
Rx's printed for signature.  

## 2016-11-22 ENCOUNTER — Other Ambulatory Visit: Payer: 59

## 2016-11-26 ENCOUNTER — Ambulatory Visit: Payer: 59 | Admitting: Internal Medicine

## 2016-11-27 ENCOUNTER — Other Ambulatory Visit: Payer: Self-pay | Admitting: Internal Medicine

## 2016-11-29 ENCOUNTER — Encounter: Payer: 59 | Admitting: Internal Medicine

## 2016-12-04 ENCOUNTER — Encounter: Payer: Self-pay | Admitting: Internal Medicine

## 2016-12-04 ENCOUNTER — Ambulatory Visit (INDEPENDENT_AMBULATORY_CARE_PROVIDER_SITE_OTHER): Payer: 59 | Admitting: Internal Medicine

## 2016-12-04 ENCOUNTER — Other Ambulatory Visit: Payer: Self-pay | Admitting: Internal Medicine

## 2016-12-04 VITALS — BP 124/70 | HR 74 | Temp 98.3°F | Ht 64.5 in | Wt 176.4 lb

## 2016-12-04 DIAGNOSIS — Z0000 Encounter for general adult medical examination without abnormal findings: Secondary | ICD-10-CM | POA: Diagnosis not present

## 2016-12-04 LAB — TSH: TSH: 1.4 u[IU]/mL (ref 0.35–4.50)

## 2016-12-04 LAB — LIPID PANEL
CHOLESTEROL: 186 mg/dL (ref 0–200)
HDL: 58.7 mg/dL (ref >39.00)
LDL Cholesterol: 102 mg/dL — ABNORMAL HIGH (ref 0–99)
NONHDL: 127.26
TRIGLYCERIDES: 126 mg/dL (ref 0.0–149.0)
Total CHOL/HDL Ratio: 3
VLDL: 25.2 mg/dL (ref 0.0–40.0)

## 2016-12-04 LAB — COMPREHENSIVE METABOLIC PANEL
ALK PHOS: 80 U/L (ref 39–117)
ALT: 22 U/L (ref 0–35)
AST: 22 U/L (ref 0–37)
Albumin: 4.4 g/dL (ref 3.5–5.2)
BILIRUBIN TOTAL: 0.3 mg/dL (ref 0.2–1.2)
BUN: 15 mg/dL (ref 6–23)
CO2: 25 mEq/L (ref 19–32)
Calcium: 9.3 mg/dL (ref 8.4–10.5)
Chloride: 106 mEq/L (ref 96–112)
Creatinine, Ser: 1.11 mg/dL (ref 0.40–1.20)
GFR: 53.47 mL/min — AB (ref >60.00)
GLUCOSE: 98 mg/dL (ref 70–99)
Potassium: 4.3 mEq/L (ref 3.5–5.1)
Sodium: 139 mEq/L (ref 135–145)
TOTAL PROTEIN: 7.1 g/dL (ref 6.0–8.3)

## 2016-12-04 LAB — CBC WITH DIFFERENTIAL/PLATELET
Basophils Absolute: 0.1 10*3/uL (ref 0.0–0.1)
Basophils Relative: 1.1 % (ref 0.0–3.0)
EOS ABS: 0.2 10*3/uL (ref 0.0–0.7)
Eosinophils Relative: 2.7 % (ref 0.0–5.0)
HEMATOCRIT: 40.8 % (ref 36.0–46.0)
HEMOGLOBIN: 13.5 g/dL (ref 12.0–15.0)
Lymphocytes Relative: 32.6 % (ref 12.0–46.0)
Lymphs Abs: 2.1 10*3/uL (ref 0.7–4.0)
MCHC: 33.1 g/dL (ref 30.0–36.0)
MCV: 90.4 fl (ref 78.0–100.0)
MONOS PCT: 4.8 % (ref 3.0–12.0)
Monocytes Absolute: 0.3 10*3/uL (ref 0.1–1.0)
Neutro Abs: 3.9 10*3/uL (ref 1.4–7.7)
Neutrophils Relative %: 58.8 % (ref 43.0–77.0)
PLATELETS: 277 10*3/uL (ref 150.0–400.0)
RBC: 4.51 Mil/uL (ref 3.87–5.11)
RDW: 12.9 % (ref 11.5–15.5)
WBC: 6.6 10*3/uL (ref 4.0–10.5)

## 2016-12-04 LAB — VITAMIN D 25 HYDROXY (VIT D DEFICIENCY, FRACTURES): VITD: 38.66 ng/mL (ref 30.00–100.00)

## 2016-12-04 NOTE — Patient Instructions (Addendum)
WE NOW OFFER   [name] FAST TRACK!!!  SAME DAY Appointments for ACUTE CARE  Such as: Sprains, Injuries, cuts, abrasions, rashes, muscle pain, joint pain, back pain Colds, flu, sore throats, headache, allergies, cough, fever  Ear pain, sinus and eye infections Abdominal pain, nausea, vomiting, diarrhea, upset stomach Animal/insect bites  3 Easy Ways to Schedule: Walk-In Scheduling Call in scheduling Mychart Sign-up: [email or web address]   Limit your sodium (Salt) intake  Please check your blood pressure on a regular basis.  If it is consistently greater than 150/90, please make an office appointment.    It is important that you exercise regularly, at least 20 minutes 3 to 4 times per week.  If you develop chest pain or shortness of breath seek  medical attention.  You need to lose weight.  Consider a lower calorie diet and regular exercise.        Health Maintenance for Postmenopausal Women Menopause is a normal process in which your reproductive ability comes to an end. This process happens gradually over a span of months to years, usually between the ages of 42 and 62. Menopause is complete when you have missed 12 consecutive menstrual periods. It is important to talk with your health care provider about some of the most common conditions that affect postmenopausal women, such as heart disease, cancer, and bone loss (osteoporosis). Adopting a healthy lifestyle and getting preventive care can help to promote your health and wellness. Those actions can also lower your chances of developing some of these common conditions. What should I know about menopause? During menopause, you may experience a number of symptoms, such as:  Moderate-to-severe hot flashes.  Night sweats.  Decrease in sex drive.  Mood swings.  Headaches.  Tiredness.  Irritability.  Memory problems.  Insomnia. Choosing to treat or not to treat menopausal changes is an  individual decision that you make with your health care provider. What should I know about hormone replacement therapy and supplements? Hormone therapy products are effective for treating symptoms that are associated with menopause, such as hot flashes and night sweats. Hormone replacement carries certain risks, especially as you become older. If you are thinking about using estrogen or estrogen with progestin treatments, discuss the benefits and risks with your health care provider. What should I know about heart disease and stroke? Heart disease, heart attack, and stroke become more likely as you age. This may be due, in part, to the hormonal changes that your body experiences during menopause. These can affect how your body processes dietary fats, triglycerides, and cholesterol. Heart attack and stroke are both medical emergencies. There are many things that you can do to help prevent heart disease and stroke:  Have your blood pressure checked at least every 1-2 years. High blood pressure causes heart disease and increases the risk of stroke.  If you are 62-34 years old, ask your health care provider if you should take aspirin to prevent a heart attack or a stroke.  Do not use any tobacco products, including cigarettes, chewing tobacco, or electronic cigarettes. If you need help quitting, ask your health care provider.  It is important to eat a healthy diet and maintain a healthy weight.  Be sure to include plenty of vegetables, fruits, low-fat dairy products, and lean protein.  Avoid eating foods that are high in solid fats, added sugars, or salt (sodium).  Get regular exercise. This is one of the most important things that you can do for your health.  Try to  exercise for at least 150 minutes each week. The type of exercise that you do should increase your heart rate and make you sweat. This is known as moderate-intensity exercise.  Try to do strengthening exercises at least twice each  week. Do these in addition to the moderate-intensity exercise.  Know your numbers.Ask your health care provider to check your cholesterol and your blood glucose. Continue to have your blood tested as directed by your health care provider. What should I know about cancer screening? There are several types of cancer. Take the following steps to reduce your risk and to catch any cancer development as early as possible. Breast Cancer  Practice breast self-awareness.  This means understanding how your breasts normally appear and feel.  It also means doing regular breast self-exams. Let your health care provider know about any changes, no matter how small.  If you are 73 or older, have a clinician do a breast exam (clinical breast exam or CBE) every year. Depending on your age, family history, and medical history, it may be recommended that you also have a yearly breast X-ray (mammogram).  If you have a family history of breast cancer, talk with your health care provider about genetic screening.  If you are at high risk for breast cancer, talk with your health care provider about having an MRI and a mammogram every year.  Breast cancer (BRCA) gene test is recommended for women who have family members with BRCA-related cancers. Results of the assessment will determine the need for genetic counseling and BRCA1 and for BRCA2 testing. BRCA-related cancers include these types:  Breast. This occurs in males or females.  Ovarian.  Tubal. This may also be called fallopian tube cancer.  Cancer of the abdominal or pelvic lining (peritoneal cancer).  Prostate.  Pancreatic. Cervical, Uterine, and Ovarian Cancer  Your health care provider may recommend that you be screened regularly for cancer of the pelvic organs. These include your ovaries, uterus, and vagina. This screening involves a pelvic exam, which includes checking for microscopic changes to the surface of your cervix (Pap test).  For women  ages 21-65, health care providers may recommend a pelvic exam and a Pap test every three years. For women ages 63-65, they may recommend the Pap test and pelvic exam, combined with testing for human papilloma virus (HPV), every five years. Some types of HPV increase your risk of cervical cancer. Testing for HPV may also be done on women of any age who have unclear Pap test results.  Other health care providers may not recommend any screening for nonpregnant women who are considered low risk for pelvic cancer and have no symptoms. Ask your health care provider if a screening pelvic exam is right for you.  If you have had past treatment for cervical cancer or a condition that could lead to cancer, you need Pap tests and screening for cancer for at least 20 years after your treatment. If Pap tests have been discontinued for you, your risk factors (such as having a new sexual partner) need to be reassessed to determine if you should start having screenings again. Some women have medical problems that increase the chance of getting cervical cancer. In these cases, your health care provider may recommend that you have screening and Pap tests more often.  If you have a family history of uterine cancer or ovarian cancer, talk with your health care provider about genetic screening.  If you have vaginal bleeding after reaching menopause, tell your health  care provider.  There are currently no reliable tests available to screen for ovarian cancer. Lung Cancer  Lung cancer screening is recommended for adults 19-72 years old who are at high risk for lung cancer because of a history of smoking. A yearly low-dose CT scan of the lungs is recommended if you:  Currently smoke.  Have a history of at least 30 pack-years of smoking and you currently smoke or have quit within the past 15 years. A pack-year is smoking an average of one pack of cigarettes per day for one year. Yearly screening should:  Continue until it  has been 15 years since you quit.  Stop if you develop a health problem that would prevent you from having lung cancer treatment. Colorectal Cancer  This type of cancer can be detected and can often be prevented.  Routine colorectal cancer screening usually begins at age 108 and continues through age 55.  If you have risk factors for colon cancer, your health care provider may recommend that you be screened at an earlier age.  If you have a family history of colorectal cancer, talk with your health care provider about genetic screening.  Your health care provider may also recommend using home test kits to check for hidden blood in your stool.  A small camera at the end of a tube can be used to examine your colon directly (sigmoidoscopy or colonoscopy). This is done to check for the earliest forms of colorectal cancer.  Direct examination of the colon should be repeated every 5-10 years until age 82. However, if early forms of precancerous polyps or small growths are found or if you have a family history or genetic risk for colorectal cancer, you may need to be screened more often. Skin Cancer  Check your skin from head to toe regularly.  Monitor any moles. Be sure to tell your health care provider:  About any new moles or changes in moles, especially if there is a change in a mole's shape or color.  If you have a mole that is larger than the size of a pencil eraser.  If any of your family members has a history of skin cancer, especially at a young age, talk with your health care provider about genetic screening.  Always use sunscreen. Apply sunscreen liberally and repeatedly throughout the day.  Whenever you are outside, protect yourself by wearing long sleeves, pants, a wide-brimmed hat, and sunglasses. What should I know about osteoporosis? Osteoporosis is a condition in which bone destruction happens more quickly than new bone creation. After menopause, you may be at an increased  risk for osteoporosis. To help prevent osteoporosis or the bone fractures that can happen because of osteoporosis, the following is recommended:  If you are 66-89 years old, get at least 1,000 mg of calcium and at least 600 mg of vitamin D per day.  If you are older than age 3 but younger than age 75, get at least 1,200 mg of calcium and at least 600 mg of vitamin D per day.  If you are older than age 42, get at least 1,200 mg of calcium and at least 800 mg of vitamin D per day. Smoking and excessive alcohol intake increase the risk of osteoporosis. Eat foods that are rich in calcium and vitamin D, and do weight-bearing exercises several times each week as directed by your health care provider. What should I know about how menopause affects my mental health? Depression may occur at any age, but  it is more common as you become older. Common symptoms of depression include:  Low or sad mood.  Changes in sleep patterns.  Changes in appetite or eating patterns.  Feeling an overall lack of motivation or enjoyment of activities that you previously enjoyed.  Frequent crying spells. Talk with your health care provider if you think that you are experiencing depression. What should I know about immunizations? It is important that you get and maintain your immunizations. These include:  Tetanus, diphtheria, and pertussis (Tdap) booster vaccine.  Influenza every year before the flu season begins.  Pneumonia vaccine.  Shingles vaccine. Your health care provider may also recommend other immunizations. This information is not intended to replace advice given to you by your health care provider. Make sure you discuss any questions you have with your health care provider. Document Released: 08/16/2005 Document Revised: 01/12/2016 Document Reviewed: 03/28/2015 Elsevier Interactive Patient Education  2017 [employer].

## 2016-12-04 NOTE — Progress Notes (Signed)
Subjective:    Patient ID: [name], female    DOB: 1956/05/28, 61 y.o.   MRN: [id number]  HPI 61 year old patient who is seen today for a preventive examination She has a history of fibromyalgia and chronic back and neck discomfort. She has hypertension and dyslipidemia.  Past Medical History:  Diagnosis Date  . Bladder stones   . Bursitis of shoulder   . 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 DEPRESSIVE DISORDER 05/21/2010  . Diarrhea 11/13/2007  . Fibromyalgia   . HYPERLIPIDEMIA 12/13/2008  . HYPERTENSION 04/28/2007  . LOW BACK PAIN 12/02/2007  . Migraine   . NEPHROLITHIASIS, HX OF 04/28/2007  . Raynaud disease   . Renal colic 02/12/2008  . UTI'S, RECURRENT 04/28/2007     Social History   Social History  . Marital status: Married    Spouse name: N/A  . Number of children: N/A  . Years of education: N/A   Occupational History  . Not on file.   Social History Main Topics  . Smoking status: Former Smoker    Quit date: 07/09/2007  . Smokeless tobacco: Never Used  . Alcohol use No  . Drug use: No  . Sexual activity: Not on file   Other Topics Concern  . Not on file   Social History Narrative  . No narrative on file    Past Surgical History:  Procedure Laterality Date  . ABDOMINAL HYSTERECTOMY  1990   fiboids, prolapse    Family History  Problem Relation Age of Onset  . Hyperlipidemia Mother   . Hypertension Mother     Allergies  Allergen Reactions  . Codeine Sulfate     REACTION: unspecified  . Diphenhydramine Hcl     REACTION: side effect of being irritable  . Moxifloxacin     REACTION: unspecified  . Penicillins     REACTION: unspecified  . Sulfonamide Derivatives   . Tramadol Hcl     REACTION: unspecified    Current Outpatient Prescriptions on File Prior to Visit  Medication Sig Dispense Refill  . aspirin 81 MG tablet Take 81 mg by mouth daily.      Marland Kitchen atorvastatin (LIPITOR) 40 MG tablet Take 1 tablet (40 mg total) by mouth daily. 30 tablet 1   . Cholecalciferol (VITAMIN D) 1000 UNITS capsule Take 1,000 Units by mouth daily.      . clonazePAM (KLONOPIN) 0.5 MG tablet Take 1 tablet (0.5 mg total) by mouth at bedtime as needed for anxiety. 30 tablet 0  . HYDROcodone-acetaminophen (NORCO/VICODIN) 5-325 MG tablet TAKE ONE TABLET BY MOUTH EVERY 6 HOURS AS NEEDED FOR PAIN 60 tablet 0  . loperamide (IMODIUM) 2 MG capsule Take 1 capsule (2 mg total) by mouth 4 (four) times daily as needed. 30 capsule 6  . potassium chloride SA (K-DUR,KLOR-CON) 20 MEQ tablet TAKE 1 TABLET BY MOUTH TWO  TIMES DAILY 180 tablet 1  . promethazine (PHENERGAN) 25 MG tablet Take 1 tablet by mouth  every 6 hours as needed for nausea 360 tablet 1  . triamcinolone cream (KENALOG) 0.1 % Apply 1 application topically 3 (three) times daily.    Marland Kitchen venlafaxine (EFFEXOR) 37.5 MG tablet Take 1 tablet (37.5 mg total) by mouth 2 (two) times daily. 180 tablet 0  . verapamil (CALAN-SR) 240 MG CR tablet Take 1 tablet (240 mg total) by mouth at bedtime. 90 tablet 1  . zolpidem (AMBIEN) 10 MG tablet Take 1 tablet (10 mg total) by mouth at bedtime as needed. 90 tablet  0   No current facility-administered medications on file prior to visit.     BP 124/70 (BP Location: Left Arm, Patient Position: Sitting, Cuff Size: Normal)   Pulse 74   Temp 98.3 F (36.8 C) (Oral)   Ht 5' 4.5" (1.638 m)   Wt 176 lb 6.4 oz (80 kg)   SpO2 98%   BMI 29.81 kg/m      Review of Systems  Constitutional: Negative.   HENT: Negative for congestion, dental problem, hearing loss, rhinorrhea, sinus pressure, sore throat and tinnitus.   Eyes: Negative for pain, discharge and visual disturbance.  Respiratory: Negative for cough and shortness of breath.   Cardiovascular: Negative for chest pain, palpitations and leg swelling.  Gastrointestinal: Negative for abdominal distention, abdominal pain, blood in stool, constipation, diarrhea, nausea and vomiting.  Genitourinary: Negative for difficulty urinating,  dysuria, flank pain, frequency, hematuria, pelvic pain, urgency, vaginal bleeding, vaginal discharge and vaginal pain.  Musculoskeletal: Positive for back pain, myalgias, neck pain and neck stiffness. Negative for arthralgias, gait problem and joint swelling.  Skin: Negative for rash.  Neurological: Negative for dizziness, syncope, speech difficulty, weakness, numbness and headaches.  Hematological: Negative for adenopathy.  Psychiatric/Behavioral: Negative for agitation, behavioral problems and dysphoric mood. The patient is not nervous/anxious.        Objective:   Physical Exam  Constitutional: She is oriented to person, place, and time. She appears well-developed and well-nourished.  HENT:  Head: Normocephalic and atraumatic.  Right Ear: External ear normal.  Left Ear: External ear normal.  Mouth/Throat: Oropharynx is clear and moist.  Eyes: Conjunctivae and EOM are normal.  Neck: Normal range of motion. Neck supple. No JVD present. No thyromegaly present.  Cardiovascular: Normal rate, regular rhythm, normal heart sounds and intact distal pulses.   No murmur heard. Right dorsalis pedis pulse diminished  Pulmonary/Chest: Effort normal and breath sounds normal. She has no wheezes. She has no rales.  Abdominal: Soft. Bowel sounds are normal. She exhibits no distension and no mass. There is no tenderness. There is no rebound and no guarding.  Musculoskeletal: Normal range of motion. She exhibits no edema or tenderness.  Neurological: She is alert and oriented to person, place, and time. She has normal reflexes. No cranial nerve deficit. She exhibits normal muscle tone. Coordination normal.  Skin: Skin is warm and dry. No rash noted.  Psychiatric: She has a normal mood and affect. Her behavior is normal.          Assessment & Plan:   Preventive health examination Essential hypertension, well-controlled Fibromyalgia Low back pain Dyslipidemia.  Continue statin therapy  Low-salt  diet, modest weight loss.  More rigorous exercise.  All discussed and encouraged Mammogram encouraged  Follow-up 6 months  [name] [name]

## 2016-12-05 ENCOUNTER — Other Ambulatory Visit: Payer: Self-pay | Admitting: Internal Medicine

## 2016-12-05 MED ORDER — CLONAZEPAM 0.5 MG PO TABS: 0.5000 mg | ORAL_TABLET | Freq: Every evening | ORAL | 1 refills | Status: DC | PRN

## 2016-12-06 ENCOUNTER — Telehealth: Payer: Self-pay

## 2016-12-06 NOTE — Telephone Encounter (Signed)
Rx for clonazepam 0.5 mg was faxed to patient pharmacy [facility], [phone] with 1 refill. Fax confirmation received.

## 2016-12-18 ENCOUNTER — Other Ambulatory Visit: Payer: Self-pay

## 2016-12-19 MED ORDER — HYDROCODONE-ACETAMINOPHEN 5-325 MG PO TABS: ORAL_TABLET | ORAL | 0 refills | Status: DC

## 2016-12-19 NOTE — Telephone Encounter (Signed)
Okay to refill? 

## 2016-12-24 ENCOUNTER — Telehealth: Payer: Self-pay | Admitting: Family Medicine

## 2016-12-24 NOTE — Telephone Encounter (Signed)
Received form.  Filled out and will pass to Dr. Kirtland BouchardK today.

## 2016-12-26 NOTE — Telephone Encounter (Signed)
Form faxed and received confirmation the transaction was successful.

## 2017-01-20 ENCOUNTER — Telehealth: Payer: Self-pay | Admitting: Internal Medicine

## 2017-01-20 MED ORDER — HYDROCODONE-ACETAMINOPHEN 5-325 MG PO TABS
ORAL_TABLET | ORAL | 0 refills | Status: DC
Start: 2017-01-20 — End: 2017-02-20

## 2017-01-20 NOTE — Telephone Encounter (Signed)
Pt notified Rx ready for pickup. Rx printed and signed.  

## 2017-01-20 NOTE — Telephone Encounter (Signed)
Rx printed awaiting to be signed.  

## 2017-01-20 NOTE — Telephone Encounter (Signed)
° ° ° ° ° °  Pt request refill of the following: ° °HYDROcodone-acetaminophen (NORCO/VICODIN) 5-325 MG tablet ° ° °Phamacy: °

## 2017-01-27 ENCOUNTER — Other Ambulatory Visit: Payer: Self-pay | Admitting: Internal Medicine

## 2017-01-31 ENCOUNTER — Other Ambulatory Visit: Payer: Self-pay | Admitting: Internal Medicine

## 2017-02-18 ENCOUNTER — Telehealth: Payer: Self-pay | Admitting: Internal Medicine

## 2017-02-18 NOTE — Telephone Encounter (Signed)
Pt request refill  °HYDROcodone-acetaminophen (NORCO/VICODIN) 5-325 MG tablet °

## 2017-02-18 NOTE — Telephone Encounter (Signed)
Please advise 

## 2017-02-18 NOTE — Telephone Encounter (Signed)
Pt [facility]. Pt states her A1C result was put in the space instead of her glucose number.  Also . needs to have date this was screening was preformed on the form.  Pt states corrected form has not been received and they need by a certain date. If you could please resend.

## 2017-02-18 NOTE — Telephone Encounter (Signed)
Form in chart.  See media

## 2017-02-18 NOTE — Telephone Encounter (Signed)
HYDROcodone-acetaminophen (NORCO/VICODIN) 5-325 MG tablet #60 tablet was refilled on: 01/20/2017 Rx must last 30 days. May refill Rx on or after 02/20/17

## 2017-02-20 MED ORDER — HYDROCODONE-ACETAMINOPHEN 5-325 MG PO TABS
ORAL_TABLET | ORAL | 0 refills | Status: DC
Start: 2017-02-20 — End: 2017-03-24

## 2017-02-20 NOTE — Telephone Encounter (Signed)
Rx was printed, awaiting to be signed by MD

## 2017-02-21 NOTE — Telephone Encounter (Signed)
Pt notified Rx ready for pickup. Rx printed and signed.  

## 2017-02-21 NOTE — Telephone Encounter (Signed)
Pt will come in to offcie to pick up form.

## 2017-03-24 ENCOUNTER — Telehealth: Payer: Self-pay | Admitting: Internal Medicine

## 2017-03-24 MED ORDER — HYDROCODONE-ACETAMINOPHEN 5-325 MG PO TABS: ORAL_TABLET | ORAL | 0 refills | Status: DC

## 2017-03-24 NOTE — Telephone Encounter (Signed)
Pt request refill  °HYDROcodone-acetaminophen (NORCO/VICODIN) 5-325 MG tablet °

## 2017-03-24 NOTE — Telephone Encounter (Signed)
Rx printed awaiting to be signed.  

## 2017-03-25 NOTE — Telephone Encounter (Signed)
Pt notified Rx ready for pickup. Rx printed and signed.  

## 2017-03-27 ENCOUNTER — Encounter: Payer: Self-pay | Admitting: Internal Medicine

## 2017-04-01 ENCOUNTER — Other Ambulatory Visit: Payer: Self-pay | Admitting: Internal Medicine

## 2017-04-03 ENCOUNTER — Other Ambulatory Visit: Payer: Self-pay | Admitting: Internal Medicine

## 2017-04-03 MED ORDER — CLONAZEPAM 0.5 MG PO TABS: 0.5000 mg | ORAL_TABLET | Freq: Every evening | ORAL | 1 refills | Status: DC | PRN

## 2017-04-22 ENCOUNTER — Telehealth: Payer: Self-pay | Admitting: *Deleted

## 2017-04-22 NOTE — Telephone Encounter (Signed)
Patient called requesting a refill on hydrocodone. Please advise [phone]

## 2017-04-23 NOTE — Telephone Encounter (Signed)
The below letter was mailed to pt's home address.   "Dear patients, The Strengthen Opioid Misuse Prevention (STOP) Act of 2017 has been signed into law to fight the opioid problem that has had a major impact in  and the [place].  [facility] wants to be sure to follow the law while we continue to provide you with the exceptional care you have come to expect.   For most of you, nothing will change.  Those of you who get a regular long-term pain management prescription from one of our providers will need to schedule a separate pain management appointment.  At this appointment, your provider will talk you through the changes we have had to begin because of the STOP Act.  It's the law.  What this means for you is that now you will need to have a separate pain management visit with your provider at least every 3 months.  You will also need to sign an updated controlled substance contract that will be discussed with you in detail during your visit.   We know this change can be confusing and uncomfortable, but we are here to help you every step of the way.  Please contact us today to set up your pain management appointment.    Sincerely,   [facility] " 

## 2017-04-23 NOTE — Telephone Encounter (Signed)
Pt stated she would call back to schedule appt at a later time.

## 2017-04-29 ENCOUNTER — Encounter: Payer: Self-pay | Admitting: Internal Medicine

## 2017-04-29 ENCOUNTER — Ambulatory Visit (INDEPENDENT_AMBULATORY_CARE_PROVIDER_SITE_OTHER): Payer: 59 | Admitting: Internal Medicine

## 2017-04-29 VITALS — BP 122/78 | HR 77 | Temp 98.6°F | Ht 64.0 in | Wt 174.0 lb

## 2017-04-29 DIAGNOSIS — Z79899 Other long term (current) drug therapy: Secondary | ICD-10-CM | POA: Diagnosis not present

## 2017-04-29 DIAGNOSIS — G8929 Other chronic pain: Secondary | ICD-10-CM | POA: Diagnosis not present

## 2017-04-29 DIAGNOSIS — M797 Fibromyalgia: Secondary | ICD-10-CM

## 2017-04-29 DIAGNOSIS — Z23 Encounter for immunization: Secondary | ICD-10-CM

## 2017-04-29 DIAGNOSIS — M545 Low back pain: Secondary | ICD-10-CM

## 2017-04-29 MED ORDER — HYDROCODONE-ACETAMINOPHEN 5-325 MG PO TABS: ORAL_TABLET | ORAL | 0 refills | Status: DC

## 2017-04-29 MED ORDER — CLONAZEPAM 0.5 MG PO TABS: 0.5000 mg | ORAL_TABLET | Freq: Every evening | ORAL | 1 refills | Status: DC | PRN

## 2017-04-29 NOTE — Patient Instructions (Signed)
Return as scheduled for follow-up  Limit your sodium (Salt) intake  Please check your blood pressure on a regular basis.  If it is consistently greater than 150/90, please make an office appointment.    It is important that you exercise regularly, at least 20 minutes 3 to 4 times per week.  If you develop chest pain or shortness of breath seek  medical attention.

## 2017-04-29 NOTE — Progress Notes (Signed)
   Subjective:    Patient ID: Beecher McardleJoann L Mcconico, female    DOB: 03-15-1956, 61 y.o.   MRN: [id number]  HPI    61 year old patient seen today for pain management evaluation per opioid protocol  [place] printed, initialed  and scanned into the EMR. New controlled drug contract reviewed and signed  Indication for chronic opioid: patient has a history of fibromyalgia and chronic low back pain.  She generally takes hydrocodone 3 times daily for neck, back, hip and leg pain.  She also takes a bedtime dose of Klonopin Medication and dose: hydrocodone 5-acetaminophen 325 3 times daily # pills per month:90 Last UDS date: 04/29/2017 Pain contract signed (Y/N): yes Date narcotic database last reviewed (include red flags): 04/29/2017    Review of Systems     Objective:   Physical Exam        Assessment & Plan:    Encounter for chronic pain management (G89.29) Narcotic use  (711.90) Pain management contract signed (Z02.89)  Rogelia BogaKWIATKOWSKI,Zebediah Beezley FRANK

## 2017-05-06 ENCOUNTER — Ambulatory Visit: Payer: 59 | Admitting: Internal Medicine

## 2017-06-09 ENCOUNTER — Telehealth: Payer: Self-pay | Admitting: *Deleted

## 2017-06-09 NOTE — Telephone Encounter (Signed)
[facility] faxed a note requesting prior auth for Hydrocodone-Acetaminophen 5/325.  I attempted obtaining a prior auth through [email or web address] and this was denied as the system could not find a matching pt.  I called [facility] and they could not locate the pt.  I called the pt and informed her of this and asked if there was a different insurance we could try for her and she stated her insurance ended at the end of the month and she paid cash for the Rx.

## 2017-07-31 ENCOUNTER — Ambulatory Visit (INDEPENDENT_AMBULATORY_CARE_PROVIDER_SITE_OTHER): Payer: Self-pay | Admitting: Internal Medicine

## 2017-07-31 ENCOUNTER — Encounter: Payer: Self-pay | Admitting: Internal Medicine

## 2017-07-31 VITALS — BP 120/80 | HR 99 | Temp 98.9°F | Ht 64.0 in | Wt 174.0 lb

## 2017-07-31 DIAGNOSIS — M797 Fibromyalgia: Secondary | ICD-10-CM

## 2017-07-31 DIAGNOSIS — M545 Low back pain, unspecified: Secondary | ICD-10-CM

## 2017-07-31 DIAGNOSIS — F1190 Opioid use, unspecified, uncomplicated: Secondary | ICD-10-CM

## 2017-07-31 DIAGNOSIS — G8929 Other chronic pain: Secondary | ICD-10-CM

## 2017-07-31 MED ORDER — HYDROCODONE-ACETAMINOPHEN 5-325 MG PO TABS: ORAL_TABLET | ORAL | 0 refills | Status: DC

## 2017-07-31 MED ORDER — HYDROCORTISONE 1 % EX CREA: 1.0000 "application " | TOPICAL_CREAM | Freq: Two times a day (BID) | CUTANEOUS | 0 refills | Status: DC

## 2017-07-31 NOTE — Progress Notes (Signed)
   Subjective:    Patient ID: [name], female    DOB: 1955/08/04, 62 y.o.   MRN: [id number]  HPI  62 year old patient seen today for pain management evaluation per opioid protocol  [place] database printed, initialed  and scanned into the EMR. New controlled drug contract reviewed and signed 10/18  Indication for chronic opioid: patient has a history of fibromyalgia and chronic low back pain.  She generally takes hydrocodone 3 times daily for neck, back, hip and leg pain.  She also takes a bedtime dose of Klonopin Medication and dose: hydrocodone 5-acetaminophen 325 3 times daily # pills per month:90 Last UDS date: 04/29/2017 ordered but not obtained.  Will repeat today Pain contract signed (Y/N): yes Date narcotic database last reviewed (include red flags): 07/31/17.  Patient also complains of a facial dermatitis for the past several days.  It is pruritic.  She has been using Neosporin.  She does have a history of acne in the past and a similar rash about 20 years ago.  She states she lost her job recently.  Has been caring for her mother who recently broke her hip and presently is in [place]   Review of Systems  Constitutional: Negative.   HENT: Negative for congestion, dental problem, hearing loss, rhinorrhea, sinus pressure, sore throat and tinnitus.   Eyes: Negative for pain, discharge and visual disturbance.  Respiratory: Negative for cough and shortness of breath.   Cardiovascular: Negative for chest pain, palpitations and leg swelling.  Gastrointestinal: Negative for abdominal distention, abdominal pain, blood in stool, constipation, diarrhea, nausea and vomiting.  Genitourinary: Negative for difficulty urinating, dysuria, flank pain, frequency, hematuria, pelvic pain, urgency, vaginal bleeding, vaginal discharge and vaginal pain.  Musculoskeletal: Negative for arthralgias, gait problem and joint swelling.  Skin: Positive for rash.  Neurological: Negative for  dizziness, syncope, speech difficulty, weakness, numbness and headaches.  Hematological: Negative for adenopathy.  Psychiatric/Behavioral: Negative for agitation, behavioral problems and dysphoric mood. The patient is not nervous/anxious.        Objective:   Physical Exam  Constitutional: She appears well-developed and well-nourished. No distress.  Skin: Rash noted.  Patchy erythematous scaly rash most marked involving the right facial area lateral to the mouth  Psychiatric: She has a normal mood and affect. Her behavior is normal. Judgment and thought content normal.          Assessment & Plan:   Encounter for chronic pain management (G89.29) Narcotic use  (711.90) Pain management contract signed (Z02.89)  Nonspecific facial dermatitis.  Neosporin will be discontinued the patient has been asked to use Selsun Blue 2-3 times per week.  Will treat with short-term 1% hydrocortisone.  Patient is aware to avoid chronic use and to not use for greater than 7 days  Rogelia BogaKWIATKOWSKI,Charly Holcomb FRANK

## 2017-07-31 NOTE — Patient Instructions (Signed)
Discontinue Neosporin Hydrocortisone 1% cream apply twice daily for 7 days.  Avoid chronic use of hydrocortisone;  use Selsun Blue 3 times per week until facial rash resolved

## 2017-08-04 LAB — PAIN MGMT, PROFILE 8 W/CONF, U
6 Acetylmorphine: NEGATIVE ng/mL (ref <10)
ALCOHOL METABOLITES: NEGATIVE ng/mL (ref <500)
ALPHAHYDROXYMIDAZOLAM: NEGATIVE ng/mL (ref <50)
AMPHETAMINES: NEGATIVE ng/mL (ref <500)
Alphahydroxyalprazolam: NEGATIVE ng/mL (ref <25)
Alphahydroxytriazolam: NEGATIVE ng/mL (ref <50)
Aminoclonazepam: 161 ng/mL — ABNORMAL HIGH (ref <25)
Benzodiazepines: POSITIVE ng/mL — AB (ref <100)
Buprenorphine, Urine: NEGATIVE ng/mL (ref <5)
COCAINE METABOLITE: NEGATIVE ng/mL (ref <150)
CREATININE: 177.5 mg/dL
Codeine: NEGATIVE ng/mL (ref <50)
Hydrocodone: 1097 ng/mL — ABNORMAL HIGH (ref <50)
Hydromorphone: 666 ng/mL — ABNORMAL HIGH (ref <50)
Hydroxyethylflurazepam: NEGATIVE ng/mL (ref <50)
LORAZEPAM: NEGATIVE ng/mL (ref <50)
MDMA: NEGATIVE ng/mL (ref <500)
MORPHINE: NEGATIVE ng/mL (ref <50)
Marijuana Metabolite: NEGATIVE ng/mL (ref <20)
NORDIAZEPAM: NEGATIVE ng/mL (ref <50)
Norhydrocodone: 2101 ng/mL — ABNORMAL HIGH (ref <50)
OPIATES: POSITIVE ng/mL — AB (ref <100)
OXIDANT: NEGATIVE ug/mL (ref <200)
Oxazepam: NEGATIVE ng/mL (ref <50)
Oxycodone: NEGATIVE ng/mL (ref <100)
PH: 6.7 (ref 4.5–9.0)
TEMAZEPAM: NEGATIVE ng/mL (ref <50)

## 2017-10-21 ENCOUNTER — Ambulatory Visit (INDEPENDENT_AMBULATORY_CARE_PROVIDER_SITE_OTHER): Payer: Self-pay | Admitting: Internal Medicine

## 2017-10-21 ENCOUNTER — Encounter: Payer: Self-pay | Admitting: *Deleted

## 2017-10-21 ENCOUNTER — Encounter: Payer: Self-pay | Admitting: Internal Medicine

## 2017-10-21 VITALS — BP 138/92 | HR 81 | Temp 98.6°F | Resp 16 | Wt 177.4 lb

## 2017-10-21 DIAGNOSIS — G8929 Other chronic pain: Secondary | ICD-10-CM

## 2017-10-21 DIAGNOSIS — M797 Fibromyalgia: Secondary | ICD-10-CM

## 2017-10-21 DIAGNOSIS — I10 Essential (primary) hypertension: Secondary | ICD-10-CM

## 2017-10-21 DIAGNOSIS — M545 Low back pain, unspecified: Secondary | ICD-10-CM

## 2017-10-21 MED ORDER — PROMETHAZINE HCL 25 MG PO TABS: 25.0000 mg | ORAL_TABLET | Freq: Four times a day (QID) | ORAL | 2 refills | Status: DC | PRN

## 2017-10-21 MED ORDER — HYDROCODONE-ACETAMINOPHEN 5-325 MG PO TABS: ORAL_TABLET | ORAL | 0 refills | Status: DC

## 2017-10-21 MED ORDER — VERAPAMIL HCL ER 240 MG PO TBCR: 240.0000 mg | EXTENDED_RELEASE_TABLET | Freq: Every day | ORAL | 2 refills | Status: DC

## 2017-10-21 MED ORDER — VENLAFAXINE HCL 37.5 MG PO TABS: 37.5000 mg | ORAL_TABLET | Freq: Two times a day (BID) | ORAL | 3 refills | Status: DC

## 2017-10-21 MED ORDER — POTASSIUM CHLORIDE CRYS ER 20 MEQ PO TBCR: 20.0000 meq | EXTENDED_RELEASE_TABLET | Freq: Two times a day (BID) | ORAL | 2 refills | Status: DC

## 2017-10-21 MED ORDER — ATORVASTATIN CALCIUM 40 MG PO TABS
40.0000 mg | ORAL_TABLET | Freq: Every day | ORAL | 2 refills | Status: DC
Start: 2017-10-21 — End: 2018-02-26

## 2017-10-21 NOTE — Patient Instructions (Signed)
Limit your sodium (Salt) intake  Please check your blood pressure on a regular basis.  If it is consistently greater than 140/90, please make an office appointment.  Return in 3 months for follow-up     

## 2017-10-21 NOTE — Progress Notes (Signed)
   Subjective:    Patient ID: [name], female    DOB: 1956-03-08, 62 y.o.   MRN: [id number]  HPI  62 year old patient who is seen today for follow-up and pain management per opioid protocol  Indication for chronic opioid: Patient has a history of chronic low back pain as well as fibromyalgia.  In addition to low back pain she also has neck and shoulder discomfort as well as occasional headaches. Medication and dose: Hydrocodone 5 mg 3 times daily # pills per month: 90 Last UDS date: January 2019 Opioid Treatment Agreement signed (Y/N):   New pain contract signed today Opioid Treatment Agreement last reviewed with patient:  Yes [place] reviewed this encounter (include red flags):  Yes patient has a history of essential hypertension.  She states her blood pressure at home yesterday evening was in a nice low normal range  Review of Systems     Objective:   Physical Exam        Assessment & Plan:   Essential hypertension.  No change in therapy.  Blood pressure bit high today.  Repeat blood pressure 140/90  Encounter for chronic pain management (G89.29) Narcotic use  (711.90) Pain management contract signed (Z02.89)  Rogelia BogaKWIATKOWSKI,PETER FRANK

## 2017-12-07 ENCOUNTER — Other Ambulatory Visit: Payer: Self-pay | Admitting: Internal Medicine

## 2018-02-23 ENCOUNTER — Encounter: Payer: Self-pay | Admitting: Internal Medicine

## 2018-02-26 ENCOUNTER — Ambulatory Visit (INDEPENDENT_AMBULATORY_CARE_PROVIDER_SITE_OTHER): Payer: Self-pay | Admitting: Internal Medicine

## 2018-02-26 ENCOUNTER — Encounter: Payer: Self-pay | Admitting: Internal Medicine

## 2018-02-26 VITALS — BP 100/64 | HR 86 | Temp 99.2°F | Wt 178.8 lb

## 2018-02-26 DIAGNOSIS — M797 Fibromyalgia: Secondary | ICD-10-CM

## 2018-02-26 DIAGNOSIS — M545 Low back pain: Secondary | ICD-10-CM

## 2018-02-26 DIAGNOSIS — I10 Essential (primary) hypertension: Secondary | ICD-10-CM

## 2018-02-26 DIAGNOSIS — G8929 Other chronic pain: Secondary | ICD-10-CM

## 2018-02-26 MED ORDER — VENLAFAXINE HCL 37.5 MG PO TABS: 37.5000 mg | ORAL_TABLET | Freq: Two times a day (BID) | ORAL | 3 refills | Status: DC

## 2018-02-26 MED ORDER — HYDROCODONE-ACETAMINOPHEN 5-325 MG PO TABS: ORAL_TABLET | ORAL | 0 refills | Status: DC

## 2018-02-26 MED ORDER — ATORVASTATIN CALCIUM 40 MG PO TABS: 40.0000 mg | ORAL_TABLET | Freq: Every day | ORAL | 2 refills | Status: DC

## 2018-02-26 MED ORDER — CLONAZEPAM 0.5 MG PO TABS: ORAL_TABLET | ORAL | 0 refills | Status: DC

## 2018-02-26 MED ORDER — VERAPAMIL HCL ER 240 MG PO TBCR: 240.0000 mg | EXTENDED_RELEASE_TABLET | Freq: Every day | ORAL | 2 refills | Status: DC

## 2018-02-26 NOTE — Progress Notes (Signed)
   Subjective:    Patient ID: [name], female    DOB: 03/28/1956, 62 y.o.   MRN: [id number]  HPI 62 year old patient who has hypertension and also history of chronic low back pain and fibromyalgia. She is seen today for pain management evaluation per opioid protocol  [place] database printed, initialed  and scanned into the EMR.  Indication for chronic opioid: She has chronic low back pain as well as fibromyalgia.  She also has a history of IBS and is complaining of some abdominal pain as well as headaches today.  She also is complaining of some knee pain. Medication and dose: Analgesic includes hydrocodone 5 1 every 6-8 hours as needed for pain # pills per month: Maximum 90/month Last UDS date: July 31, 2017 Opioid Treatment Agreement signed (Y/N): Yes Opioid Treatment Agreement last reviewed with patient:  July 31, 2017 [place] reviewed this encounter (include red flags):  Yes overdose risk score 150   Review of Systems     Objective:   Physical Exam  Constitutional: She appears well-developed and well-nourished. No distress.  Blood pressure 118/76  Vitals reviewed.         Assessment & Plan:   Hypertension.  Controlled medications are refilled  Encounter for chronic pain management (G89.29) Narcotic use  (711.90)  Gordy SaversPeter F Kwiatkowski

## 2018-02-26 NOTE — Patient Instructions (Signed)
Limit your sodium (Salt) intake  Please check your blood pressure on a regular basis.  If it is consistently greater than 150/90, please make an office appointment.  Return in 3 months for follow-up  BEST WISHES!!!!

## 2018-06-11 ENCOUNTER — Ambulatory Visit: Payer: 59 | Admitting: Family Medicine

## 2018-06-11 ENCOUNTER — Encounter: Payer: Self-pay | Admitting: Family Medicine

## 2018-06-11 ENCOUNTER — Other Ambulatory Visit: Payer: Self-pay

## 2018-06-11 VITALS — BP 138/88 | HR 80 | Temp 98.0°F | Ht 64.0 in | Wt 179.2 lb

## 2018-06-11 DIAGNOSIS — M797 Fibromyalgia: Secondary | ICD-10-CM

## 2018-06-11 DIAGNOSIS — J208 Acute bronchitis due to other specified organisms: Secondary | ICD-10-CM

## 2018-06-11 DIAGNOSIS — R05 Cough: Secondary | ICD-10-CM

## 2018-06-11 DIAGNOSIS — R059 Cough, unspecified: Secondary | ICD-10-CM

## 2018-06-11 LAB — POC INFLUENZA A&B (BINAX/QUICKVUE)
INFLUENZA A, POC: NEGATIVE
INFLUENZA B, POC: NEGATIVE

## 2018-06-11 MED ORDER — BENZONATATE 100 MG PO CAPS: 100.0000 mg | ORAL_CAPSULE | Freq: Two times a day (BID) | ORAL | 0 refills | Status: DC | PRN

## 2018-06-11 NOTE — Progress Notes (Signed)
Subjective  CC:  Chief Complaint  Patient presents with  . Headache    cough-non productive, fever at home, neck and shoulder pain    Same day acute visit; PCP not available. New pt to me. Chart reviewed.   HPI: SUBJECTIVE:  [name] is a 62 y.o. female who complains of mild congestion, nasal blockage, post nasal drip, cough described as hoarse, paroxysmal and causes headpain and denies sinus, high fevers, SOB, chest pain or significant GI symptoms. Symptoms have been present for 5 day. She has fibromyalgia on chronic narcotics. . She denies a history of anorexia, dizziness, vomiting and wheezing. She denies a history of asthma or COPD. Patient does not smoke cigarettes.  Assessment  1. Acute viral bronchitis   2. Cough   3. Fibromyalgia      Plan  Discussion:  Supportive care. Tessalon perles and otc cough meds, advil prn. Education regarding differences between viral and bacterial infections and treatment options are discussed.  Supportive care measures are recommended.  We discussed the use of mucolytic's, decongestants, antihistamines and antitussives as needed.  Tylenol or Advil are recommended if needed.  Follow up: prn   Orders Placed This Encounter  Procedures  . POC Influenza A&B(BINAX/QUICKVUE)   Meds ordered this encounter  Medications  . benzonatate (TESSALON) 100 MG capsule    Sig: Take 1 capsule (100 mg total) by mouth 2 (two) times daily as needed for cough.    Dispense:  20 capsule    Refill:  0      I reviewed the patients updated PMH, FH, and SocHx.  Social History: Patient  reports that she quit smoking about 10 years ago. She has never used smokeless tobacco. She reports that she does not drink alcohol or use drugs.  Patient Active Problem List   Diagnosis Date Noted  . Fibromyalgia 12/01/2012  . DEPRESSIVE DISORDER 05/21/2010  . Dyslipidemia 12/13/2008  . RENAL COLIC 02/12/2008  . 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 LOW BACK PAIN 12/02/2007    . DIARRHEA 11/13/2007  . Essential hypertension 04/28/2007  . UTI'S, RECURRENT 04/28/2007  . 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    Objective  Vitals: BP 138/88   Pulse 80   Temp 98 F (36.7 C)   Ht 5\' 4"  (1.626 m)   Wt 179 lb 3.2 oz (81.3 kg)   SpO2 98%   BMI 30.76 kg/m  General: no acute distress , no coughing  Psych:  Alert and oriented, normal mood and affect HEENT:  Normocephalic, atraumatic, supple neck, moist mucous membranes, mildly erythematous pharynx without exudate, mild lymphadenopathy, supple neck Cardiovascular:  RRR without murmur. no edema Respiratory:  Good breath sounds bilaterally, CTAB with normal respiratory effort with occasional rhonchi Skin:  Warm, no rashes Neurologic:   Mental status is normal. normal gait  Office Visit on 06/11/2018  Component Date Value Ref Range Status  . Influenza A, POC 06/11/2018 Negative  Negative Final  . Influenza B, POC 06/11/2018 Negative  Negative Final    Commons side effects, risks, benefits, and alternatives for medications and treatment plan prescribed today were discussed, and the patient expressed understanding of the given instructions. Patient is instructed to call or message via MyChart if  he/she has any questions or concerns regarding our treatment plan. No barriers to understanding were identified. We discussed Red Flag symptoms and signs in detail. Patient expressed understanding regarding what to do in case of urgent or emergency type symptoms.  Medication list was reconciled, printed and provided to the patient in AVS. Patient instructions and summary information was reviewed with the  patient as documented in the AVS. This note was prepared with assistance of Dragon voice recognition software. Occasional wrong-word or sound-a-like substitutions may have occurred due to the inherent limitations of voice recognition software

## 2018-06-11 NOTE — Patient Instructions (Signed)
Please follow up if symptoms do not improve or as needed.   You may use Delsym cough syrup or Mucinex DM to help with congestion and coughing. Add advil for headache and sore throat.  Use a humidifier.    Acute Bronchitis, Adult Acute bronchitis is sudden (acute) swelling of the air tubes (bronchi) in the lungs. Acute bronchitis causes these tubes to fill with mucus, which can make it hard to breathe. It can also cause coughing or wheezing. In adults, acute bronchitis usually goes away within 2 weeks. A cough caused by bronchitis may last up to 3 weeks. Smoking, allergies, and asthma can make the condition worse. Repeated episodes of bronchitis may cause further lung problems, such as chronic obstructive pulmonary disease (COPD). What are the causes? This condition can be caused by germs and by substances that irritate the lungs, including:  Cold and flu viruses. This condition is most often caused by the same virus that causes a cold.  Bacteria.  Exposure to tobacco smoke, dust, fumes, and air pollution.  What increases the risk? This condition is more likely to develop in people who:  Have close contact with someone with acute bronchitis.  Are exposed to lung irritants, such as tobacco smoke, dust, fumes, and vapors.  Have a weak immune system.  Have a respiratory condition such as asthma.  What are the signs or symptoms? Symptoms of this condition include:  A cough.  Coughing up clear, yellow, or green mucus.  Wheezing.  Chest congestion.  Shortness of breath.  A fever.  Body aches.  Chills.  A sore throat.  How is this diagnosed? This condition is usually diagnosed with a physical exam. During the exam, your health care provider may order tests, such as chest X-rays, to rule out other conditions. He or she may also:  Test a sample of your mucus for bacterial infection.  Check the level of oxygen in your blood. This is done to check for pneumonia.  Do a  chest X-ray or lung function testing to rule out pneumonia and other conditions.  Perform blood tests.  Your health care provider will also ask about your symptoms and medical history. How is this treated? Most cases of acute bronchitis clear up over time without treatment. Your health care provider may recommend:  Drinking more fluids. Drinking more makes your mucus thinner, which may make it easier to breathe.  Taking a medicine for a fever or cough.  Taking an antibiotic medicine.  Using an inhaler to help improve shortness of breath and to control a cough.  Using a cool mist vaporizer or humidifier to make it easier to breathe.  Follow these instructions at home: Medicines  Take over-the-counter and prescription medicines only as told by your health care provider.  If you were prescribed an antibiotic, take it as told by your health care provider. Do not stop taking the antibiotic even if you start to feel better. General instructions  Get plenty of rest.  Drink enough fluids to keep your urine clear or pale yellow.  Avoid smoking and secondhand smoke. Exposure to cigarette smoke or irritating chemicals will make bronchitis worse. If you smoke and you need help quitting, ask your health care provider. Quitting smoking will help your lungs heal faster.  Use an inhaler, cool mist vaporizer, or humidifier as told by your health care provider.  Keep all follow-up visits as told by your health care provider. This is important. How is this prevented? To lower your risk  of getting this condition again:  Wash your hands often with soap and water. If soap and water are not available, use hand sanitizer.  Avoid contact with people who have cold symptoms.  Try not to touch your hands to your mouth, nose, or eyes.  Make sure to get the flu shot every year.  Contact a health care provider if:  Your symptoms do not improve in 2 weeks of treatment. Get help right away if:  You  cough up blood.  You have chest pain.  You have severe shortness of breath.  You become dehydrated.  You faint or keep feeling like you are going to faint.  You keep vomiting.  You have a severe headache.  Your fever or chills gets worse. This information is not intended to replace advice given to you by your health care provider. Make sure you discuss any questions you have with your health care provider. Document Released: 08/01/2004 Document Revised: 01/17/2016 Document Reviewed: 12/13/2015 Elsevier Interactive Patient Education  [employer].

## 2018-06-19 ENCOUNTER — Encounter: Payer: Self-pay | Admitting: Family Medicine

## 2018-06-19 ENCOUNTER — Ambulatory Visit: Payer: 59 | Admitting: Family Medicine

## 2018-06-19 ENCOUNTER — Other Ambulatory Visit: Payer: Self-pay

## 2018-06-19 VITALS — BP 138/86 | HR 79 | Temp 98.3°F | Ht 64.0 in | Wt 179.6 lb

## 2018-06-19 DIAGNOSIS — F339 Major depressive disorder, recurrent, unspecified: Secondary | ICD-10-CM

## 2018-06-19 DIAGNOSIS — K52831 Collagenous colitis: Secondary | ICD-10-CM | POA: Diagnosis not present

## 2018-06-19 DIAGNOSIS — N200 Calculus of kidney: Secondary | ICD-10-CM

## 2018-06-19 DIAGNOSIS — G43009 Migraine without aura, not intractable, without status migrainosus: Secondary | ICD-10-CM

## 2018-06-19 DIAGNOSIS — G894 Chronic pain syndrome: Secondary | ICD-10-CM | POA: Diagnosis not present

## 2018-06-19 DIAGNOSIS — Z1239 Encounter for other screening for malignant neoplasm of breast: Secondary | ICD-10-CM

## 2018-06-19 DIAGNOSIS — Z87440 Personal history of urinary (tract) infections: Secondary | ICD-10-CM

## 2018-06-19 DIAGNOSIS — F1190 Opioid use, unspecified, uncomplicated: Secondary | ICD-10-CM | POA: Diagnosis not present

## 2018-06-19 DIAGNOSIS — E785 Hyperlipidemia, unspecified: Secondary | ICD-10-CM

## 2018-06-19 DIAGNOSIS — Z23 Encounter for immunization: Secondary | ICD-10-CM

## 2018-06-19 DIAGNOSIS — M797 Fibromyalgia: Secondary | ICD-10-CM

## 2018-06-19 DIAGNOSIS — I10 Essential (primary) hypertension: Secondary | ICD-10-CM

## 2018-06-19 DIAGNOSIS — G250 Essential tremor: Secondary | ICD-10-CM

## 2018-06-19 DIAGNOSIS — E876 Hypokalemia: Secondary | ICD-10-CM

## 2018-06-19 HISTORY — DX: Migraine without aura, not intractable, without status migrainosus: G43.009

## 2018-06-19 NOTE — Progress Notes (Signed)
Subjective  CC:  Chief Complaint  Patient presents with  . Establish Care    Tranfer Care from Dr. Davy Pique at [facility], no complaints, due for mammogram  . Headache    when bending over or turning over since July.     HPI: [name] is a 62 y.o. female who presents to [facility] today to establish care with me as a new patient.  I saw her last week for acute bronchitis.  She reports she is feeling much better.  I reviewed multiple records from her prior primary care doctor. In brief, she is a pleasant 62 year old female, mother of 2 biologic daughters and 1 stepson, all grown out of the home; married to a disabled veteran.  Works full-time.  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  Has history of currently well controlled fibromyalgia with chronic headaches neck pain and back pain managed by low-dose hydrocodone 1-3 times daily.  This was managed by her primary care doctor although it had been diagnosed by Dr. Corliss Skains in the past.  She has failed multiple other fibromyalgia medications. Has history of well-controlled hypertension and hyperlipidemia.  She is overdue for lab work and a complete physical. She sees gastroenterology to manage her IBS and history of collagenous colitis.  She admits to loose stools daily even on Imodium.  Of note she is on chronic potassium supplementation although she is not sure why.  Could be from GI losses. Also with history of migraines although these are better controlled now that she is older. Health maintenance: Due for mammogram and flu vaccination.  She has had a colonoscopy and will be due for an upcoming repeat soon.  Due for physical exam with fasting lab work She has the following concerns or needs:  Complains of 4 to 2-month history of fleeting sharp headaches that occur with bending over, sometimes with coughing but not sneezing.  They are not associated with  migrainous features.  No vision changes or speech changes.  She does have chronic history of neck pain and occipital headaches.  She does not take anything for headaches because they resolve spontaneously.  Assessment  1. Fibromyalgia   2. Chronic narcotic use   3. Chronic pain syndrome   4. Collagenous colitis   5. Hypokalemia   6. Migraine without aura and without status migrainosus, not intractable   7. Essential tremor   8. Breast cancer screening   9. Need for immunization against influenza      Plan   Fibromyalgia and chronic narcotic management program: Urine drug screen and narcotic contract is up-to-date.  Will be renewed in January.  She currently has medication to use.  Well controlled  History of hypokalemia: We will get lab work in January.  May warrant a further evaluation to rule out hypoaldosteronism or other cause.  She will follow-up with GI for colon cancer screening and colitis management  Migraines are currently controlled.  Will follow  Health maintenance ordered mammogram.  Updated influenza shot today.  Follow up:  Return in about 4 weeks (around 07/17/2018) for complete physical. Orders Placed This Encounter  Procedures  . MM DIGITAL SCREENING BILATERAL  . Flu Vaccine QUAD 36+ mos IM   No orders of the defined types were placed in this encounter.    Depression screen [facility] 2/9 06/19/2018 12/04/2016 11/28/2015 11/22/2014  Decreased Interest 0 0 0 0  Down, Depressed, Hopeless 0 0 1 0  PHQ -  2 Score 0 0 1 0  Altered sleeping 0 - - -  Tired, decreased energy 0 - - -  Change in appetite 0 - - -  Feeling bad or failure about yourself  0 - - -  Trouble concentrating 0 - - -  Moving slowly or fidgety/restless 0 - - -  Suicidal thoughts 0 - - -  PHQ-9 Score 0 - - -  Difficult doing work/chores Not difficult at all - - -    We updated and reviewed the patient's past history in detail and it is documented below.  Patient Active Problem List   Diagnosis Date  Noted  . Chronic narcotic use 06/19/2018  . Hypokalemia 06/19/2018    ? GI loss; hasn't had work up. On supplement   . Migraine without aura and without status migrainosus, not intractable 06/19/2018  . Fibromyalgia 12/01/2012    Has failed gabapentin, lyrica, tramadol. Has seen Dr. Corliss Skains in the past.    . Major depression, recurrent, chronic (HCC) 05/21/2010    Qualifier: Diagnosis of  By: Milinda Cave M.D., Elizebeth Brooking    . Dyslipidemia 12/13/2008    Qualifier: Diagnosis of  By: Amador Cunas  MD, Janett Labella    . Collagenous colitis 01/01/2008    Dr. Leone Payor    . Chronic pain 12/02/2007    Dr. Vela Prose for chronic pain Head, neck, and back   . Essential hypertension 04/28/2007    Qualifier: Diagnosis of  By: Everett Graff     . History of recurrent UTI (urinary tract infection) 04/28/2007    Qualifier: Diagnosis of  By: Everett Graff     . Nephrolithiasis 04/28/2007    Qualifier: Diagnosis of  By: Tawanna Cooler RN, [facility] Maintenance  Topic Date Due  . Hepatitis C Screening  Nov 10, 1955  . HIV Screening  11/24/1972  . MAMMOGRAM  11/25/1975  . COLONOSCOPY  12/13/2017  . INFLUENZA VACCINE  02/05/2018  . TETANUS/TDAP  12/14/2018  . PAP SMEAR-Modifier  Discontinued   Immunization History  Administered Date(s) Administered  . Influenza Split 05/27/2012  . Influenza Whole 03/15/2010  . Influenza, High Dose Seasonal PF 04/29/2017  . Influenza,inj,Quad PF,6+ Mos 06/02/2013, 05/24/2014, 05/09/2015, 05/28/2016, 06/19/2018  . Td 12/13/2008   Current Meds  Medication Sig  . aspirin 81 MG tablet Take 81 mg by mouth daily.    Marland Kitchen atorvastatin (LIPITOR) 40 MG tablet Take 1 tablet (40 mg total) by mouth daily.  . Cholecalciferol (VITAMIN D) 1000 UNITS capsule Take 1,000 Units by mouth daily.    . clonazePAM (KLONOPIN PO) Take by mouth.  Marland Kitchen HYDROcodone-acetaminophen (NORCO/VICODIN) 5-325 MG tablet TAKE ONE TABLET BY MOUTH EVERY 6 HOURS AS NEEDED FOR PAIN  .  hydrocortisone cream 1 % Apply 1 application topically 2 (two) times daily.  Marland Kitchen loperamide (IMODIUM) 2 MG capsule Take 1 capsule (2 mg total) by mouth 4 (four) times daily as needed.  . potassium chloride SA (K-DUR,KLOR-CON) 20 MEQ tablet Take 1 tablet (20 mEq total) by mouth 2 (two) times daily.  . promethazine (PHENERGAN) 25 MG tablet Take 1 tablet (25 mg total) by mouth every 6 (six) hours as needed. for nausea  . triamcinolone cream (KENALOG) 0.1 % Apply 1 application topically 3 (three) times daily.  Marland Kitchen venlafaxine (EFFEXOR) 37.5 MG tablet Take 1 tablet (37.5 mg total) by mouth 2 (two) times daily.  . verapamil (CALAN-SR) 240 MG CR tablet Take 1 tablet (240 mg total) by mouth at bedtime.  Allergies: Patient is allergic to sulfonamide derivatives; codeine sulfate; diphenhydramine hcl; moxifloxacin; tramadol hcl; and penicillins. Past Medical History Patient  has a past medical history of Bladder stones, Bursitis of shoulder, COLLAGENOUS COLITIS (01/01/2008), DEPRESSIVE DISORDER (05/21/2010), Fibromyalgia, HYPERLIPIDEMIA (12/13/2008), HYPERTENSION (04/28/2007), LOW BACK PAIN (12/02/2007), Migraine, Migraine without aura and without status migrainosus, not intractable (06/19/2018), NEPHROLITHIASIS, HX OF (04/28/2007), Raynaud disease, Renal colic (02/12/2008), and UTI'S, RECURRENT (04/28/2007). Past Surgical History Patient  has a past surgical history that includes Abdominal hysterectomy (1990). Family History: Patient family history includes Alcohol abuse in her father; Arrhythmia in her daughter; Healthy in her sister; Hyperlipidemia in her brother, brother, daughter, and mother; Hypertension in her brother, brother, and mother; Parkinson's disease in her mother; Polycystic ovary syndrome in her daughter. Social History:  Patient  reports that she quit smoking about 10 years ago. Her smoking use included cigarettes. She smoked 1.00 pack per day. She has never used smokeless tobacco. She reports that  she does not drink alcohol or use drugs.  Review of Systems: Constitutional: negative for fever or malaise Ophthalmic: negative for photophobia, double vision or loss of vision Cardiovascular: negative for chest pain, dyspnea on exertion, or new LE swelling Respiratory: negative for SOB or persistent cough Gastrointestinal: negative for abdominal pain, change in bowel habits or melena Genitourinary: negative for dysuria or gross hematuria Musculoskeletal: negative for new gait disturbance or muscular weakness Integumentary: negative for new or persistent rashes Neurological: negative for TIA or stroke symptoms Psychiatric: negative for SI or delusions Allergic/Immunologic: negative for hives  Patient Care Team    Relationship Specialty Notifications Start End  Willow OraAndy, Camille L, MD PCP - General Family Medicine  06/19/18   Iva BoopGessner, Carl E, MD Consulting Physician Gastroenterology  06/19/18     Objective  Vitals: BP 138/86   Pulse 79   Temp 98.3 F (36.8 C)   Ht 5\' 4"  (1.626 m)   Wt 179 lb 9.6 oz (81.5 kg)   BMI 30.83 kg/m  General:  Well developed, well nourished, no acute distress  Psych:  Alert and oriented,normal mood and affect, appears happy HEENT:  Normocephalic, atraumatic, non-icteric sclera, PERRL, oropharynx is without mass or exudate, supple neck without adenopathy, mass or thyromegaly Cardiovascular:  RRR without gallop, rub or murmur, nondisplaced PMI Respiratory:  Good breath sounds bilaterally, CTAB with normal respiratory effort Gastrointestinal: normal bowel sounds, soft, non-tender, no noted masses. No HSM MSK: no deformities, contusions. Joints are without erythema or swelling Skin:  Warm, no rashes or suspicious lesions noted Neurologic:    Mental status is normal. Gross motor and sensory exams are normal. Normal gait   Commons side effects, risks, benefits, and alternatives for medications and treatment plan prescribed today were discussed, and the patient  expressed understanding of the given instructions. Patient is instructed to call or message via MyChart if he/she has any questions or concerns regarding our treatment plan. No barriers to understanding were identified. We discussed Red Flag symptoms and signs in detail. Patient expressed understanding regarding what to do in case of urgent or emergency type symptoms.   Medication list was reconciled, printed and provided to the patient in AVS. Patient instructions and summary information was reviewed with the patient as documented in the AVS. This note was prepared with assistance of Dragon voice recognition software. Occasional wrong-word or sound-a-like substitutions may have occurred due to the inherent limitations of voice recognition software

## 2018-06-19 NOTE — Patient Instructions (Addendum)
Please return in 1-3 months for your annual complete physical; please come fasting.  Today you were given your influenza vaccination.   We will call you with information regarding your referral appointment. Mammogram. If you do not hear from us within the next 2 weeks, please let me know. It can take 1-2 weeks to get appointments set up with the specialists.   It was a pleasure seeing you again today! Thank you for choosing us to meet your healthcare needs! I truly look forward to working with you. If you have any questions or concerns, please send me a message via Mychart or call the office at [phone].

## 2018-06-22 ENCOUNTER — Other Ambulatory Visit: Payer: Self-pay | Admitting: Family Medicine

## 2018-06-22 NOTE — Telephone Encounter (Signed)
Please advise 

## 2018-06-22 NOTE — Telephone Encounter (Signed)
Copied from CRM [id number]. Topic: Quick Communication - See Telephone Encounter >> Jun 22, 2018  4:46 PM Jens SomMedley, Jennifer A wrote: CRM for notification. See Telephone encounter for: 06/22/18.  Patient is calling to report that she is taking clonazePAM (KLONOPIN PO) [[id number]][[id number]] 0.5mg  Patient is is requesting a refill Patient is [facility] a [facility] [address] - [place], [place] - [address] [place] [address] Phone: [phone] Fax: [phone] Not a 24 hour pharmacy; exact hours not known Sent per Nurse Triage

## 2018-06-23 NOTE — Telephone Encounter (Signed)
Last OV: 06/19/2018 Last Fill: historical provider

## 2018-06-24 MED ORDER — CLONAZEPAM 0.5 MG PO TABS: 0.5000 mg | ORAL_TABLET | Freq: Every day | ORAL | 5 refills | Status: DC

## 2018-07-22 ENCOUNTER — Other Ambulatory Visit: Payer: Self-pay

## 2018-07-22 ENCOUNTER — Other Ambulatory Visit: Payer: Self-pay | Admitting: Internal Medicine

## 2018-07-22 ENCOUNTER — Encounter: Payer: Self-pay | Admitting: Family Medicine

## 2018-07-22 ENCOUNTER — Ambulatory Visit (INDEPENDENT_AMBULATORY_CARE_PROVIDER_SITE_OTHER): Payer: 59 | Admitting: Family Medicine

## 2018-07-22 VITALS — BP 126/78 | HR 78 | Temp 98.6°F | Resp 16 | Ht 64.0 in | Wt 181.6 lb

## 2018-07-22 DIAGNOSIS — I10 Essential (primary) hypertension: Secondary | ICD-10-CM | POA: Diagnosis not present

## 2018-07-22 DIAGNOSIS — Z0000 Encounter for general adult medical examination without abnormal findings: Secondary | ICD-10-CM | POA: Diagnosis not present

## 2018-07-22 DIAGNOSIS — G894 Chronic pain syndrome: Secondary | ICD-10-CM

## 2018-07-22 DIAGNOSIS — F1190 Opioid use, unspecified, uncomplicated: Secondary | ICD-10-CM

## 2018-07-22 DIAGNOSIS — F339 Major depressive disorder, recurrent, unspecified: Secondary | ICD-10-CM

## 2018-07-22 DIAGNOSIS — M797 Fibromyalgia: Secondary | ICD-10-CM

## 2018-07-22 DIAGNOSIS — R519 Headache, unspecified: Secondary | ICD-10-CM

## 2018-07-22 DIAGNOSIS — R51 Headache: Secondary | ICD-10-CM

## 2018-07-22 DIAGNOSIS — E785 Hyperlipidemia, unspecified: Secondary | ICD-10-CM

## 2018-07-22 DIAGNOSIS — E876 Hypokalemia: Secondary | ICD-10-CM | POA: Diagnosis not present

## 2018-07-22 LAB — CBC WITH DIFFERENTIAL/PLATELET
Basophils Absolute: 0.1 10*3/uL (ref 0.0–0.1)
Basophils Relative: 1.1 % (ref 0.0–3.0)
Eosinophils Absolute: 0.2 10*3/uL (ref 0.0–0.7)
Eosinophils Relative: 2.6 % (ref 0.0–5.0)
HCT: 39.8 % (ref 36.0–46.0)
Hemoglobin: 13.3 g/dL (ref 12.0–15.0)
Lymphocytes Relative: 39.9 % (ref 12.0–46.0)
Lymphs Abs: 3.1 10*3/uL (ref 0.7–4.0)
MCHC: 33.4 g/dL (ref 30.0–36.0)
MCV: 90.7 fl (ref 78.0–100.0)
MONO ABS: 0.4 10*3/uL (ref 0.1–1.0)
Monocytes Relative: 5.2 % (ref 3.0–12.0)
Neutro Abs: 4 10*3/uL (ref 1.4–7.7)
Neutrophils Relative %: 51.2 % (ref 43.0–77.0)
Platelets: 295 10*3/uL (ref 150.0–400.0)
RBC: 4.39 Mil/uL (ref 3.87–5.11)
RDW: 13.4 % (ref 11.5–15.5)
WBC: 7.7 10*3/uL (ref 4.0–10.5)

## 2018-07-22 LAB — COMPREHENSIVE METABOLIC PANEL
ALT: 19 U/L (ref 0–35)
AST: 17 U/L (ref 0–37)
Albumin: 4.4 g/dL (ref 3.5–5.2)
Alkaline Phosphatase: 76 U/L (ref 39–117)
BUN: 14 mg/dL (ref 6–23)
CHLORIDE: 103 meq/L (ref 96–112)
CO2: 27 mEq/L (ref 19–32)
Calcium: 9.3 mg/dL (ref 8.4–10.5)
Creatinine, Ser: 1.13 mg/dL (ref 0.40–1.20)
GFR: 52.09 mL/min — ABNORMAL LOW (ref >60.00)
Glucose, Bld: 87 mg/dL (ref 70–99)
Potassium: 4.2 mEq/L (ref 3.5–5.1)
Sodium: 139 mEq/L (ref 135–145)
Total Bilirubin: 0.4 mg/dL (ref 0.2–1.2)
Total Protein: 7.1 g/dL (ref 6.0–8.3)

## 2018-07-22 LAB — LIPID PANEL
Cholesterol: 195 mg/dL (ref 0–200)
HDL: 66.4 mg/dL (ref >39.00)
LDL Cholesterol: 95 mg/dL (ref 0–99)
NonHDL: 128.2
Total CHOL/HDL Ratio: 3
Triglycerides: 166 mg/dL — ABNORMAL HIGH (ref 0.0–149.0)
VLDL: 33.2 mg/dL (ref 0.0–40.0)

## 2018-07-22 LAB — MAGNESIUM: Magnesium: 1.7 mg/dL (ref 1.5–2.5)

## 2018-07-22 LAB — TSH: TSH: 1.19 u[IU]/mL (ref 0.35–4.50)

## 2018-07-22 MED ORDER — HYDROCODONE-ACETAMINOPHEN 5-325 MG PO TABS: 1.0000 | ORAL_TABLET | Freq: Three times a day (TID) | ORAL | 0 refills | Status: DC | PRN

## 2018-07-22 NOTE — Patient Instructions (Addendum)
Please return in 3 months for pain management recheck.  I will release your lab results to you on your MyChart account with further instructions. Please reply with any questions.   Call and make an appointment for your mammogram at the [facility]: [phone] Address: [address], [place], [place] [address]  Please also contact your GI doctor to get set up for your colonoscopy that is now due.   I am referring your to a headache specialist to evaluate your new atypical headaches.   If you have any questions or concerns, please don't hesitate to send me a message via MyChart or call the office at [phone]. Thank you for visiting with [place] today! It's our pleasure caring for you.  Please do these things to maintain good health!   Exercise at least 30-45 minutes a day,  4-5 days a week.   Eat a low-fat diet with lots of fruits and vegetables, up to 7-9 servings per day.  Drink plenty of water daily. Try to drink 8 8oz glasses per day.  Seatbelts can save your life. Always wear your seatbelt.  Place Smoke Detectors on every level of your home and check batteries every year.  Schedule an appointment with an eye doctor for an eye exam every 1-2 years  Safe sex - use condoms to protect yourself from STDs if you could be exposed to these types of infections. Use birth control if you do not want to become pregnant and are sexually active.  Avoid heavy alcohol use. If you drink, keep it to less than 2 drinks/day and not every day.  Health Care Power of Attorney.  Choose someone you trust that could speak for you if you became unable to speak for yourself.  Depression is common in our stressful world.If you're feeling down or losing interest in things you normally enjoy, please come in for a visit.  If anyone is threatening or hurting you, please get help. Physical or Emotional Violence is never OK.

## 2018-07-22 NOTE — Progress Notes (Signed)
Subjective  Chief Complaint  Patient presents with  . Annual Exam    Fasting  . Pain    chornic and fibro f/u  . Hypertension  . Hyperlipidemia    HPI: [name] is a 63 y.o. female who presents to [facility] today for a Female Wellness Visit. She also has the concerns and/or needs as listed above in the chief complaint. These will be addressed in addition to the Health Maintenance Visit.   Wellness Visit: annual visit with health maintenance review and exam without Pap   HM: mammo due. Pt to call to schedule. Pap non needed. imms are up to date. She is a shingrix candidate.  Chronic disease f/u and/or acute problem visit: (deemed necessary to be done in addition to the wellness visit):  Fibro and chronic pain with chronic narcotics: managed fairly well. Due for refills. uds and pain contract today.   Headaches: mentioned at last visit: pain and pressure that lasts minutes to 10-15 minutes and occurs mostly at night with movement. No associated neuro sxs. Different from migraines and different from "chronic pain". Now feeling anxious at night due to pain. No vision changes, diplopia or paresis. No associated nausea or vomiting.  Has not taken any medications for pain because it is typically fleeting.  No dizziness.  No hearing loss.  No balance problems. HTN: Feeling well. Taking medications w/o adverse effects. No symptoms of CHF, angina; no palpitations, sob, cp or lower extremity edema. Compliant with meds.  HLD: fasting for lab work today.  Continues on a statin without adverse effects. Chronic depression is well controlled on current medications. History of hyperkalemia thought to be due to chronic diarrhea.  Check magnesium and levels today.  Assessment  1. Annual physical exam   2. Essential hypertension   3. Dyslipidemia   4. Fibromyalgia   5. Hypokalemia   6. Chronic pain syndrome   7. Chronic narcotic use      Plan  Female  Wellness Visit:  Age appropriate Health Maintenance and Prevention measures were discussed with patient. Included topics are cancer screening recommendations, ways to keep healthy (see AVS) including dietary and exercise recommendations, regular eye and dental care, use of seat belts, and avoidance of moderate alcohol use and tobacco use.  Patient to call to schedule her mammogram and colonoscopy  BMI: discussed patient's BMI and encouraged positive lifestyle modifications to help get to or maintain a target BMI.  HM needs and immunizations were addressed and ordered. See below for orders. See HM and immunization section for updates.  Consider Shingrix at next visit  Routine labs and screening tests ordered including cmp, cbc and lipids where appropriate.  Discussed recommendations regarding Vit D and calcium supplementation (see AVS)  Chronic disease management visit and/or acute problem visit:  Hypertension is well controlled.  Continue current medications.  Recheck renal function and electrolytes  Hyperlipidemia, fasting for recheck today.  On statin.  Check LFTs.  Chronic depression is well controlled.  Fibromyalgia and chronic pain syndrome on narcotics.  Uses stable.  Refilled for 3 months.  Recheck in 3 months.  If pain becomes uncontrolled, will refer to pain management.  Patient understands and agrees with care plan.  Atypical headache.  Unclear etiology.  Could be anxiety related but given progressive symptoms, refer to headache clinic for further evaluation.  Red flags discussed.  None identified today.  Hypokalemia: Recheck along with magnesium levels.  Continue supplementation for now. Follow up: Return  in about 3 months (around 10/21/2018) for pain management f/u.  Orders Placed This Encounter  Procedures  . CBC with Differential/Platelet  . Comprehensive metabolic panel  . Lipid panel  . HIV Antibody (routine testing w rflx)  . TSH  . Magnesium  . Pain Mgmt, Profile 8  w/Conf, U  . Hepatitis C antibody   No orders of the defined types were placed in this encounter.     Lifestyle: Body mass index is 31.17 kg/m. Wt Readings from Last 3 Encounters:  07/22/18 181 lb 9.6 oz (82.4 kg)  06/19/18 179 lb 9.6 oz (81.5 kg)  06/11/18 179 lb 3.2 oz (81.3 kg)     Patient Active Problem List   Diagnosis Date Noted  . Chronic narcotic use 06/19/2018  . Hypokalemia 06/19/2018    ? GI loss; hasn't had work up. On supplement   . Migraine without aura and without status migrainosus, not intractable 06/19/2018  . Fibromyalgia 12/01/2012    Has failed gabapentin, lyrica, tramadol. Has seen Dr. Corliss Skains in the past.    . Major depression, recurrent, chronic (HCC) 05/21/2010  . Dyslipidemia 12/13/2008  . Collagenous colitis 01/01/2008    Dr. Leone Payor    . Chronic pain 12/02/2007    Dr. Vela Prose for chronic pain Head, neck, and back   . Essential hypertension 04/28/2007  . History of recurrent UTI (urinary tract infection) 04/28/2007  . Nephrolithiasis 04/28/2007   Health Maintenance  Topic Date Due  . Hepatitis C Screening  1956/01/15  . HIV Screening  11/24/1972  . MAMMOGRAM  11/25/1975  . COLONOSCOPY  12/13/2017  . TETANUS/TDAP  12/14/2018  . INFLUENZA VACCINE  Completed  . PAP SMEAR-Modifier  Discontinued   Immunization History  Administered Date(s) Administered  . Influenza Split 05/27/2012  . Influenza Whole 03/15/2010  . Influenza, High Dose Seasonal PF 04/29/2017  . Influenza,inj,Quad PF,6+ Mos 06/02/2013, 05/24/2014, 05/09/2015, 05/28/2016, 06/19/2018  . Td 12/13/2008   We updated and reviewed the patient's past history in detail and it is documented below. Allergies: Patient is allergic to sulfonamide derivatives; codeine sulfate; diphenhydramine hcl; moxifloxacin; tramadol hcl; and penicillins. Past Medical History Patient  has a past medical history of Bladder stones, Bursitis of shoulder, COLLAGENOUS COLITIS (01/01/2008), DEPRESSIVE  DISORDER (05/21/2010), Fibromyalgia, HYPERLIPIDEMIA (12/13/2008), HYPERTENSION (04/28/2007), LOW BACK PAIN (12/02/2007), Migraine, Migraine without aura and without status migrainosus, not intractable (06/19/2018), NEPHROLITHIASIS, HX OF (04/28/2007), Raynaud disease, Renal colic (02/12/2008), and UTI'S, RECURRENT (04/28/2007). Past Surgical History Patient  has a past surgical history that includes Abdominal hysterectomy (1990). Family History: Patient family history includes Alcohol abuse in her father; Arrhythmia in her daughter; Healthy in her sister; Hyperlipidemia in her brother, brother, daughter, and mother; Hypertension in her brother, brother, and mother; Parkinson's disease in her mother; Polycystic ovary syndrome in her daughter. Social History:  Patient  reports that she quit smoking about 11 years ago. Her smoking use included cigarettes. She smoked 1.00 pack per day. She has never used smokeless tobacco. She reports that she does not drink alcohol or use drugs.  Review of Systems: Constitutional: negative for fever or malaise Ophthalmic: negative for photophobia, double vision or loss of vision Cardiovascular: negative for chest pain, dyspnea on exertion, or new LE swelling Respiratory: negative for SOB or persistent cough Gastrointestinal: negative for abdominal pain, change in bowel habits or melena Genitourinary: negative for dysuria or gross hematuria, no abnormal uterine bleeding or disharge Musculoskeletal: negative for new gait disturbance or muscular weakness Integumentary: negative for new or persistent  rashes, no breast lumps Neurological: negative for TIA or stroke symptoms Psychiatric: negative for SI or delusions Allergic/Immunologic: negative for hives  Patient Care Team    Relationship Specialty Notifications Start End  Willow OraAndy, Camille L, MD PCP - General Family Medicine  06/19/18   Iva BoopGessner, Carl E, MD Consulting Physician Gastroenterology  06/19/18     Objective    Vitals: BP 126/78   Pulse 78   Temp 98.6 F (37 C) (Oral)   Resp 16   Ht 5\' 4"  (1.626 m)   Wt 181 lb 9.6 oz (82.4 kg)   SpO2 97%   BMI 31.17 kg/m  General:  Well developed, well nourished, no acute distress  Psych:  Alert and orientedx3,normal mood and affect HEENT:  Normocephalic, atraumatic, non-icteric sclera, PERRL, oropharynx is clear without mass or exudate, supple neck without adenopathy, mass or thyromegaly Cardiovascular:  Normal S1, S2, RRR without gallop, rub or murmur, nondisplaced PMI Respiratory:  Good breath sounds bilaterally, CTAB with normal respiratory effort Gastrointestinal: normal bowel sounds, soft, non-tender, no noted masses. No HSM MSK: no deformities, contusions. Joints are without erythema or swelling. Spine and CVA region are nontender Skin:  Warm, no rashes or suspicious lesions noted Neurologic:    Mental status is normal. CN 2-11 are normal. Gross motor and sensory exams are normal. Normal gait.  Mild tremor Breast Exam: No mass, skin retraction or nipple discharge is appreciated in either breast. No axillary adenopathy. Fibrocystic changes are not noted    Commons side effects, risks, benefits, and alternatives for medications and treatment plan prescribed today were discussed, and the patient expressed understanding of the given instructions. Patient is instructed to call or message via MyChart if he/she has any questions or concerns regarding our treatment plan. No barriers to understanding were identified. We discussed Red Flag symptoms and signs in detail. Patient expressed understanding regarding what to do in case of urgent or emergency type symptoms.   Medication list was reconciled, printed and provided to the patient in AVS. Patient instructions and summary information was reviewed with the patient as documented in the AVS. This note was prepared with assistance of Dragon voice recognition software. Occasional wrong-word or sound-a-like  substitutions may have occurred due to the inherent limitations of voice recognition software

## 2018-07-25 LAB — PAIN MGMT, PROFILE 8 W/CONF, U
6 Acetylmorphine: NEGATIVE ng/mL (ref <10)
Alcohol Metabolites: NEGATIVE ng/mL (ref <500)
Amphetamines: NEGATIVE ng/mL (ref <500)
Benzodiazepines: NEGATIVE ng/mL (ref <100)
Buprenorphine, Urine: NEGATIVE ng/mL (ref <5)
Cocaine Metabolite: NEGATIVE ng/mL (ref <150)
Codeine: NEGATIVE ng/mL (ref <50)
Creatinine: 95.2 mg/dL
Hydrocodone: 138 ng/mL — ABNORMAL HIGH (ref <50)
Hydromorphone: 250 ng/mL — ABNORMAL HIGH (ref <50)
MDMA: NEGATIVE ng/mL (ref <500)
Marijuana Metabolite: NEGATIVE ng/mL (ref <20)
Morphine: NEGATIVE ng/mL (ref <50)
Norhydrocodone: 403 ng/mL — ABNORMAL HIGH (ref <50)
Opiates: POSITIVE ng/mL — AB (ref <100)
Oxidant: NEGATIVE ug/mL (ref <200)
Oxycodone: NEGATIVE ng/mL (ref <100)
pH: 6.87 (ref 4.5–9.0)

## 2018-07-25 LAB — HEPATITIS C ANTIBODY
Hepatitis C Ab: NONREACTIVE
SIGNAL TO CUT-OFF: 0.03 (ref <1.00)

## 2018-07-25 LAB — HIV ANTIBODY (ROUTINE TESTING W REFLEX): HIV 1&2 Ab, 4th Generation: NONREACTIVE

## 2018-07-31 ENCOUNTER — Telehealth: Payer: Self-pay | Admitting: Family Medicine

## 2018-07-31 ENCOUNTER — Other Ambulatory Visit: Payer: Self-pay | Admitting: *Deleted

## 2018-07-31 MED ORDER — POTASSIUM CHLORIDE CRYS ER 20 MEQ PO TBCR: 20.0000 meq | EXTENDED_RELEASE_TABLET | Freq: Two times a day (BID) | ORAL | 3 refills | Status: DC

## 2018-07-31 NOTE — Telephone Encounter (Signed)
Pt requesting refill on Potassium BID. Last filled by her previous PCP. Recent Potassium level was 4.2 on 07/22/18

## 2018-07-31 NOTE — Telephone Encounter (Signed)
Medication has been approved, pt aware

## 2018-07-31 NOTE — Telephone Encounter (Signed)
Awaiting approval from Dr. Andy 

## 2018-07-31 NOTE — Telephone Encounter (Unsigned)
Copied from CRM [id number]. Topic: Quick Communication - Rx Refill/Question >> Jul 31, 2018  8:40 AM Percival SpanishKennedy, Cheryl W wrote: Medication   potassium chloride SA (K-DUR,KLOR-CON) 20 MEQ tablet     pt will need a new RX previous RX was by Dr Kirtland BouchardK   Has the patient contacted their pharmacy yes   (Agent: If no, request that the patient contact the pharmacy for the refill.) (Agent: If yes, when and what did the pharmacy advise?)  [facility] [address]   Agent: Please be advised that RX refills may take up to 3 business days. We ask that you follow-up with your pharmacy.

## 2018-08-03 ENCOUNTER — Other Ambulatory Visit: Payer: Self-pay | Admitting: Specialist

## 2018-08-03 ENCOUNTER — Other Ambulatory Visit: Payer: Self-pay | Admitting: Internal Medicine

## 2018-08-03 DIAGNOSIS — G4452 New daily persistent headache (NDPH): Secondary | ICD-10-CM

## 2018-08-08 ENCOUNTER — Ambulatory Visit
Admission: RE | Admit: 2018-08-08 | Discharge: 2018-08-08 | Disposition: A | Discharge status: Home / Self Care | Payer: 59 | Source: Ambulatory Visit | Attending: Specialist | Admitting: Specialist

## 2018-08-08 DIAGNOSIS — G4452 New daily persistent headache (NDPH): Secondary | ICD-10-CM

## 2018-08-08 MED ORDER — GADOBENATE DIMEGLUMINE 529 MG/ML IV SOLN
17.0000 mL | Freq: Once | INTRAVENOUS | Status: AC | PRN
  Administered 2018-08-08: 17 mL via INTRAVENOUS

## 2018-08-14 ENCOUNTER — Other Ambulatory Visit: Payer: Self-pay | Admitting: Neurosurgery

## 2018-09-10 ENCOUNTER — Ambulatory Visit (INDEPENDENT_AMBULATORY_CARE_PROVIDER_SITE_OTHER): Payer: 59 | Admitting: Otolaryngology

## 2018-09-10 DIAGNOSIS — J322 Chronic ethmoidal sinusitis: Secondary | ICD-10-CM

## 2018-09-10 DIAGNOSIS — J321 Chronic frontal sinusitis: Secondary | ICD-10-CM | POA: Diagnosis not present

## 2018-09-23 ENCOUNTER — Other Ambulatory Visit (HOSPITAL_COMMUNITY): Payer: Self-pay | Admitting: Otolaryngology

## 2018-09-23 ENCOUNTER — Other Ambulatory Visit: Payer: Self-pay | Admitting: Otolaryngology

## 2018-09-23 DIAGNOSIS — J320 Chronic maxillary sinusitis: Secondary | ICD-10-CM

## 2018-10-23 ENCOUNTER — Other Ambulatory Visit: Payer: Self-pay

## 2018-10-23 ENCOUNTER — Ambulatory Visit (INDEPENDENT_AMBULATORY_CARE_PROVIDER_SITE_OTHER): Payer: 59 | Admitting: Family Medicine

## 2018-10-23 ENCOUNTER — Ambulatory Visit: Payer: 59 | Admitting: Family Medicine

## 2018-10-23 ENCOUNTER — Encounter: Payer: Self-pay | Admitting: Family Medicine

## 2018-10-23 VITALS — BP 113/82 | Temp 98.1°F | Resp 16 | Wt 178.2 lb

## 2018-10-23 DIAGNOSIS — M797 Fibromyalgia: Secondary | ICD-10-CM | POA: Diagnosis not present

## 2018-10-23 DIAGNOSIS — Z79899 Other long term (current) drug therapy: Secondary | ICD-10-CM

## 2018-10-23 DIAGNOSIS — F339 Major depressive disorder, recurrent, unspecified: Secondary | ICD-10-CM

## 2018-10-23 DIAGNOSIS — F1190 Opioid use, unspecified, uncomplicated: Secondary | ICD-10-CM | POA: Diagnosis not present

## 2018-10-23 DIAGNOSIS — R519 Headache, unspecified: Secondary | ICD-10-CM

## 2018-10-23 DIAGNOSIS — R51 Headache: Secondary | ICD-10-CM | POA: Diagnosis not present

## 2018-10-23 DIAGNOSIS — G935 Compression of brain: Secondary | ICD-10-CM

## 2018-10-23 DIAGNOSIS — G43009 Migraine without aura, not intractable, without status migrainosus: Secondary | ICD-10-CM

## 2018-10-23 DIAGNOSIS — G894 Chronic pain syndrome: Secondary | ICD-10-CM | POA: Diagnosis not present

## 2018-10-23 HISTORY — DX: Other long term (current) drug therapy: Z79.899

## 2018-10-23 MED ORDER — HYDROCODONE-ACETAMINOPHEN 5-325 MG PO TABS: 1.0000 | ORAL_TABLET | Freq: Three times a day (TID) | ORAL | 0 refills | Status: DC | PRN

## 2018-10-23 MED ORDER — VENLAFAXINE HCL 37.5 MG PO TABS: 37.5000 mg | ORAL_TABLET | Freq: Two times a day (BID) | ORAL | 3 refills | Status: DC

## 2018-10-23 NOTE — Progress Notes (Signed)
Virtual Visit via Video Note  Subjective  CC:  Chief Complaint  Patient presents with   Follow-up   Pain     I connected with [name] on 10/23/18 at  9:20 AM EDT by a video enabled telemedicine application and verified that I am speaking with the correct person using two identifiers. Location patient: Home Location provider: [facility] [place] [place] Persons participating in the virtual visit: [name], Willow Oraamille L Betina Puckett, MD Rita Oharaiara Simmons, CMA  I discussed the limitations of evaluation and management by telemedicine and the availability of in person appointments. The patient expressed understanding and agreed to proceed. HPI: [name] is a 63 y.o. female who was contacted today to address the problems listed above in the chief complaint. And depression, headaches.  Chronic pain from fibromyalgia and lumbar DJD: stable on medications. Due for refill.   Chronic Opioid management for chronic pain: 1. Current Medications: see current medication list 2. Side Effects: denies constipation, sedation, falls, or confusion. 3. Medication Efficacy: continues to provide relief of chronic pain 4. Last UDS: jan 2020 5. Controlled substance database reviewed and appropriate: Yes  F/u depression: mood is stable as well inspite of stressors. Needs effexor refilled. Klonopin use is stable and manages pain and sleep.   F/u headaches: we referred her to Dr Neale BurlyFreeman due to worsening progressive atypical headaches: dxd with chiari malformation by brain MRI and referred to neurosurgery.  She is now scheduled for routine compression surgery in May.  This does explain her increased exacerbation of headaches.  She was given baclofen by Dr. Neale BurlyFreeman and this has helped treat her headaches and improve her sleep.  She seems to be coping with a new diagnosis well.  She is a bit nervous about surgery.  She also saw ENT for possible chronic sinusitis.  I do not have those records for  review at this time.  I will request records from specialist.  None are available.  She denies any new symptoms.  No neurologic problems.  [facility] 10/23/2018 07/22/2018 06/19/2018 12/04/2016 11/28/2015  Decreased Interest 0 1 0 0 0  Down, Depressed, Hopeless 1 0 0 0 1  PHQ - 2 Score 1 1 0 0 1  Altered sleeping 0 0 0 - -  Tired, decreased energy 2 2 0 - -  Change in appetite 0 3 0 - -  Feeling bad or failure about yourself  1 2 0 - -  Trouble concentrating 0 0 0 - -  Moving slowly or fidgety/restless 0 0 0 - -  Suicidal thoughts 0 0 0 - -  PHQ-9 Score 4 8 0 - -  Difficult doing work/chores Not difficult at all Not difficult at all Not difficult at all - -     Assessment  1. Chronic pain syndrome   2. Fibromyalgia   3. Chronic narcotic use   4. Headache, unspecified headache type   5. Migraine without aura and without status migrainosus, not intractable   6. Major depression, recurrent, chronic (HCC)   7. Chronic prescription benzodiazepine use   8. Chiari malformation type I (HCC)      Plan   Chronic pain and narcotic use:  Refilled medications. Use remains stable and pain is adequately controlled. Recheck 3 months.   Worsening headaches due to Chiari malformation: Scheduled for surgery.  She will follow-up with neurosurgery.  Continue current medications.  Depression migraines are stable on current medications.  Refilled Effexor.  Continues to use benzodiazepines daily and appropriately  I discussed the assessment and treatment plan with the patient. The patient was provided an opportunity to ask questions and all were answered. The patient agreed with the plan and demonstrated an understanding of the instructions.   The patient was advised to call back or seek an in-person evaluation if the symptoms worsen or if the condition fails to improve as anticipated. Follow up: Return in about 3 months (around 01/22/2019) for follow up Hypertension, pain management f/u.    Visit date not found  Meds ordered this encounter  Medications   HYDROcodone-acetaminophen (NORCO/VICODIN) 5-325 MG tablet    Sig: Take 1 tablet by mouth 3 (three) times daily as needed for moderate pain.    Dispense:  90 tablet    Refill:  0   venlafaxine (EFFEXOR) 37.5 MG tablet    Sig: Take 1 tablet (37.5 mg total) by mouth 2 (two) times daily.    Dispense:  180 tablet    Refill:  3      I reviewed the patients updated PMH, FH, and SocHx.    Patient Active Problem List   Diagnosis Date Noted   Chronic prescription benzodiazepine use 10/23/2018    Priority: High   Chiari malformation type I (HCC) 10/23/2018   Chronic narcotic use 06/19/2018   Hypokalemia 06/19/2018   Migraine without aura and without status migrainosus, not intractable 06/19/2018   Fibromyalgia 12/01/2012   Major depression, recurrent, chronic (HCC) 05/21/2010   Dyslipidemia 12/13/2008   Collagenous colitis 01/01/2008   Chronic pain 12/02/2007   Essential hypertension 04/28/2007   History of recurrent UTI (urinary tract infection) 04/28/2007   Nephrolithiasis 04/28/2007   Current Meds  Medication Sig   aspirin 81 MG tablet Take 81 mg by mouth daily.     atorvastatin (LIPITOR) 40 MG tablet Take 1 tablet (40 mg total) by mouth daily.   baclofen (LIORESAL) 10 MG tablet Take 10 mg by mouth daily as needed for headache.   Cholecalciferol (VITAMIN D) 1000 UNITS capsule Take 1,000 Units by mouth daily.     clonazePAM (KLONOPIN) 0.5 MG tablet Take 1 tablet (0.5 mg total) by mouth daily.   loperamide (IMODIUM) 2 MG capsule Take 1 capsule (2 mg total) by mouth 4 (four) times daily as needed.   potassium chloride SA (K-DUR,KLOR-CON) 20 MEQ tablet Take 1 tablet (20 mEq total) by mouth 2 (two) times daily.   promethazine (PHENERGAN) 25 MG tablet Take 1 tablet (25 mg total) by mouth every 6 (six) hours as needed. for nausea   venlafaxine (EFFEXOR) 37.5 MG tablet Take 1 tablet (37.5 mg total)  by mouth 2 (two) times daily.   verapamil (CALAN-SR) 240 MG CR tablet Take 1 tablet (240 mg total) by mouth at bedtime.   zonisamide (ZONEGRAN) 25 MG capsule    [DISCONTINUED] HYDROcodone-acetaminophen (NORCO/VICODIN) 5-325 MG tablet Take 1 tablet by mouth 3 (three) times daily as needed for moderate pain.   [DISCONTINUED] venlafaxine (EFFEXOR) 37.5 MG tablet Take 1 tablet (37.5 mg total) by mouth 2 (two) times daily.    Allergies: Patient is allergic to sulfonamide derivatives; codeine sulfate; diphenhydramine hcl; moxifloxacin; tramadol hcl; and penicillins. Family History: Patient family history includes Alcohol abuse in her father; Arrhythmia in her daughter; Healthy in her sister; Hyperlipidemia in her brother, brother, daughter, and mother; Hypertension in her brother, brother, and mother; Parkinson's disease in her mother; Polycystic ovary syndrome in her daughter. Social History:  Patient  reports that she quit  smoking about 11 years ago. Her smoking use included cigarettes. She smoked 1.00 pack per day. She has never used smokeless tobacco. She reports that she does not drink alcohol or use drugs.  Review of Systems: Constitutional: Negative for fever malaise or anorexia Cardiovascular: negative for chest pain Respiratory: negative for SOB or persistent cough Gastrointestinal: negative for abdominal pain  OBJECTIVE Vitals: BP 113/82    Temp 98.1 F (36.7 C) (Oral)    Resp 16    Wt 178 lb 3.2 oz (80.8 kg)    BMI 30.59 kg/m  General: no acute distress , A&Ox3, appears well Psych: Normal speech, normal cognition, not sedated.  Willow Ora, MD

## 2018-10-23 NOTE — Patient Instructions (Addendum)
Please return in 3 months for for follow up of your hypertension and pain management.  Please send me a mychart renewal request for your next due refill of pain medication.   If you have any questions or concerns, please don't hesitate to send me a message via MyChart or call the office at [phone]. Thank you for visiting with [place] today! It's our pleasure caring for you.

## 2018-10-26 ENCOUNTER — Ambulatory Visit (HOSPITAL_COMMUNITY): Payer: 59

## 2018-10-26 ENCOUNTER — Encounter (HOSPITAL_COMMUNITY): Payer: Self-pay

## 2018-11-20 ENCOUNTER — Other Ambulatory Visit: Payer: Self-pay

## 2018-11-20 ENCOUNTER — Ambulatory Visit (HOSPITAL_COMMUNITY)
Admission: RE | Admit: 2018-11-20 | Discharge: 2018-11-20 | Disposition: A | Discharge status: Home / Self Care | Payer: 59 | Source: Ambulatory Visit | Attending: Otolaryngology | Admitting: Otolaryngology

## 2018-11-20 DIAGNOSIS — J320 Chronic maxillary sinusitis: Secondary | ICD-10-CM | POA: Diagnosis present

## 2018-11-27 ENCOUNTER — Encounter: Payer: Self-pay | Admitting: *Deleted

## 2018-12-04 ENCOUNTER — Other Ambulatory Visit: Payer: Self-pay | Admitting: Family Medicine

## 2018-12-04 ENCOUNTER — Other Ambulatory Visit: Payer: Self-pay | Admitting: Neurosurgery

## 2018-12-04 ENCOUNTER — Other Ambulatory Visit: Payer: Self-pay | Admitting: *Deleted

## 2018-12-04 ENCOUNTER — Telehealth: Payer: Self-pay | Admitting: Family Medicine

## 2018-12-04 MED ORDER — ATORVASTATIN CALCIUM 40 MG PO TABS: 40.0000 mg | ORAL_TABLET | Freq: Every day | ORAL | 1 refills | Status: DC

## 2018-12-04 MED ORDER — HYDROCODONE-ACETAMINOPHEN 5-325 MG PO TABS: 1.0000 | ORAL_TABLET | Freq: Three times a day (TID) | ORAL | 0 refills | Status: DC | PRN

## 2018-12-04 NOTE — Telephone Encounter (Unsigned)
Copied from CRM [id number]. Topic: Quick Communication - Rx Refill/Question >> Dec 04, 2018 11:19 AM Gwenlyn Fudge A wrote: Medication: atorvastatin (LIPITOR) 40 MG tablet  Has the patient contacted their pharmacy? No. Pt states she has seen PCP regarding this medication. Please advise.  (Agent: If no, request that the patient contact the pharmacy for the refill.) (Agent: If yes, when and what did the pharmacy advise?)  Preferred Pharmacy (with phone number or street name): [facility] mail [facility]  Agent: Please be advised that RX refills may take up to 3 business days. We ask that you follow-up with your pharmacy.

## 2018-12-04 NOTE — Telephone Encounter (Signed)
See note

## 2018-12-04 NOTE — Pre-Procedure Instructions (Signed)
Beecher McardleJoann L Pipkins  12/04/2018     [id number] [place] [facility] [address] [facility] [address] [place] [address] Phone: [phone] Fax: [phone]  [facility] [address] - [place], [place] - [address]  [place] [address] Phone: [phone] Fax: [phone]   Your procedure is scheduled on Friday, June 5th  [facility] at 5:30 A.M.  Call this number if you have problems the morning of surgery:  [phone]   Remember:  Do not eat or drink after midnight.     Take these medicines the morning of surgery with A SIP OF WATER atorvastatin (LIPITOR) venlafaxine (EFFEXOR)  If needed - baclofen (LIORESAL), HYDROcodone-acetaminophen (NORCO/VICODIN) ,  promethazine (PHENERGAN)  Follow your surgeon's instructions on when to stop Aspirin.  If no instructions were given by your surgeon then you will need to call the office to get those instructions.    7 days prior to surgery STOP taking any Aspirin (unless otherwise instructed by your surgeon), Aleve, Naproxen, Ibuprofen, Motrin, Advil, Goody's, BC's, all herbal medications, fish oil, and all vitamins.    Do not wear jewelry, make-up or nail polish.  Do not wear lotions, powders, or perfumes, or deodorant.  Do not shave 48 hours prior to surgery.    Do not bring valuables to the hospital.  [facility] is not responsible for any belongings or valuables.  Contacts, dentures or bridgework may not be worn into surgery.  Leave your suitcase in the car.  After surgery it may be brought to your room.  For patients admitted to the hospital, discharge time will be determined by your treatment team.  Patients discharged the day of surgery will not be allowed to drive home.   Special instructions:  [place]- Preparing For Surgery  Before surgery, you can play an important role. Because skin is not sterile, your skin needs  to be as free of germs as possible. You can reduce the number of germs on your skin by washing with CHG (chlorahexidine gluconate) Soap before surgery.  CHG is an antiseptic cleaner which kills germs and bonds with the skin to continue killing germs even after washing.    Oral Hygiene is also important to reduce your risk of infection.  Remember - BRUSH YOUR TEETH THE MORNING OF SURGERY WITH YOUR REGULAR TOOTHPASTE  Please do not use if you have an allergy to CHG or antibacterial soaps. If your skin becomes reddened/irritated stop using the CHG.  Do not shave (including legs and underarms) for at least 48 hours prior to first CHG shower. It is OK to shave your face.  Please follow these instructions carefully.   1. Shower the NIGHT BEFORE SURGERY and the MORNING OF SURGERY with CHG.   2. If you chose to wash your hair, wash your hair first as usual with your normal shampoo.  3. After you shampoo, rinse your hair and body thoroughly to remove the shampoo.  4. Use CHG as you would any other liquid soap. You can apply CHG directly to the skin and wash gently with a scrungie or a clean washcloth.   5. Apply the CHG Soap to your body ONLY FROM THE NECK DOWN.  Do not use on open wounds or open sores. Avoid contact with your eyes, ears, mouth and genitals (private parts). Wash Face and genitals (private parts)  with your normal soap.  6. Wash thoroughly,  paying special attention to the area where your surgery will be performed.  7. Thoroughly rinse your body with warm water from the neck down.  8. DO NOT shower/wash with your normal soap after using and rinsing off the CHG Soap.  9. Pat yourself dry with a CLEAN TOWEL.  10. Wear CLEAN PAJAMAS to bed the night before surgery, wear comfortable clothes the morning of surgery  11. Place CLEAN SHEETS on your bed the night of your first shower and DO NOT SLEEP WITH PETS.  Day of Surgery:  Do not apply any deodorants/lotions.  Please wear clean  clothes to the hospital/surgery center.   Remember to brush your teeth WITH YOUR REGULAR TOOTHPASTE.  Please read over the following fact sheets that you were given. Pain Booklet, Coughing and Deep Breathing, MRSA Information and Surgical Site Infection Prevention

## 2018-12-04 NOTE — Telephone Encounter (Signed)
Pt aware RXs refilled

## 2018-12-04 NOTE — Telephone Encounter (Signed)
Pt is aware medications have been refilled

## 2018-12-04 NOTE — Telephone Encounter (Signed)
Copied from CRM [id number]. Topic: Quick Communication - Rx Refill/Question >> Dec 04, 2018 11:17 AM Gwenlyn Fudge A wrote: Medication: HYDROcodone-acetaminophen (NORCO/VICODIN) 5-325 MG tablet   Has the patient contacted their pharmacy? No. (Agent: If no, request that the patient contact the pharmacy for the refill.) (Agent: If yes, when and what did the pharmacy advise?)  Preferred Pharmacy (with phone number or street name): [facility] [address] [place] - [address] [place] [address] Phone: [phone] Fax: [phone] Not a 24 hour pharmacy; exact hours not known.    Agent: Please be advised that RX refills may take up to 3 business days. We ask that you follow-up with your pharmacy.

## 2018-12-07 ENCOUNTER — Other Ambulatory Visit: Payer: Self-pay | Admitting: Family Medicine

## 2018-12-07 ENCOUNTER — Other Ambulatory Visit: Payer: Self-pay

## 2018-12-07 ENCOUNTER — Encounter (HOSPITAL_COMMUNITY)
Admission: RE | Admit: 2018-12-07 | Discharge: 2018-12-07 | Disposition: A | Discharge status: Home / Self Care | Payer: 59 | Source: Ambulatory Visit | Attending: Neurosurgery | Admitting: Neurosurgery

## 2018-12-07 ENCOUNTER — Other Ambulatory Visit (HOSPITAL_COMMUNITY)
Admission: RE | Admit: 2018-12-07 | Discharge: 2018-12-07 | Disposition: A | Discharge status: Home / Self Care | Payer: 59 | Source: Ambulatory Visit | Attending: Neurosurgery | Admitting: Neurosurgery

## 2018-12-07 ENCOUNTER — Encounter (HOSPITAL_COMMUNITY): Payer: Self-pay

## 2018-12-07 DIAGNOSIS — Z1159 Encounter for screening for other viral diseases: Secondary | ICD-10-CM | POA: Diagnosis not present

## 2018-12-07 DIAGNOSIS — Z01818 Encounter for other preprocedural examination: Secondary | ICD-10-CM | POA: Diagnosis not present

## 2018-12-07 HISTORY — DX: Personal history of urinary calculi: Z87.442

## 2018-12-07 HISTORY — DX: Other specified postprocedural states: Z98.890

## 2018-12-07 HISTORY — DX: Other specified postprocedural states: R11.2

## 2018-12-07 LAB — BASIC METABOLIC PANEL
Anion gap: 11 (ref 5–15)
BUN: 12 mg/dL (ref 8–23)
CO2: 20 mmol/L — ABNORMAL LOW (ref 22–32)
Calcium: 9.1 mg/dL (ref 8.9–10.3)
Chloride: 107 mmol/L (ref 98–111)
Creatinine, Ser: 1.22 mg/dL — ABNORMAL HIGH (ref 0.44–1.00)
GFR calc Af Amer: 55 mL/min — ABNORMAL LOW (ref >60)
GFR calc non Af Amer: 48 mL/min — ABNORMAL LOW (ref >60)
Glucose, Bld: 97 mg/dL (ref 70–99)
Potassium: 4 mmol/L (ref 3.5–5.1)
Sodium: 138 mmol/L (ref 135–145)

## 2018-12-07 LAB — CBC
HCT: 42 % (ref 36.0–46.0)
Hemoglobin: 13.2 g/dL (ref 12.0–15.0)
MCH: 29.5 pg (ref 26.0–34.0)
MCHC: 31.4 g/dL (ref 30.0–36.0)
MCV: 93.8 fL (ref 80.0–100.0)
Platelets: 264 10*3/uL (ref 150–400)
RBC: 4.48 MIL/uL (ref 3.87–5.11)
RDW: 12.3 % (ref 11.5–15.5)
WBC: 6.6 10*3/uL (ref 4.0–10.5)
nRBC: 0 % (ref 0.0–0.2)

## 2018-12-07 LAB — SURGICAL PCR SCREEN
MRSA, PCR: NEGATIVE
Staphylococcus aureus: POSITIVE — AB

## 2018-12-07 MED ORDER — HYDROCODONE-ACETAMINOPHEN 5-325 MG PO TABS: 1.0000 | ORAL_TABLET | Freq: Three times a day (TID) | ORAL | 0 refills | Status: DC | PRN

## 2018-12-07 NOTE — Telephone Encounter (Signed)
Copied from CRM [id number]. Topic: Quick Communication - Rx Refill/Question >> Dec 07, 2018  9:20 AM Burchel, Abbi R wrote: Medication: HYDROcodone-acetaminophen (NORCO/VICODIN) 5-325 MG tablet  Pt states this rx was called in to the wrong pharmacy. [facility]  Preferred Pharmacy: [facility] [address], [place] - [address] [place] [phone] (Phone) [phone] (Fax)

## 2018-12-07 NOTE — Progress Notes (Signed)
[facility] [address] [place] - [address] [place] [address] Phone: [phone] Fax: [phone]      Your procedure is scheduled on June 5  Report to [facility] Main Entrance "A" at 0530 A.M., and check in at the Admitting office.  Call this number if you have problems the morning of surgery:  [phone]  Call [phone] if you have any questions prior to your surgery date Monday-Friday 8am-4pm    Remember:  Do not eat or drink after midnight.     Take these medicines the morning of surgery with A SIP OF WATER  baclofen (LIORESAL)  atorvastatin (LIPITOR HYDROcodone-acetaminophen (NORCO/VICODIN) if needed for pain venlafaxine [facility])   7 days prior to surgery STOP taking any Aspirin (unless otherwise instructed by your surgeon), Aleve, Naproxen, Ibuprofen, Motrin, Advil, Goody's, BC's, all herbal medications, fish oil, and all vitamins.  Follow your surgeon's instructions on when to stop Aspirin.  If no instructions were given by your surgeon then you will need to call the office to get those instructions.      The Morning of Surgery  Do not wear jewelry, make-up or nail polish.  Do not wear lotions, powders, or perfumes/colognes, or deodorant  Do not shave 48 hours prior to surgery.   Do not bring valuables to the hospital.  [facility] is not responsible for any belongings or valuables.  If you are a smoker, DO NOT Smoke 24 hours prior to surgery IF you wear a CPAP at night please bring your mask, tubing, and machine the morning of surgery   Remember that you must have someone to transport you home after your surgery, and remain with you for 24 hours if you are discharged the same day.   Contacts, glasses, hearing aids, dentures or bridgework may not be worn into surgery.    Leave your suitcase in the car.  After surgery it may be brought to your room.  For patients admitted to the hospital,  discharge time will be determined by your treatment team.  Patients discharged the day of surgery will not be allowed to drive home.    Special instructions:   [place]- Preparing For Surgery  Before surgery, you can play an important role. Because skin is not sterile, your skin needs to be as free of germs as possible. You can reduce the number of germs on your skin by washing with CHG (chlorahexidine gluconate) Soap before surgery.  CHG is an antiseptic cleaner which kills germs and bonds with the skin to continue killing germs even after washing.    Oral Hygiene is also important to reduce your risk of infection.  Remember - BRUSH YOUR TEETH THE MORNING OF SURGERY WITH YOUR REGULAR TOOTHPASTE  Please do not use if you have an allergy to CHG or antibacterial soaps. If your skin becomes reddened/irritated stop using the CHG.  Do not shave (including legs and underarms) for at least 48 hours prior to first CHG shower. It is OK to shave your face.  Please follow these instructions carefully.   1. Shower the NIGHT BEFORE SURGERY and the MORNING OF SURGERY with CHG Soap.   2. If you chose to wash your hair, wash your hair first as usual with your normal shampoo.  3. After you shampoo, rinse your hair and body thoroughly to remove the shampoo.  4. Use CHG as you would any other liquid soap. You can apply CHG directly to the skin  and wash gently with a scrungie or a clean washcloth.   5. Apply the CHG Soap to your body ONLY FROM THE NECK DOWN.  Do not use on open wounds or open sores. Avoid contact with your eyes, ears, mouth and genitals (private parts). Wash Face and genitals (private parts)  with your normal soap.   6. Wash thoroughly, paying special attention to the area where your surgery will be performed.  7. Thoroughly rinse your body with warm water from the neck down.  8. DO NOT shower/wash with your normal soap after using and rinsing off the CHG Soap.  9. Pat yourself dry  with a CLEAN TOWEL.  10. Wear CLEAN PAJAMAS to bed the night before surgery, wear comfortable clothes the morning of surgery  11. Place CLEAN SHEETS on your bed the night of your first shower and DO NOT SLEEP WITH PETS.    Day of Surgery:  Do not apply any deodorants/lotions.  Please wear clean clothes to the hospital/surgery center.   Remember to brush your teeth WITH YOUR REGULAR TOOTHPASTE.   Please read over the following fact sheets that you were given.

## 2018-12-07 NOTE — Telephone Encounter (Signed)
See note

## 2018-12-07 NOTE — Progress Notes (Signed)
PCP - Asencion Partridge Cardiologist - denies  Chest x-ray - not needed EKG - 12/07/18 Stress Test - denies ECHO -denies  Cardiac Cath - denies  Aspirin Instructions: stop today  Anesthesia review: yes, abnormal EKG  Patient denies shortness of breath, fever, cough and chest pain at PAT appointment   Patient verbalized understanding of instructions that were given to them at the PAT appointment. Patient was also instructed that they will need to review over the PAT instructions again at home before surgery.

## 2018-12-07 NOTE — Telephone Encounter (Signed)
Pt aware RX sent to [facility]

## 2018-12-09 LAB — NOVEL CORONAVIRUS, NAA (HOSP ORDER, SEND-OUT TO REF LAB; TAT 18-24 HRS): SARS-CoV-2, NAA: NOT DETECTED

## 2018-12-09 NOTE — H&P (Signed)
Chief Complaint   Chair malformation  HPI   HPI: [name] is a 63 y.o. female with primary complaint of pressure-type headaches for approximately 1 year.  She says she actually has a history of migraine headaches for many years, although those headaches actually resolved a few years ago.  Last year, without any identifiable inciting event, she says she was laying in bed and rolled over, and suddenly got "explosive" type headache in her head.  Since that time she has noted severe headache which is worsened whenever she leans over, coughs, sneezes, or strains in any way.  She does have a history of fibromyalgia, Raynaud's phenomenon, and chronic fatigue.  She has not however noted any changes in her ambulation, new numbness tingling or weakness in the arms or legs, or incoordination. She underwent an MRI of her brain which  Revealed Chiari 1 malformation.  She presents today for surgical decompression.  She is without any concerns.   Patient Active Problem List   Diagnosis Date Noted  . Chronic prescription benzodiazepine use 10/23/2018  . Chiari malformation type I (HCC) 10/23/2018  . Chronic narcotic use 06/19/2018  . Hypokalemia 06/19/2018  . Migraine without aura and without status migrainosus, not intractable 06/19/2018  . Fibromyalgia 12/01/2012  . Major depression, recurrent, chronic (HCC) 05/21/2010  . Dyslipidemia 12/13/2008  . Collagenous colitis 01/01/2008  . Chronic pain 12/02/2007  . Essential hypertension 04/28/2007  . History of recurrent UTI (urinary tract infection) 04/28/2007  . Nephrolithiasis 04/28/2007    PMH: Past Medical History:  Diagnosis Date  . Bladder stones   . Bursitis of shoulder   . Chronic prescription benzodiazepine use 10/23/2018  . 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 DEPRESSIVE DISORDER 05/21/2010  . Fibromyalgia   . History of kidney stones   . HYPERLIPIDEMIA 12/13/2008  . HYPERTENSION 04/28/2007  . LOW BACK PAIN 12/02/2007  . Migraine   .  Migraine without aura and without status migrainosus, not intractable 06/19/2018  . NEPHROLITHIASIS, HX OF 04/28/2007  . PONV (postoperative nausea and vomiting)   . Raynaud disease   . Renal colic 02/12/2008  . UTI'S, RECURRENT 04/28/2007    PSH: Past Surgical History:  Procedure Laterality Date  . ABDOMINAL HYSTERECTOMY  1990   fiboids, prolapse  . colonoscopy    . WISDOM TOOTH EXTRACTION      No medications prior to admission.    SH: Social History   Tobacco Use  . Smoking status: Former Smoker    Packs/day: 1.00    Types: Cigarettes    Last attempt to quit: 07/09/2007    Years since quitting: 11.4  . Smokeless tobacco: Never Used  Substance Use Topics  . Alcohol use: No  . Drug use: No    MEDS: Prior to Admission medications   Medication Sig Start Date End Date Taking? Authorizing Provider  aspirin EC 81 MG tablet Take 81 mg by mouth daily.   Yes [provider]  baclofen (LIORESAL) 10 MG tablet Take 10 mg by mouth 2 (two) times daily as needed (spasms/headaches.).  08/03/18  Yes [provider]  Cholecalciferol (VITAMIN D) 1000 UNITS capsule Take 1,000 Units by mouth daily.     Yes [provider]  clonazePAM (KLONOPIN) 0.5 MG tablet Take 1 tablet (0.5 mg total) by mouth daily. Patient taking differently: Take 0.5 mg by mouth at bedtime.  06/24/18  Yes Willow Ora, MD  loperamide (IMODIUM) 2 MG capsule Take 1 capsule (2 mg total) by mouth 4 (  four) times daily as needed. Patient taking differently: Take 2 mg by mouth 4 (four) times daily as needed (collagenous colitis).  09/13/10  Yes Gordy SaversKwiatkowski, Peter F, MD  potassium chloride SA (K-DUR,KLOR-CON) 20 MEQ tablet Take 1 tablet (20 mEq total) by mouth 2 (two) times daily. 07/31/18  Yes Willow OraAndy, Camille L, MD  promethazine (PHENERGAN) 25 MG tablet Take 1 tablet (25 mg total) by mouth every 6 (six) hours as needed. for nausea Patient taking differently: Take 25 mg by mouth every 6 (six) hours as  needed (nausea (take with hydrocodone)).  10/21/17  Yes Gordy SaversKwiatkowski, Peter F, MD  Pseudoephedrine-APAP-DM (DAYQUIL PO) Take 2 capsules by mouth every 4 (four) hours as needed (cold symptoms.).   Yes [provider]  venlafaxine (EFFEXOR) 37.5 MG tablet Take 1 tablet (37.5 mg total) by mouth 2 (two) times daily. 10/23/18  Yes Willow OraAndy, Camille L, MD  verapamil (CALAN-SR) 240 MG CR tablet Take 1 tablet (240 mg total) by mouth at bedtime. 02/26/18  Yes Gordy SaversKwiatkowski, Peter F, MD  zonisamide (ZONEGRAN) 50 MG capsule Take 100 mg by mouth at bedtime. 10/28/18  Yes [provider]  atorvastatin (LIPITOR) 40 MG tablet Take 1 tablet (40 mg total) by mouth daily. 12/04/18   Willow OraAndy, Camille L, MD  HYDROcodone-acetaminophen (NORCO/VICODIN) 5-325 MG tablet Take 1 tablet by mouth 3 (three) times daily as needed for moderate pain. 12/07/18   Willow OraAndy, Camille L, MD    ALLERGY: Allergies  Allergen Reactions  . Sulfonamide Derivatives Hives and Rash  . Codeine Sulfate Hives and Itching  . Diphenhydramine Hcl Other (See Comments)    side effect of being irritable  . Moxifloxacin Hives and Itching  . Tramadol Hcl Nausea And Vomiting    "Doesn't agree with my system." Does not help pain and causes vomiting.  Marland Kitchen. Penicillins Hives and Itching    Tolerates cephalosporins Did it involve swelling of the face/tongue/throat, SOB, or low BP? No Did it involve sudden or severe rash/hives, skin peeling, or any reaction on the inside of your mouth or nose? No Did you need to seek medical attention at a hospital or doctor's office? No When did it last happen? If all above answers are "NO", may proceed with cephalosporin use.     Social History   Tobacco Use  . Smoking status: Former Smoker    Packs/day: 1.00    Types: Cigarettes    Last attempt to quit: 07/09/2007    Years since quitting: 11.4  . Smokeless tobacco: Never Used  Substance Use Topics  . Alcohol use: No     Family History  Problem Relation  Age of Onset  . Hyperlipidemia Mother   . Hypertension Mother   . Parkinson's disease Mother   . Alcohol abuse Father   . Healthy Sister   . Hyperlipidemia Brother   . Arrhythmia Daughter   . Hyperlipidemia Daughter   . Polycystic ovary syndrome Daughter   . Hypertension Brother   . Hyperlipidemia Brother   . Hypertension Brother   . Diabetes Neg Hx   . Cancer Neg Hx      ROS   ROS  Exam   There were no vitals filed for this visit. General appearance: WDWN, NAD Eyes: No scleral injection Cardiovascular: Regular rate and rhythm without murmurs, rubs, gallops. No edema or variciosities. Distal pulses normal. Pulmonary: Effort normal, non-labored breathing Musculoskeletal:     Muscle tone upper extremities: Normal    Muscle tone lower extremities: Normal    Motor  exam: Upper Extremities Deltoid Bicep Tricep Grip  Right 5/5 5/5 5/5 5/5  Left 5/5 5/5 5/5 5/5   Lower Extremity IP Quad PF DF EHL  Right 5/5 5/5 5/5 5/5 5/5  Left 5/5 5/5 5/5 5/5 5/5   Neurological Mental Status:    - Patient is awake, alert, oriented to person, place, month, year, and situation    - Patient is able to give a clear and coherent history.    - No signs of aphasia or neglect Cranial Nerves    - II: Visual Fields are full. PERRL    - III/IV/VI: EOMI without ptosis or diploplia.     - V: Facial sensation is grossly normal    - VII: Facial movement is symmetric.     - VIII: hearing is intact to voice    - X: Uvula elevates symmetrically    - XI: Shoulder shrug is symmetric.    - XII: tongue is midline without atrophy or fasciculations.  Sensory: Sensation grossly intact to LT  Results - Imaging/Labs   No results found for this or any previous visit (from the past 48 hour(s)).  No results found.  IMAGING: MRI of the brain dated 08/08/2018 was reviewed.  This does demonstrate Chiari 1 malformation, with tonsillar descent of approximately 12 mm to the level of the ring of C1.  There is  significant crowding of the foramen magnum, with some mass effect upon the right side of the cervical medullary junction.  Although no dedicated cervical spine MRI is completed, the upper cervical spinal cord is visualized to the level of approximately C4-5.  No syrinx is identified.  Impression/Plan   63 y.o. female with fairly typical Chiari type headaches without signs or symptoms of myelopathy or radiographic evidence of at least upper cervical syrinx. we will plan on proceeding with Chiari decompression including suboccipital craniectomy, and expansile duraplasty  Risks of the Chiari decompression were also reviewed including risk of stroke or brain stem injury leading to weakness, numbness, or paralysis, bleeding, infection, and CSF leak.  General risks of anesthesia including heart attack, stroke, and blood clots were also reviewed.  In addition, I did explain to her that there is a possibility of continued or worsening headaches after surgery.  Patient appeared to understand our discussion and is willing to proceed with surgery.  All her questions today were answered.  Cindra Presume, PA-C [facility]

## 2018-12-11 ENCOUNTER — Inpatient Hospital Stay (HOSPITAL_COMMUNITY): Payer: 59 | Admitting: Physician Assistant

## 2018-12-11 ENCOUNTER — Encounter (HOSPITAL_COMMUNITY)
Admission: RE | Disposition: A | Discharge status: Home / Self Care | Payer: Self-pay | Source: Home / Self Care | Attending: Neurosurgery

## 2018-12-11 ENCOUNTER — Encounter (HOSPITAL_COMMUNITY): Payer: Self-pay | Admitting: Certified Registered Nurse Anesthetist

## 2018-12-11 ENCOUNTER — Other Ambulatory Visit: Payer: Self-pay

## 2018-12-11 ENCOUNTER — Inpatient Hospital Stay (HOSPITAL_COMMUNITY): Payer: 59 | Admitting: Certified Registered Nurse Anesthetist

## 2018-12-11 ENCOUNTER — Inpatient Hospital Stay (HOSPITAL_COMMUNITY)
Admission: RE | Admit: 2018-12-11 | Discharge: 2018-12-14 | DRG: 027 | Disposition: A | Discharge status: Home / Self Care | Payer: 59 | Attending: Neurosurgery | Admitting: Neurosurgery

## 2018-12-11 DIAGNOSIS — R5382 Chronic fatigue, unspecified: Secondary | ICD-10-CM | POA: Diagnosis present

## 2018-12-11 DIAGNOSIS — R51 Headache: Secondary | ICD-10-CM | POA: Diagnosis present

## 2018-12-11 DIAGNOSIS — I7300 Raynaud's syndrome without gangrene: Secondary | ICD-10-CM | POA: Diagnosis present

## 2018-12-11 DIAGNOSIS — Z7982 Long term (current) use of aspirin: Secondary | ICD-10-CM | POA: Diagnosis not present

## 2018-12-11 DIAGNOSIS — Z885 Allergy status to narcotic agent status: Secondary | ICD-10-CM

## 2018-12-11 DIAGNOSIS — Z8349 Family history of other endocrine, nutritional and metabolic diseases: Secondary | ICD-10-CM

## 2018-12-11 DIAGNOSIS — G935 Compression of brain: Secondary | ICD-10-CM | POA: Diagnosis present

## 2018-12-11 DIAGNOSIS — Z882 Allergy status to sulfonamides status: Secondary | ICD-10-CM

## 2018-12-11 DIAGNOSIS — Z87891 Personal history of nicotine dependence: Secondary | ICD-10-CM

## 2018-12-11 DIAGNOSIS — Z811 Family history of alcohol abuse and dependence: Secondary | ICD-10-CM | POA: Diagnosis not present

## 2018-12-11 DIAGNOSIS — Z8249 Family history of ischemic heart disease and other diseases of the circulatory system: Secondary | ICD-10-CM

## 2018-12-11 DIAGNOSIS — E785 Hyperlipidemia, unspecified: Secondary | ICD-10-CM | POA: Diagnosis present

## 2018-12-11 DIAGNOSIS — K52831 Collagenous colitis: Secondary | ICD-10-CM | POA: Diagnosis present

## 2018-12-11 DIAGNOSIS — Z820 Family history of epilepsy and other diseases of the nervous system: Secondary | ICD-10-CM

## 2018-12-11 DIAGNOSIS — I10 Essential (primary) hypertension: Secondary | ICD-10-CM | POA: Diagnosis present

## 2018-12-11 DIAGNOSIS — M797 Fibromyalgia: Secondary | ICD-10-CM | POA: Diagnosis present

## 2018-12-11 DIAGNOSIS — Z880 Allergy status to penicillin: Secondary | ICD-10-CM

## 2018-12-11 HISTORY — PX: SUBOCCIPITAL CRANIECTOMY CERVICAL LAMINECTOMY: SHX5404

## 2018-12-11 SURGERY — SUBOCCIPITAL CRANIECTOMY CERVICAL LAMINECTOMY/DURAPLASTY
Anesthesia: General

## 2018-12-11 MED ORDER — PROPOFOL 10 MG/ML IV BOLUS
INTRAVENOUS | Status: AC
  Filled 2018-12-11: qty 20

## 2018-12-11 MED ORDER — LIDOCAINE-EPINEPHRINE 1 %-1:100000 IJ SOLN
INTRAMUSCULAR | Status: DC | PRN
  Administered 2018-12-11: 5 mL

## 2018-12-11 MED ORDER — VANCOMYCIN HCL IN DEXTROSE 1-5 GM/200ML-% IV SOLN
INTRAVENOUS | Status: AC
  Administered 2018-12-11: 1000 mg via INTRAVENOUS
  Filled 2018-12-11: qty 200

## 2018-12-11 MED ORDER — BUPIVACAINE HCL (PF) 0.5 % IJ SOLN
INTRAMUSCULAR | Status: DC | PRN
  Administered 2018-12-11: 5 mL

## 2018-12-11 MED ORDER — CHLORHEXIDINE GLUCONATE CLOTH 2 % EX PADS: 6.0000 | MEDICATED_PAD | Freq: Every day | CUTANEOUS | Status: DC

## 2018-12-11 MED ORDER — DOCUSATE SODIUM 100 MG PO CAPS
100.0000 mg | ORAL_CAPSULE | Freq: Two times a day (BID) | ORAL | Status: DC
  Administered 2018-12-12 – 2018-12-13 (×4): 100 mg via ORAL
  Filled 2018-12-11 (×4): qty 1

## 2018-12-11 MED ORDER — THROMBIN 20000 UNITS EX SOLR
CUTANEOUS | Status: DC | PRN
  Administered 2018-12-11: 20 mL via TOPICAL

## 2018-12-11 MED ORDER — ZONISAMIDE 100 MG PO CAPS
100.0000 mg | ORAL_CAPSULE | Freq: Every day | ORAL | Status: DC
  Administered 2018-12-12 – 2018-12-13 (×2): 100 mg via ORAL
  Filled 2018-12-11 (×4): qty 1

## 2018-12-11 MED ORDER — BACLOFEN 10 MG PO TABS
10.0000 mg | ORAL_TABLET | Freq: Two times a day (BID) | ORAL | Status: DC | PRN
  Administered 2018-12-12 – 2018-12-14 (×6): 10 mg via ORAL
  Filled 2018-12-11 (×7): qty 1

## 2018-12-11 MED ORDER — ACETAMINOPHEN 325 MG PO TABS
650.0000 mg | ORAL_TABLET | ORAL | Status: DC | PRN
  Filled 2018-12-11: qty 2

## 2018-12-11 MED ORDER — LACTATED RINGERS IV SOLN
INTRAVENOUS | Status: DC | PRN
  Administered 2018-12-11: 12:00:00 via INTRAVENOUS

## 2018-12-11 MED ORDER — LABETALOL HCL 5 MG/ML IV SOLN
10.0000 mg | INTRAVENOUS | Status: DC | PRN
  Administered 2018-12-11 – 2018-12-13 (×5): 10 mg via INTRAVENOUS
  Filled 2018-12-11 (×4): qty 4
  Filled 2018-12-11: qty 8

## 2018-12-11 MED ORDER — PROMETHAZINE HCL 25 MG/ML IJ SOLN
6.2500 mg | INTRAMUSCULAR | Status: DC | PRN
  Administered 2018-12-11: 6.25 mg via INTRAVENOUS

## 2018-12-11 MED ORDER — LOPERAMIDE HCL 2 MG PO CAPS: 2.0000 mg | ORAL_CAPSULE | Freq: Four times a day (QID) | ORAL | Status: DC | PRN

## 2018-12-11 MED ORDER — LIDOCAINE-EPINEPHRINE 1 %-1:100000 IJ SOLN
INTRAMUSCULAR | Status: AC
  Filled 2018-12-11: qty 1

## 2018-12-11 MED ORDER — ATORVASTATIN CALCIUM 40 MG PO TABS
40.0000 mg | ORAL_TABLET | Freq: Every day | ORAL | Status: DC
  Administered 2018-12-12 – 2018-12-14 (×3): 40 mg via ORAL
  Filled 2018-12-11 (×2): qty 1
  Filled 2018-12-11: qty 4

## 2018-12-11 MED ORDER — VANCOMYCIN HCL 1000 MG IV SOLR
INTRAVENOUS | Status: DC | PRN
  Administered 2018-12-11: 1000 mg via INTRAVENOUS

## 2018-12-11 MED ORDER — FENTANYL CITRATE (PF) 100 MCG/2ML IJ SOLN
25.0000 ug | INTRAMUSCULAR | Status: DC | PRN
  Administered 2018-12-11 (×4): 25 ug via INTRAVENOUS

## 2018-12-11 MED ORDER — ONDANSETRON HCL 4 MG/2ML IJ SOLN
4.0000 mg | INTRAMUSCULAR | Status: DC | PRN
  Administered 2018-12-11 – 2018-12-12 (×3): 4 mg via INTRAVENOUS
  Filled 2018-12-11 (×3): qty 2

## 2018-12-11 MED ORDER — SODIUM CHLORIDE 0.9 % IV SOLN
INTRAVENOUS | Status: DC
  Administered 2018-12-11 – 2018-12-13 (×3): via INTRAVENOUS

## 2018-12-11 MED ORDER — CHLORHEXIDINE GLUCONATE CLOTH 2 % EX PADS: 6.0000 | MEDICATED_PAD | Freq: Once | CUTANEOUS | Status: DC

## 2018-12-11 MED ORDER — BUPIVACAINE HCL (PF) 0.5 % IJ SOLN
INTRAMUSCULAR | Status: AC
  Filled 2018-12-11: qty 30

## 2018-12-11 MED ORDER — PROMETHAZINE HCL 25 MG PO TABS
25.0000 mg | ORAL_TABLET | Freq: Four times a day (QID) | ORAL | Status: DC | PRN
  Administered 2018-12-13 – 2018-12-14 (×5): 25 mg via ORAL
  Filled 2018-12-11 (×5): qty 1

## 2018-12-11 MED ORDER — VITAMIN D 25 MCG (1000 UNIT) PO TABS
1000.0000 [IU] | ORAL_TABLET | Freq: Every day | ORAL | Status: DC
  Administered 2018-12-12 – 2018-12-14 (×3): 1000 [IU] via ORAL
  Filled 2018-12-11 (×3): qty 1

## 2018-12-11 MED ORDER — ACETAMINOPHEN 500 MG PO TABS: 1000.0000 mg | ORAL_TABLET | Freq: Once | ORAL | Status: DC

## 2018-12-11 MED ORDER — ONDANSETRON HCL 4 MG/2ML IJ SOLN
INTRAMUSCULAR | Status: DC | PRN
  Administered 2018-12-11: 4 mg via INTRAVENOUS

## 2018-12-11 MED ORDER — LIDOCAINE 2% (20 MG/ML) 5 ML SYRINGE
INTRAMUSCULAR | Status: DC | PRN
  Administered 2018-12-11: 100 mg via INTRAVENOUS

## 2018-12-11 MED ORDER — FENTANYL CITRATE (PF) 250 MCG/5ML IJ SOLN
INTRAMUSCULAR | Status: AC
  Filled 2018-12-11: qty 5

## 2018-12-11 MED ORDER — ONDANSETRON HCL 4 MG PO TABS
4.0000 mg | ORAL_TABLET | ORAL | Status: DC | PRN
  Administered 2018-12-13: 17:00:00 4 mg via ORAL
  Filled 2018-12-11: qty 1

## 2018-12-11 MED ORDER — MUPIROCIN 2 % EX OINT
1.0000 "application " | TOPICAL_OINTMENT | Freq: Two times a day (BID) | CUTANEOUS | Status: DC
  Administered 2018-12-11 – 2018-12-14 (×6): 1 via NASAL
  Filled 2018-12-11 (×2): qty 22

## 2018-12-11 MED ORDER — MAGNESIUM CITRATE PO SOLN: 1.0000 | Freq: Once | ORAL | Status: DC | PRN

## 2018-12-11 MED ORDER — ARTIFICIAL TEARS OPHTHALMIC OINT
TOPICAL_OINTMENT | OPHTHALMIC | Status: DC | PRN
  Administered 2018-12-11: 1 via OPHTHALMIC

## 2018-12-11 MED ORDER — THROMBIN 5000 UNITS EX SOLR
CUTANEOUS | Status: AC
  Filled 2018-12-11: qty 5000

## 2018-12-11 MED ORDER — VANCOMYCIN HCL IN DEXTROSE 1-5 GM/200ML-% IV SOLN
1000.0000 mg | INTRAVENOUS | Status: AC
  Administered 2018-12-11: 10:00:00 1000 mg via INTRAVENOUS

## 2018-12-11 MED ORDER — LACTATED RINGERS IV SOLN
INTRAVENOUS | Status: DC
  Administered 2018-12-11: 10:00:00 via INTRAVENOUS

## 2018-12-11 MED ORDER — NALOXONE HCL 0.4 MG/ML IJ SOLN: 0.0800 mg | INTRAMUSCULAR | Status: DC | PRN

## 2018-12-11 MED ORDER — MIDAZOLAM HCL 2 MG/2ML IJ SOLN
INTRAMUSCULAR | Status: AC
  Filled 2018-12-11: qty 2

## 2018-12-11 MED ORDER — HYDROMORPHONE HCL 1 MG/ML IJ SOLN
0.5000 mg | INTRAMUSCULAR | Status: DC | PRN
  Administered 2018-12-11 – 2018-12-12 (×4): 1 mg via INTRAVENOUS
  Filled 2018-12-11 (×4): qty 1

## 2018-12-11 MED ORDER — PHENYLEPHRINE 40 MCG/ML (10ML) SYRINGE FOR IV PUSH (FOR BLOOD PRESSURE SUPPORT)
PREFILLED_SYRINGE | INTRAVENOUS | Status: DC | PRN
  Administered 2018-12-11: 80 ug via INTRAVENOUS
  Administered 2018-12-11: 120 ug via INTRAVENOUS
  Administered 2018-12-11: 40 ug via INTRAVENOUS
  Administered 2018-12-11 (×3): 80 ug via INTRAVENOUS

## 2018-12-11 MED ORDER — DIPHENHYDRAMINE HCL 50 MG/ML IJ SOLN
INTRAMUSCULAR | Status: DC | PRN
  Administered 2018-12-11: 12.5 mg via INTRAVENOUS

## 2018-12-11 MED ORDER — VENLAFAXINE HCL 37.5 MG PO TABS
37.5000 mg | ORAL_TABLET | Freq: Two times a day (BID) | ORAL | Status: DC
  Administered 2018-12-12 – 2018-12-14 (×5): 37.5 mg via ORAL
  Filled 2018-12-11 (×7): qty 1

## 2018-12-11 MED ORDER — SUCCINYLCHOLINE CHLORIDE 200 MG/10ML IV SOSY
PREFILLED_SYRINGE | INTRAVENOUS | Status: DC | PRN
  Administered 2018-12-11: 120 mg via INTRAVENOUS

## 2018-12-11 MED ORDER — FENTANYL CITRATE (PF) 100 MCG/2ML IJ SOLN
INTRAMUSCULAR | Status: AC
  Filled 2018-12-11: qty 2

## 2018-12-11 MED ORDER — POLYETHYLENE GLYCOL 3350 17 G PO PACK: 17.0000 g | PACK | Freq: Every day | ORAL | Status: DC | PRN

## 2018-12-11 MED ORDER — LEVETIRACETAM IN NACL 500 MG/100ML IV SOLN
500.0000 mg | Freq: Two times a day (BID) | INTRAVENOUS | Status: DC
  Administered 2018-12-11 – 2018-12-13 (×4): 500 mg via INTRAVENOUS
  Filled 2018-12-11 (×4): qty 100

## 2018-12-11 MED ORDER — MIDAZOLAM HCL 5 MG/5ML IJ SOLN
INTRAMUSCULAR | Status: DC | PRN
  Administered 2018-12-11 (×2): 1 mg via INTRAVENOUS

## 2018-12-11 MED ORDER — SODIUM CHLORIDE 0.9 % IV SOLN
INTRAVENOUS | Status: DC | PRN
  Administered 2018-12-11: 25 ug/min via INTRAVENOUS

## 2018-12-11 MED ORDER — POTASSIUM CHLORIDE CRYS ER 20 MEQ PO TBCR
20.0000 meq | EXTENDED_RELEASE_TABLET | Freq: Two times a day (BID) | ORAL | Status: DC
  Administered 2018-12-12 – 2018-12-14 (×5): 20 meq via ORAL
  Filled 2018-12-11 (×5): qty 1

## 2018-12-11 MED ORDER — THROMBIN 5000 UNITS EX SOLR
OROMUCOSAL | Status: DC | PRN
  Administered 2018-12-11: 5 mL via TOPICAL

## 2018-12-11 MED ORDER — BACITRACIN ZINC 500 UNIT/GM EX OINT
TOPICAL_OINTMENT | CUTANEOUS | Status: DC | PRN
  Administered 2018-12-11: 1 via TOPICAL

## 2018-12-11 MED ORDER — THROMBIN 20000 UNITS EX SOLR
CUTANEOUS | Status: AC
  Filled 2018-12-11: qty 20000

## 2018-12-11 MED ORDER — SCOPOLAMINE 1 MG/3DAYS TD PT72
1.0000 | MEDICATED_PATCH | TRANSDERMAL | Status: DC
  Administered 2018-12-11: 10:00:00 1.5 mg via TRANSDERMAL
  Filled 2018-12-11: qty 1

## 2018-12-11 MED ORDER — PROPOFOL 10 MG/ML IV BOLUS
INTRAVENOUS | Status: DC | PRN
  Administered 2018-12-11: 50 mg via INTRAVENOUS
  Administered 2018-12-11: 150 mg via INTRAVENOUS

## 2018-12-11 MED ORDER — VANCOMYCIN HCL IN DEXTROSE 750-5 MG/150ML-% IV SOLN
750.0000 mg | Freq: Two times a day (BID) | INTRAVENOUS | Status: AC
  Administered 2018-12-11 – 2018-12-12 (×2): 750 mg via INTRAVENOUS
  Filled 2018-12-11 (×4): qty 150

## 2018-12-11 MED ORDER — PROMETHAZINE HCL 25 MG/ML IJ SOLN
12.5000 mg | INTRAMUSCULAR | Status: DC | PRN
  Administered 2018-12-11 (×2): 25 mg via INTRAVENOUS
  Administered 2018-12-12 (×4): 12.5 mg via INTRAVENOUS
  Filled 2018-12-11 (×6): qty 1

## 2018-12-11 MED ORDER — PROMETHAZINE HCL 25 MG PO TABS
12.5000 mg | ORAL_TABLET | ORAL | Status: DC | PRN
  Filled 2018-12-11: qty 1

## 2018-12-11 MED ORDER — BACITRACIN ZINC 500 UNIT/GM EX OINT
TOPICAL_OINTMENT | CUTANEOUS | Status: AC
  Filled 2018-12-11: qty 28.35

## 2018-12-11 MED ORDER — VERAPAMIL HCL ER 240 MG PO TBCR
240.0000 mg | EXTENDED_RELEASE_TABLET | Freq: Every day | ORAL | Status: DC
  Administered 2018-12-12 – 2018-12-13 (×2): 240 mg via ORAL
  Filled 2018-12-11 (×4): qty 1

## 2018-12-11 MED ORDER — ACETAMINOPHEN 650 MG RE SUPP: 650.0000 mg | RECTAL | Status: DC | PRN

## 2018-12-11 MED ORDER — PROMETHAZINE HCL 25 MG/ML IJ SOLN
INTRAMUSCULAR | Status: AC
  Filled 2018-12-11: qty 1

## 2018-12-11 MED ORDER — BISACODYL 10 MG RE SUPP: 10.0000 mg | Freq: Every day | RECTAL | Status: DC | PRN

## 2018-12-11 MED ORDER — PANTOPRAZOLE SODIUM 40 MG IV SOLR
40.0000 mg | Freq: Every day | INTRAVENOUS | Status: DC
  Administered 2018-12-11 – 2018-12-12 (×2): 40 mg via INTRAVENOUS
  Filled 2018-12-11 (×2): qty 40

## 2018-12-11 MED ORDER — ACETAMINOPHEN 500 MG PO TABS
500.0000 mg | ORAL_TABLET | Freq: Once | ORAL | Status: AC
  Administered 2018-12-11: 500 mg via ORAL
  Filled 2018-12-11: qty 1

## 2018-12-11 MED ORDER — VANCOMYCIN HCL IN DEXTROSE 1-5 GM/200ML-% IV SOLN
INTRAVENOUS | Status: AC
  Filled 2018-12-11: qty 200

## 2018-12-11 MED ORDER — ROCURONIUM BROMIDE 100 MG/10ML IV SOLN
INTRAVENOUS | Status: DC | PRN
  Administered 2018-12-11: 20 mg via INTRAVENOUS
  Administered 2018-12-11: 10 mg via INTRAVENOUS
  Administered 2018-12-11: 50 mg via INTRAVENOUS

## 2018-12-11 MED ORDER — SUGAMMADEX SODIUM 200 MG/2ML IV SOLN
INTRAVENOUS | Status: DC | PRN
  Administered 2018-12-11: 20 mg via INTRAVENOUS

## 2018-12-11 MED ORDER — FENTANYL CITRATE (PF) 100 MCG/2ML IJ SOLN
INTRAMUSCULAR | Status: DC | PRN
  Administered 2018-12-11 (×2): 50 ug via INTRAVENOUS
  Administered 2018-12-11: 100 ug via INTRAVENOUS

## 2018-12-11 MED ORDER — HYDROCODONE-ACETAMINOPHEN 5-325 MG PO TABS
1.0000 | ORAL_TABLET | ORAL | Status: DC | PRN
  Administered 2018-12-12: 1 via ORAL
  Filled 2018-12-11 (×2): qty 1

## 2018-12-11 MED ORDER — CLONAZEPAM 0.5 MG PO TABS
0.5000 mg | ORAL_TABLET | Freq: Every day | ORAL | Status: DC
  Administered 2018-12-12 – 2018-12-13 (×3): 0.5 mg via ORAL
  Filled 2018-12-11 (×3): qty 1

## 2018-12-11 MED ORDER — DEXAMETHASONE SODIUM PHOSPHATE 10 MG/ML IJ SOLN
INTRAMUSCULAR | Status: DC | PRN
  Administered 2018-12-11: 10 mg via INTRAVENOUS

## 2018-12-11 MED ORDER — CHLORHEXIDINE GLUCONATE CLOTH 2 % EX PADS
6.0000 | MEDICATED_PAD | Freq: Every day | CUTANEOUS | Status: DC
  Administered 2018-12-11 – 2018-12-12 (×2): 6 via TOPICAL

## 2018-12-11 SURGICAL SUPPLY — 73 items
ADH SKN CLS APL DERMABOND .7 (GAUZE/BANDAGES/DRESSINGS) ×1
APL SKNCLS STERI-STRIP NONHPOA (GAUZE/BANDAGES/DRESSINGS)
BENZOIN TINCTURE PRP APPL 2/3 (GAUZE/BANDAGES/DRESSINGS) IMPLANT
BLADE CLIPPER SURG (BLADE) ×3 IMPLANT
BLADE SURG 11 STRL SS (BLADE) ×2 IMPLANT
BLADE ULTRA TIP 2M (BLADE) IMPLANT
BUR ACORN 6.0 PRECISION (BURR) ×2 IMPLANT
BUR PRECISION FLUTE 5.0 (BURR) ×2 IMPLANT
CANISTER SUCT 3000ML PPV (MISCELLANEOUS) ×2 IMPLANT
CARTRIDGE OIL MAESTRO DRILL (MISCELLANEOUS) ×1 IMPLANT
CLIP VESOCCLUDE MED 6/CT (CLIP) IMPLANT
COVER BACK TABLE 60X90IN (DRAPES) IMPLANT
COVER WAND RF STERILE (DRAPES) ×1 IMPLANT
DERMABOND ADVANCED (GAUZE/BANDAGES/DRESSINGS) ×1
DERMABOND ADVANCED .7 DNX12 (GAUZE/BANDAGES/DRESSINGS) IMPLANT
DIFFUSER DRILL AIR PNEUMATIC (MISCELLANEOUS) ×2 IMPLANT
DRAPE LAPAROTOMY 100X72 PEDS (DRAPES) ×2 IMPLANT
DRAPE MICROSCOPE LEICA (MISCELLANEOUS) IMPLANT
DRAPE WARM FLUID 44X44 (DRAPES) ×2 IMPLANT
DRSG OPSITE POSTOP 4X6 (GAUZE/BANDAGES/DRESSINGS) ×1 IMPLANT
DURAPREP 6ML APPLICATOR 50/CS (WOUND CARE) ×2 IMPLANT
ELECT REM PT RETURN 9FT ADLT (ELECTROSURGICAL) ×2
ELECTRODE REM PT RTRN 9FT ADLT (ELECTROSURGICAL) ×1 IMPLANT
EVACUATOR 1/8 PVC DRAIN (DRAIN) IMPLANT
EVACUATOR SILICONE 100CC (DRAIN) IMPLANT
GAUZE 4X4 16PLY RFD (DISPOSABLE) IMPLANT
GAUZE SPONGE 4X4 12PLY STRL (GAUZE/BANDAGES/DRESSINGS) IMPLANT
GLOVE BIO SURGEON STRL SZ 6.5 (GLOVE) ×1 IMPLANT
GLOVE BIO SURGEON STRL SZ7 (GLOVE) ×2 IMPLANT
GLOVE BIO SURGEON STRL SZ7.5 (GLOVE) ×3 IMPLANT
GLOVE BIOGEL PI IND STRL 6.5 (GLOVE) IMPLANT
GLOVE BIOGEL PI IND STRL 7.0 (GLOVE) IMPLANT
GLOVE BIOGEL PI IND STRL 7.5 (GLOVE) IMPLANT
GLOVE BIOGEL PI INDICATOR 6.5 (GLOVE) ×2
GLOVE BIOGEL PI INDICATOR 7.0 (GLOVE)
GLOVE BIOGEL PI INDICATOR 7.5 (GLOVE) ×3
GLOVE ECLIPSE 7.5 STRL STRAW (GLOVE) ×2 IMPLANT
GLOVE SURG SS PI 6.0 STRL IVOR (GLOVE) ×1 IMPLANT
GOWN STRL REUS W/ TWL LRG LVL3 (GOWN DISPOSABLE) ×1 IMPLANT
GOWN STRL REUS W/ TWL XL LVL3 (GOWN DISPOSABLE) IMPLANT
GOWN STRL REUS W/TWL 2XL LVL3 (GOWN DISPOSABLE) IMPLANT
GOWN STRL REUS W/TWL LRG LVL3 (GOWN DISPOSABLE) ×10
GOWN STRL REUS W/TWL XL LVL3 (GOWN DISPOSABLE)
GRAFT PERICARIUM DURAL BV 4X5 (Graft) ×1 IMPLANT
HEMOSTAT POWDER KIT SURGIFOAM (HEMOSTASIS) ×2 IMPLANT
HEMOSTAT SURGICEL 2X14 (HEMOSTASIS) IMPLANT
KIT BASIN OR (CUSTOM PROCEDURE TRAY) ×2 IMPLANT
KIT TURNOVER KIT B (KITS) ×2 IMPLANT
NDL HYPO 25X1 1.5 SAFETY (NEEDLE) ×1 IMPLANT
NEEDLE HYPO 22GX1.5 SAFETY (NEEDLE) ×1 IMPLANT
NEEDLE HYPO 25X1 1.5 SAFETY (NEEDLE) IMPLANT
NS IRRIG 1000ML POUR BTL (IV SOLUTION) ×3 IMPLANT
OIL CARTRIDGE MAESTRO DRILL (MISCELLANEOUS) ×2
PACK CRANIOTOMY CUSTOM (CUSTOM PROCEDURE TRAY) ×2 IMPLANT
PAD ARMBOARD 7.5X6 YLW CONV (MISCELLANEOUS) ×8 IMPLANT
PATTIES SURGICAL .5 X1 (DISPOSABLE) IMPLANT
PATTIES SURGICAL 1/4 X 3 (GAUZE/BANDAGES/DRESSINGS) IMPLANT
RUBBERBAND STERILE (MISCELLANEOUS) ×2 IMPLANT
SEALANT ADHERUS EXTEND TIP (MISCELLANEOUS) ×1 IMPLANT
SPONGE LAP 4X18 RFD (DISPOSABLE) IMPLANT
SPONGE SURGIFOAM ABS GEL 100 (HEMOSTASIS) ×2 IMPLANT
STAPLER SKIN PROX WIDE 3.9 (STAPLE) ×1 IMPLANT
SUT ETHILON 3 0 FSL (SUTURE) IMPLANT
SUT NURALON 4 0 TR CR/8 (SUTURE) ×1 IMPLANT
SUT PROLENE 6 0 BV (SUTURE) ×6 IMPLANT
SUT VIC AB 0 CT1 18XCR BRD8 (SUTURE) ×2 IMPLANT
SUT VIC AB 0 CT1 8-18 (SUTURE) ×6
SUT VICRYL 3-0 RB1 18 ABS (SUTURE) ×3 IMPLANT
TOWEL GREEN STERILE (TOWEL DISPOSABLE) ×2 IMPLANT
TOWEL GREEN STERILE FF (TOWEL DISPOSABLE) ×1 IMPLANT
TRAY FOLEY MTR SLVR 16FR STAT (SET/KITS/TRAYS/PACK) ×1 IMPLANT
UNDERPAD 30X30 (UNDERPADS AND DIAPERS) IMPLANT
WATER STERILE IRR 1000ML POUR (IV SOLUTION) ×2 IMPLANT

## 2018-12-11 NOTE — Op Note (Signed)
°  NEUROSURGERY OPERATIVE NOTE   PREOP DIAGNOSIS:  1. Chiari I Malformation   POSTOP DIAGNOSIS: Same  PROCEDURE: 1. Suboccipital craniectomy with complete C1 laminectomy 2. Expansile Duraplasty  SURGEON: Dr. Lisbeth Renshaw, MD  ASSISTANT: Cindra Presume, PA-C  ANESTHESIA: General Endotracheal  EBL: 75cc  SPECIMENS: None  DRAINS: None  COMPLICATIONS: None immediate  CONDITION: Hemodynamically stable to PACU  HISTORY: [name] is a 63 y.o. female initially seen in the outpatient neurosurgery clinic with Valsalva related headache. Workup included MRI which demonstrated Chiari I malformation with tonsillar descent of approximately 12 mm.  There was also associated crowding of the foramen magnum and compression of the cervical medullary junction on the right side.  With these findings, surgical decompression was indicated. Treatment options were discussed, and the patient elected to proceed with surgical decompression. The risks and benefits of the surgery were explained in detail. After all questions were answered, informed consent was obtained.  PROCEDURE IN DETAIL: After informed consent was obtained and witnessed, the patient was brought to the operating room. After induction of general anesthesia, the Mayfield head holder was applied to the patient, and she was positioned on the operative table in the prone position. All pressure points were then meticulously padded. The skin of the occiput was then clipped prepped and draped in the usual sterile fashion.  After timeout was conducted, skin incision was infiltrated with local and aesthetic with epinephrine.  Midline skin incision was then made sharply, and Bovie electrocautery was used to dissect through subcutaneous tissue until the nuchal fascia was identified. This was then incised in a Y-shaped fashion based superiorly. The nuchal musculature was then divided in the avascular midline plane, and the occipital bone, foramen  magnum, and lamina of C1 were identified and dissected in the subperiosteal plane. Self-retaining retractor was then placed.  Utilizing a high-speed drill, suboccipital craniectomy was completed, and extended laterally until the entire width of the foramen magnum was opened, and the lateral edge of the foramen magnum was palpable. C1 laminectomy was then completed utilizing the high-speed drill and Kerrison rongeurs.  The atlantooccipital membrane was then coagulated and divided sharply.  It was then dissected away from the dura.  The posterior fossa dura was opened in a Y-shaped fashion along the cerebellar tonsils, and in the midline down to the C1-C2 junction. The cerebellar tonsils were identified extending to a level just inferior to the C1 lamina.  Midline and lateral arachnoid lesions were lysed using microscissors.  After intradural hemostasis was confirmed, attention was turned to expansile duraplasty. A sewable piece of DuraGen was selected, cut to size, and using a combination of continuous 4-0 Nurolon stitches, this was sewed in a watertight fashion. The dural graft and suture line was then covered with a layer of Adherus dural sealant.  At this point the wound was irrigated with copious amounts of normal saline irrigation. Good hemostasis was confirmed. The wound was then closed in multiple layers using a combination of 0 and 3-0 by stitches. Skin was then closed with Dermabond. The patient was then transferred to the stretcher, the Mayfield head holder removed, she was extubated, and taken to the postanesthesia care unit in stable hemodynamic condition.  At the end of the case all sponge, needle, and instrument counts were correct.

## 2018-12-11 NOTE — Progress Notes (Signed)
   12/11/18 1720  Vitals  Temp 98.3 F (36.8 C)  Temp Source Oral  BP (!) 149/95  MAP (mmHg) 111  ECG Heart Rate 99  Resp 14  Oxygen Therapy  SpO2 96 %  O2 Device Nasal Cannula  O2 Flow Rate (L/min) 2 L/min  Pain Assessment  Pain Scale 0-10  Pain Score 9  Pain Type Surgical pain;Acute pain  Pain Location Head  Pain Orientation Posterior  Pain Radiating Towards posterior neck  Pain Descriptors / Indicators Spasm;Tightness;Throbbing  Pain Frequency Constant  Pain Onset On-going  Pain Intervention(s) Medication (See eMAR)  Response to Interventions pt had epidose vomitus immediately after dilaudid admin  Glasgow Coma Scale  Eye Opening 3  Best Verbal Response (NON-intubated) 5  Best Motor Response 6  Glasgow Coma Scale Score 14  Height and Weight  Height 5\' 4"  (1.626 m)  Weight 82.4 kg  Type of Scale Used Bed  BSA (Calculated - sq m) 1.93 sq meters  BMI (Calculated) 31.17  Weight in (lb) to have BMI = 25 145.3  Level of Consciousness  Level of Consciousness Responds to Voice  MEWS Score  MEWS RR 0  MEWS Pulse 0  MEWS Systolic 0  MEWS LOC 1  MEWS Temp 0  MEWS Score 1  MEWS Score Color Green  Pt arrived to 4N17 at 1720. Posterior neck surgical site covered with honeycomb dressing with old drainage.  Pt c/o pain, muscle tightness, and nausea.  Pt vomited immediately after Dilaudid admin.

## 2018-12-11 NOTE — Anesthesia Preprocedure Evaluation (Signed)
Anesthesia Evaluation  Patient identified by MRN, date of birth, ID band Patient awake    Reviewed: Allergy & Precautions, NPO status , Patient's Chart, lab work & pertinent test results  History of Anesthesia Complications (+) PONV and history of anesthetic complications  Airway Mallampati: II  TM Distance: >3 FB Neck ROM: Full    Dental no notable dental hx. (+) Dental Advisory Given   Pulmonary neg pulmonary ROS, former smoker,    Pulmonary exam normal        Cardiovascular hypertension, Pt. on medications negative cardio ROS Normal cardiovascular exam     Neuro/Psych PSYCHIATRIC DISORDERS Depression negative neurological ROS     GI/Hepatic negative GI ROS, Neg liver ROS,   Endo/Other  negative endocrine ROS  Renal/GU Renal disease  negative genitourinary   Musculoskeletal  (+) Fibromyalgia -  Abdominal   Peds negative pediatric ROS (+)  Hematology negative hematology ROS (+)   Anesthesia Other Findings   Reproductive/Obstetrics negative OB ROS                             Anesthesia Physical Anesthesia Plan  ASA: III  Anesthesia Plan: General   Post-op Pain Management:    Induction: Intravenous  PONV Risk Score and Plan: 4 or greater and Ondansetron, Dexamethasone, Scopolamine patch - Pre-op and Diphenhydramine  Airway Management Planned: Oral ETT  Additional Equipment:   Intra-op Plan:   Post-operative Plan: Extubation in OR  Informed Consent: I have reviewed the patients History and Physical, chart, labs and discussed the procedure including the risks, benefits and alternatives for the proposed anesthesia with the patient or authorized representative who has indicated his/her understanding and acceptance.     Dental advisory given  Plan Discussed with: CRNA and Anesthesiologist  Anesthesia Plan Comments:         Anesthesia Quick Evaluation

## 2018-12-11 NOTE — Progress Notes (Addendum)
Pharmacy Antibiotic Note  [name] is a 63 y.o. female admitted on 12/11/2018 with chairi malformation, now s/p craniectomy and laminectomy. Pharmacy has been consulted for vancomycin dosing x 24 hours for surgical prophylaxis.  Patient has itching and hives with PCN.  Noted he received vancomycin 1gm around 1000 today.  6/1 labs - SCr 1.22, CrCL 50 ml/min, WBC WNL.  Plan: Vanc 750mg  IV Q12H x 2 doses Pharmacy will sign off.  Height: 5\' 4"  (162.6 cm) Weight: 181 lb 11.2 oz (82.4 kg) IBW/kg (Calculated) : 54.7  Temp (24hrs), Avg:98.3 F (36.8 C), Min:97.9 F (36.6 C), Max:98.7 F (37.1 C)  Recent Labs  Lab 12/07/18 1124  WBC 6.6  CREATININE 1.22*    Estimated Creatinine Clearance: 50.3 mL/min (A) (by C-G formula based on SCr of 1.22 mg/dL (H)).    Allergies  Allergen Reactions  . Codeine Sulfate Hives and Itching  . Moxifloxacin Hives and Itching  . Penicillins Hives and Itching    Tolerates cephalosporins Did it involve swelling of the face/tongue/throat, SOB, or low BP? No Did it involve sudden or severe rash/hives, skin peeling, or any reaction on the inside of your mouth or nose? No Did you need to seek medical attention at a hospital or doctor's office? No When did it last happen? If all above answers are "NO", may proceed with cephalosporin use.   . Sulfonamide Derivatives Hives and Rash  . Biaxin [Clarithromycin] Nausea Only    Metallic taste  . Diphenhydramine Hcl Other (See Comments)    side effect of being irritable  . Tramadol Hcl Nausea And Vomiting    "Doesn't agree with my system." Does not help pain and causes vomiting.    [name] D. Laney Potash, PharmD, BCPS, BCCCP 12/11/2018, 5:14 PM

## 2018-12-11 NOTE — Anesthesia Postprocedure Evaluation (Signed)
Anesthesia Post Note  Patient: [name]  Procedure(s) Performed: Chiari Decompression (N/A )     Patient location during evaluation: PACU Anesthesia Type: General Level of consciousness: sedated Pain management: pain level controlled Vital Signs Assessment: post-procedure vital signs reviewed and stable Respiratory status: spontaneous breathing and respiratory function stable Cardiovascular status: stable Postop Assessment: no apparent nausea or vomiting Anesthetic complications: no    Last Vitals:  Vitals:   12/11/18 1455 12/11/18 1508  BP: 125/81 136/85  Pulse: 94 92  Resp: 12 13  Temp: 36.6 C   SpO2: 95% 94%    Last Pain:  Vitals:   12/11/18 1455  TempSrc:   PainSc: Asleep                 Jef Futch DANIEL

## 2018-12-11 NOTE — Anesthesia Procedure Notes (Signed)
Arterial Line Insertion Start/End6/11/2018 11:38 AM Performed by: Rosalio Macadamia, CRNA, CRNA  Patient location: Pre-op. Preanesthetic checklist: patient identified, IV checked, site marked, risks and benefits discussed, surgical consent, monitors and equipment checked, pre-op evaluation, timeout performed and anesthesia consent Lidocaine 1% used for infiltration Left, radial was placed Catheter size: 20 G Hand hygiene performed  and maximum sterile barriers used   Attempts: 1 Procedure performed without using ultrasound guided technique. Following insertion, dressing applied and Biopatch. Post procedure assessment: normal and unchanged  Patient tolerated the procedure well with no immediate complications.

## 2018-12-11 NOTE — Anesthesia Procedure Notes (Signed)
Procedure Name: Intubation Date/Time: 12/11/2018 11:32 AM Performed by: Candis Shine, CRNA Pre-anesthesia Checklist: Patient identified, Emergency Drugs available, Suction available and Patient being monitored Patient Re-evaluated:Patient Re-evaluated prior to induction Oxygen Delivery Method: Circle System Utilized Preoxygenation: Pre-oxygenation with 100% oxygen Induction Type: IV induction and Rapid sequence Ventilation: Mask ventilation without difficulty Laryngoscope Size: Mac and 3 Grade View: Grade II Tube type: Oral Tube size: 7.0 mm Number of attempts: 1 Airway Equipment and Method: Stylet Placement Confirmation: ETT inserted through vocal cords under direct vision,  positive ETCO2 and breath sounds checked- equal and bilateral Secured at: 22 cm Tube secured with: Tape Dental Injury: Teeth and Oropharynx as per pre-operative assessment

## 2018-12-11 NOTE — Transfer of Care (Signed)
Immediate Anesthesia Transfer of Care Note  Patient: [name]  Procedure(s) Performed: Chiari Decompression (N/A )  Patient Location: PACU  Anesthesia Type:General  Level of Consciousness: awake, alert  and oriented  Airway & Oxygen Therapy: Patient Spontanous Breathing and Patient connected to nasal cannula oxygen  Post-op Assessment: Report given to RN and Post -op Vital signs reviewed and stable  Post vital signs: Reviewed and stable  Last Vitals:  Vitals Value Taken Time  BP 125/81 12/11/2018  2:55 PM  Temp 36.6 C 12/11/2018  2:55 PM  Pulse 95 12/11/2018  2:57 PM  Resp 12 12/11/2018  2:57 PM  SpO2 96 % 12/11/2018  2:57 PM  Vitals shown include unvalidated device data.  Last Pain:  Vitals:   12/11/18 0949  TempSrc:   PainSc: 0-No pain      Patients Stated Pain Goal: 3 (12/11/18 0949)  Complications: No apparent anesthesia complications

## 2018-12-12 ENCOUNTER — Inpatient Hospital Stay (HOSPITAL_COMMUNITY): Payer: 59

## 2018-12-12 LAB — BASIC METABOLIC PANEL
Anion gap: 9 (ref 5–15)
BUN: 12 mg/dL (ref 8–23)
CO2: 22 mmol/L (ref 22–32)
Calcium: 8.7 mg/dL — ABNORMAL LOW (ref 8.9–10.3)
Chloride: 108 mmol/L (ref 98–111)
Creatinine, Ser: 1.09 mg/dL — ABNORMAL HIGH (ref 0.44–1.00)
GFR calc Af Amer: >60 mL/min (ref >60)
GFR calc non Af Amer: 55 mL/min — ABNORMAL LOW (ref >60)
Glucose, Bld: 159 mg/dL — ABNORMAL HIGH (ref 70–99)
Potassium: 4.2 mmol/L (ref 3.5–5.1)
Sodium: 139 mmol/L (ref 135–145)

## 2018-12-12 LAB — CBC
HCT: 40.1 % (ref 36.0–46.0)
Hemoglobin: 12.9 g/dL (ref 12.0–15.0)
MCH: 29.3 pg (ref 26.0–34.0)
MCHC: 32.2 g/dL (ref 30.0–36.0)
MCV: 91.1 fL (ref 80.0–100.0)
Platelets: 290 10*3/uL (ref 150–400)
RBC: 4.4 MIL/uL (ref 3.87–5.11)
RDW: 12.3 % (ref 11.5–15.5)
WBC: 15.9 10*3/uL — ABNORMAL HIGH (ref 4.0–10.5)
nRBC: 0 % (ref 0.0–0.2)

## 2018-12-12 MED ORDER — HYDROCODONE-ACETAMINOPHEN 5-325 MG PO TABS
1.0000 | ORAL_TABLET | ORAL | Status: DC | PRN
  Administered 2018-12-12 – 2018-12-14 (×9): 2 via ORAL
  Filled 2018-12-12 (×9): qty 2

## 2018-12-12 MED ORDER — ORAL CARE MOUTH RINSE: 15.0000 mL | Freq: Two times a day (BID) | OROMUCOSAL | Status: DC

## 2018-12-12 MED ORDER — MORPHINE SULFATE (PF) 2 MG/ML IV SOLN
2.0000 mg | INTRAVENOUS | Status: DC | PRN
  Administered 2018-12-12: 2 mg via INTRAVENOUS
  Filled 2018-12-12: qty 1

## 2018-12-12 MED ORDER — DEXAMETHASONE SODIUM PHOSPHATE 4 MG/ML IJ SOLN
4.0000 mg | Freq: Four times a day (QID) | INTRAMUSCULAR | Status: AC
  Administered 2018-12-12 – 2018-12-13 (×4): 4 mg via INTRAVENOUS
  Filled 2018-12-12 (×4): qty 1

## 2018-12-12 MED ORDER — ORAL CARE MOUTH RINSE
15.0000 mL | Freq: Two times a day (BID) | OROMUCOSAL | Status: DC
  Administered 2018-12-12 (×2): 15 mL via OROMUCOSAL

## 2018-12-12 NOTE — Progress Notes (Signed)
Upon 0200 assessment, patient presenting more lethargic, slurred/garbled speech with intermittent tracking issues. Some Delayed and nonsensical responses when asked about pain. Patient Oriented x4, but requires multiple repetition of questions.   Neurosurgery Notified, STAT CT ordered and performed.   Will continue to monitor patient.

## 2018-12-12 NOTE — Progress Notes (Signed)
Patient ID: [name], female   DOB: 01-Feb-1956, 63 y.o.   MRN: [id number] Unfortunately she is in a lot of discomfort.  Much of is it in her low back.  She feels this is related to her fibromyalgia.  This is not a new pain for her but just seems more significant.  She also has appropriate neck soreness in the eye seems to be helping.  Dilaudid makes her vomit.  The Norco helps somewhat.  No arm pain or numbness tingling weakness or visual changes or double vision.  Will try morphine instead of Dilaudid.  Add Decadron as this may help.  Mobilize today as tolerated.

## 2018-12-12 NOTE — Progress Notes (Signed)
Patients symptoms resolved upon 0400 assessment. Spoke with neurosurgery on call- If symptoms worsen or become evident again, place patient flat and apply non-rebreather.   Will continue to monitor.

## 2018-12-12 NOTE — Progress Notes (Signed)
Patient experiencing severe Nausea with episodes of vomitus frequently. Patient reports HX of sensitivity to anesthesia. Zofran administration attempted without relief.  Pharmacy to change order for Phenergan from PO to IV.  Will continue to monitor patient.

## 2018-12-12 NOTE — Progress Notes (Signed)
Patient continuing to experience episodes of nausea and vomiting. Relief with IV phenergan lasting approximately 3 hours. Patient Alert and Oriented x4.   Will continue to monitor.

## 2018-12-13 MED ORDER — SODIUM CHLORIDE 0.9 % IV SOLN: INTRAVENOUS | Status: DC

## 2018-12-13 MED ORDER — PANTOPRAZOLE SODIUM 40 MG PO TBEC
40.0000 mg | DELAYED_RELEASE_TABLET | Freq: Every day | ORAL | Status: DC
  Administered 2018-12-13: 40 mg via ORAL
  Filled 2018-12-13: qty 1

## 2018-12-13 NOTE — Progress Notes (Signed)
Patient ID: [name], female   DOB: 10/01/1955, 63 y.o.   MRN: [id number] Subjective: Patient reports she feels much better.  Still some mild neck aching.  Back much better.  No arm pain or numbness tingling or weakness.  Objective: Vital signs in last 24 hours: Temp:  [97.7 F (36.5 C)-100.2 F (37.9 C)] 98.1 F (36.7 C) (06/07 0400) Pulse Rate:  [62-120] 62 (06/07 0701) Resp:  [8-31] 16 (06/07 0701) BP: (92-172)/(60-111) 100/64 (06/07 0701) SpO2:  [87 %-100 %] 94 % (06/07 0701)  Intake/Output from previous day: 06/06 0701 - 06/07 0700 In: 1910.4 [I.V.:1560.4; IV Piggyback:350] Out: -  Intake/Output this shift: No intake/output data recorded.  She is awake and alert, pleasant and interactive, moves all extremities.  Dressing is dry.  Out of bed to chair eating today.  Lab Results: Lab Results  Component Value Date   WBC 15.9 (H) 12/12/2018   HGB 12.9 12/12/2018   HCT 40.1 12/12/2018   MCV 91.1 12/12/2018   PLT 290 12/12/2018   No results found for: INR, PROTIME BMET Lab Results  Component Value Date   NA 139 12/12/2018   K 4.2 12/12/2018   CL 108 12/12/2018   CO2 22 12/12/2018   GLUCOSE 159 (H) 12/12/2018   BUN 12 12/12/2018   CREATININE 1.09 (H) 12/12/2018   CALCIUM 8.7 (L) 12/12/2018    Studies/Results: Ct Head Wo Contrast  Result Date: 12/12/2018 CLINICAL DATA:  Altered mental status EXAM: CT HEAD WITHOUT CONTRAST TECHNIQUE: Contiguous axial images were obtained from the base of the skull through the vertex without intravenous contrast. COMPARISON:  Head CT 11/20/2018 FINDINGS: Brain: Scattered pneumocephalus, status post suboccipital craniectomy and C1 laminectomy. No intracranial hemorrhage or extra-axial collection. Brain parenchyma is normal. Vascular: No abnormal hyperdensity of the major intracranial arteries or dural venous sinuses. No intracranial atherosclerosis. Skull: Status post suboccipital craniectomy and bilateral C1 laminectomy. Sinuses/Orbits:  Opacification of the right frontal sinus. No mastoid effusion. The orbits are normal. IMPRESSION: Status post decompressive suboccipital craniectomy and C1 laminectomy for Chiari malformation. Small volume pneumocephalus without acute abnormality. Electronically Signed   By: Ulyses Jarred M.D.   On: 12/12/2018 03:06    Assessment/Plan: Doing much better.  Transfer to the floor today.  Mobilize today.  Estimated body mass index is 31.18 kg/m as calculated from the following:   Height as of this encounter: 5\' 4"  (1.626 m).   Weight as of this encounter: 82.4 kg.    LOS: 2 days    Eustace Moore 12/13/2018, 7:20 AM

## 2018-12-14 ENCOUNTER — Encounter (HOSPITAL_COMMUNITY): Payer: Self-pay | Admitting: Neurosurgery

## 2018-12-14 MED ORDER — ASPIRIN EC 81 MG PO TBEC: 81.0000 mg | DELAYED_RELEASE_TABLET | Freq: Every day | ORAL | Status: DC

## 2018-12-14 MED ORDER — METHOCARBAMOL 750 MG PO TABS: 750.0000 mg | ORAL_TABLET | Freq: Three times a day (TID) | ORAL | 1 refills | Status: DC | PRN

## 2018-12-14 MED ORDER — OXYCODONE-ACETAMINOPHEN 7.5-325 MG PO TABS: 1.0000 | ORAL_TABLET | ORAL | 0 refills | Status: DC | PRN

## 2018-12-14 NOTE — Progress Notes (Signed)
  NEUROSURGERY PROGRESS NOTE   No issues overnight.  Mild posterior neck pain Pre op HA resolved. Denies CSF HA Overall very happy with surgery and eager for d/c  EXAM:  BP (!) 173/78   Pulse 76   Temp 97.7 F (36.5 C) (Oral)   Resp 11   Ht 5\' 4"  (1.626 m)   Wt 82.4 kg   SpO2 96%   BMI 31.18 kg/m   Awake, alert, oriented  Speech fluent, appropriate  CN grossly intact  5/5 BUE/BLE  Incision: minimal dried blood on bandage. No swelling/flutuance/drainage.   IMPRESSION/PLAN 63 y.o. female s/p chiari decomrpession. Doing well. No evidence of CSF leak on exam - PT today then d/c home

## 2018-12-14 NOTE — Evaluation (Signed)
Physical Therapy Evaluation Patient Details Name: [name] MRN: [id number] DOB: 27-Nov-1955 Today's Date: 12/14/2018   History of Present Illness  Pt is a 63yo female who presenting with a Chiari Malformation who underwent a chiari decompression, s/p C1 laminectoy and suboccipital craniectomy. PMH: fibromyalgia, major depression, chronic narcotic use.  Clinical Impression  Pt admitted with above. Pt mobilizing well however demonstrates mild anxiety regarding mobility due to feeling unsteady. Pt steady with RW and anticipate pt will progress quickly to indep. Pt with good home set up and support. Pt currently functioning at supervision level. Acute PT to cont to follow. Instructed pt to talk to MD at her follow up about PT if she still feel dependent on RW.    Follow Up Recommendations No PT follow up;Supervision/Assistance - 24 hour(will re-assess at follow up)    Equipment Recommendations  Rolling walker with 5" wheels    Recommendations for Other Services       Precautions / Restrictions Precautions Precautions: Fall Restrictions Weight Bearing Restrictions: No      Mobility  Bed Mobility Overal bed mobility: Needs Assistance Bed Mobility: Rolling;Sidelying to Sit Rolling: Supervision Sidelying to sit: Supervision       General bed mobility comments: directional verbal cues to complete log roll to minimize strain on neck  Transfers Overall transfer level: Needs assistance Equipment used: Rolling walker (2 wheeled) Transfers: Sit to/from Stand Sit to Stand: Min guard         General transfer comment: verbal cues for safe hand placement, min guard due to patient stating "I just feel so unsteady"  Ambulation/Gait Ambulation/Gait assistance: Min guard Gait Distance (Feet): 125 Feet Assistive device: Rolling walker (2 wheeled) Gait Pattern/deviations: Step-through pattern;Decreased stride length Gait velocity: slow Gait velocity interpretation: <1.31 ft/sec,  indicative of household ambulator General Gait Details: verbal cues to relax shoulder and decrease UE dependency on walker, pt required minA to push walker forward to increase step length as pt with significantly short step length  Stairs            Wheelchair Mobility    Modified Rankin (Stroke Patients Only)       Balance Overall balance assessment: Needs assistance Sitting-balance support: Feet supported;No upper extremity supported Sitting balance-Leahy Scale: Fair     Standing balance support: Bilateral upper extremity supported Standing balance-Leahy Scale: Poor Standing balance comment: pt doesn't feel comfortable unless she is holding onto something                             Pertinent Vitals/Pain Pain Assessment: 0-10 Pain Score: 2  Pain Location: at surgical site and headache Pain Descriptors / Indicators: Headache;Pressure Pain Intervention(s): Monitored during session    Home Living Family/patient expects to be discharged to:: Private residence Living Arrangements: Spouse/significant other Available Help at Discharge: Family;Available 24 hours/day Type of Home: House Home Access: Stairs to enter Entrance Stairs-Rails: None Entrance Stairs-Number of Steps: 1 Home Layout: One level Home Equipment: None      Prior Function Level of Independence: Independent         Comments: was working until 5/18     Hand Dominance   Dominant Hand: Right    Extremity/Trunk Assessment   Upper Extremity Assessment Upper Extremity Assessment: Generalized weakness(limited MMT due to neck surgery)    Lower Extremity Assessment Lower Extremity Assessment: Generalized weakness    Cervical / Trunk Assessment Cervical / Trunk Assessment: Other exceptions Cervical / Trunk Exceptions:  just had surgery  Communication   Communication: No difficulties  Cognition Arousal/Alertness: Awake/alert Behavior During Therapy: WFL for tasks  assessed/performed Overall Cognitive Status: Within Functional Limits for tasks assessed                                 General Comments: tangental speech but AO x 4 and able to follow all commands, pt with a minor difficulty processing room numbers when naviagating back to her room during amb      General Comments General comments (skin integrity, edema, etc.): pt with dressing over posterior neck incision    Exercises     Assessment/Plan    PT Assessment Patient needs continued PT services  PT Problem List Decreased strength;Decreased activity tolerance;Decreased balance;Decreased mobility;Decreased coordination;Decreased cognition;Decreased knowledge of use of DME;Decreased safety awareness       PT Treatment Interventions DME instruction;Gait training;Stair training;Functional mobility training;Therapeutic activities;Therapeutic exercise;Neuromuscular re-education;Balance training    PT Goals (Current goals can be found in the Care Plan section)  Acute Rehab PT Goals Patient Stated Goal: go home today PT Goal Formulation: With patient Time For Goal Achievement: 12/28/18 Potential to Achieve Goals: Good    Frequency Min 4X/week   Barriers to discharge        Co-evaluation               AM-PAC PT "6 Clicks" Mobility  Outcome Measure Help needed turning from your back to your side while in a flat bed without using bedrails?: A Little Help needed moving from lying on your back to sitting on the side of a flat bed without using bedrails?: A Little Help needed moving to and from a bed to a chair (including a wheelchair)?: A Little Help needed standing up from a chair using your arms (e.g., wheelchair or bedside chair)?: A Little Help needed to walk in hospital room?: A Little Help needed climbing 3-5 steps with a railing? : A Little 6 Click Score: 18    End of Session Equipment Utilized During Treatment: Gait belt Activity Tolerance: Patient  tolerated treatment well Patient left: in chair;with call bell/phone within reach;with nursing/sitter in room Nurse Communication: Mobility status PT Visit Diagnosis: Unsteadiness on feet (R26.81);Muscle weakness (generalized) (M62.81)    Time: 6659-9357 PT Time Calculation (min) (ACUTE ONLY): 30 min   Charges:   PT Evaluation $PT Eval Moderate Complexity: 1 Mod PT Treatments $Gait Training: 8-22 mins        [name], PT, DPT Acute Rehabilitation Services Pager #: [phone] Office #: [phone]   Berline Lopes 12/14/2018, 9:05 AM

## 2018-12-14 NOTE — Discharge Summary (Signed)
Physician Discharge Summary  Patient ID: [name] MRN: [id number] DOB/AGE: 61/28/1957 61 y.o.  Admit date: 12/11/2018 Discharge date: 12/14/2018  Admission Diagnoses:  Chiari malformation  Discharge Diagnoses:  Same Active Problems:   Chiari malformation type I [facility])   Discharged Condition: Stable  Hospital Course:  [name] is a 63 y.o. female who was admitted for the below procedure. Post op complicated by acute on chronic low back flare secondary to fibromyalgia (resolved with steroids) and acute altered mental status (likely secondary to anesthesia and pneumocephalus that resolved after several hours). At time of discharge, pain was well controlled, ambulating with Pt/OT, tolerating po, voiding normal. Ready for discharge.  Treatments: Surgery 1. Suboccipital craniectomy with complete C1 laminectomy 2. Expansile Duraplasty  Discharge Exam: Blood pressure (!) 173/78, pulse 76, temperature 97.7 F (36.5 C), temperature source Oral, resp. rate 11, height 5\' 4"  (1.626 m), weight 82.4 kg, SpO2 96 %. Awake, alert, oriented Speech fluent, appropriate CN grossly intact 5/5 BUE/BLE Wound c/d/i  Disposition: Discharge disposition: 01-Home or Self Care       Discharge Instructions    Call MD for:  difficulty breathing, headache or visual disturbances   Complete by:  As directed    Call MD for:  persistant dizziness or light-headedness   Complete by:  As directed    Call MD for:  redness, tenderness, or signs of infection (pain, swelling, redness, odor or green/yellow discharge around incision site)   Complete by:  As directed    Call MD for:  severe uncontrolled pain   Complete by:  As directed    Call MD for:  temperature >100.4   Complete by:  As directed    Diet general   Complete by:  As directed    Driving Restrictions   Complete by:  As directed    Do not drive until given clearance.   Increase activity slowly   Complete by:  As directed    Lifting  restrictions   Complete by:  As directed    Do not lift anything >10lbs. Avoid bending and twisting in awkward positions. Avoid bending at the back.   May shower / Bathe   Complete by:  As directed    In 24 hours. Okay to wash wound with warm soapy water. Avoid scrubbing the wound. Pat dry.   Remove dressing in 24 hours   Complete by:  As directed      Allergies as of 12/14/2018      Reactions   Codeine Sulfate Hives, Itching   Moxifloxacin Hives, Itching   Penicillins Hives, Itching   Tolerates cephalosporins Did it involve swelling of the face/tongue/throat, SOB, or low BP? No Did it involve sudden or severe rash/hives, skin peeling, or any reaction on the inside of your mouth or nose? No Did you need to seek medical attention at a hospital or doctor's office? No When did it last happen? If all above answers are "NO", may proceed with cephalosporin use.   Sulfonamide Derivatives Hives, Rash   Biaxin [clarithromycin] Nausea Only   Metallic taste   Diphenhydramine Hcl Other (See Comments)   side effect of being irritable   Tramadol Hcl Nausea And Vomiting   "Doesn't agree with my system." Does not help pain and causes vomiting.      Medication List    TAKE these medications   aspirin EC 81 MG tablet Take 1 tablet (81 mg total) by mouth daily. Start taking on:  December 18, 2018  What changed:  These instructions start on December 18, 2018. If you are unsure what to do until then, ask your doctor or other care provider.   atorvastatin 40 MG tablet Commonly known as:  LIPITOR Take 1 tablet (40 mg total) by mouth daily.   baclofen 10 MG tablet Commonly known as:  LIORESAL Take 10 mg by mouth 2 (two) times daily as needed (spasms/headaches.).   clonazePAM 0.5 MG tablet Commonly known as:  KlonoPIN Take 1 tablet (0.5 mg total) by mouth daily. What changed:  when to take this   DAYQUIL PO Take 2 capsules by mouth every 4 (four) hours as needed (cold symptoms.).    HYDROcodone-acetaminophen 5-325 MG tablet Commonly known as:  NORCO/VICODIN Take 1 tablet by mouth 3 (three) times daily as needed for moderate pain.   loperamide 2 MG capsule Commonly known as:  IMODIUM Take 1 capsule (2 mg total) by mouth 4 (four) times daily as needed. What changed:  reasons to take this   methocarbamol 750 MG tablet Commonly known as:  Robaxin-750 Take 1 tablet (750 mg total) by mouth 3 (three) times daily as needed for muscle spasms.   oxyCODONE-acetaminophen 7.5-325 MG tablet Commonly known as:  Percocet Take 1 tablet by mouth every 4 (four) hours as needed for severe pain.   potassium chloride SA 20 MEQ tablet Commonly known as:  K-DUR Take 1 tablet (20 mEq total) by mouth 2 (two) times daily.   promethazine 25 MG tablet Commonly known as:  PHENERGAN Take 1 tablet (25 mg total) by mouth every 6 (six) hours as needed. for nausea What changed:    reasons to take this  additional instructions   venlafaxine 37.5 MG tablet Commonly known as:  EFFEXOR Take 1 tablet (37.5 mg total) by mouth 2 (two) times daily.   verapamil 240 MG CR tablet Commonly known as:  CALAN-SR Take 1 tablet (240 mg total) by mouth at bedtime.   Vitamin D 1000 units capsule Take 1,000 Units by mouth daily.   zonisamide 50 MG capsule Commonly known as:  ZONEGRAN Take 100 mg by mouth at bedtime.      Follow-up Information    Lisbeth Renshaw, MD Follow up.   Specialty:  Neurosurgery Contact information: [address] [place] [address] [phone]           Signed: Alyson Ingles 12/14/2018, 7:55 AM

## 2018-12-14 NOTE — Progress Notes (Signed)
RN review after visit summary with patient.  No questions per patient.  Neurosurgery PA called and updated on patient's condition.  Patient discharge and picked up by patient's daughter.

## 2019-01-14 ENCOUNTER — Ambulatory Visit (INDEPENDENT_AMBULATORY_CARE_PROVIDER_SITE_OTHER): Payer: 59 | Admitting: Otolaryngology

## 2019-01-14 DIAGNOSIS — J322 Chronic ethmoidal sinusitis: Secondary | ICD-10-CM

## 2019-01-14 DIAGNOSIS — J321 Chronic frontal sinusitis: Secondary | ICD-10-CM | POA: Diagnosis not present

## 2019-01-27 ENCOUNTER — Other Ambulatory Visit: Payer: Self-pay

## 2019-01-27 ENCOUNTER — Ambulatory Visit: Payer: 59 | Admitting: Family Medicine

## 2019-01-27 ENCOUNTER — Encounter: Payer: Self-pay | Admitting: Family Medicine

## 2019-01-27 VITALS — BP 124/82 | HR 86 | Temp 98.8°F | Resp 16 | Ht 64.0 in | Wt 170.4 lb

## 2019-01-27 DIAGNOSIS — M797 Fibromyalgia: Secondary | ICD-10-CM | POA: Diagnosis not present

## 2019-01-27 DIAGNOSIS — F1190 Opioid use, unspecified, uncomplicated: Secondary | ICD-10-CM | POA: Diagnosis not present

## 2019-01-27 DIAGNOSIS — F339 Major depressive disorder, recurrent, unspecified: Secondary | ICD-10-CM | POA: Diagnosis not present

## 2019-01-27 DIAGNOSIS — G935 Compression of brain: Secondary | ICD-10-CM

## 2019-01-27 DIAGNOSIS — G894 Chronic pain syndrome: Secondary | ICD-10-CM | POA: Diagnosis not present

## 2019-01-27 MED ORDER — HYDROCODONE-ACETAMINOPHEN 5-325 MG PO TABS: 1.0000 | ORAL_TABLET | Freq: Three times a day (TID) | ORAL | 0 refills | Status: DC | PRN

## 2019-01-27 MED ORDER — VENLAFAXINE HCL 50 MG PO TABS: 50.0000 mg | ORAL_TABLET | Freq: Two times a day (BID) | ORAL | 1 refills | Status: DC

## 2019-01-27 NOTE — Progress Notes (Signed)
Subjective  CC:  Chief Complaint  Patient presents with   Hypertension   Pain   Nausea    Worse when getting up and moving around, had f/u with Neurosurgeon 7/16 was given nausea patch and is still taking promethazine as needed    HPI: [name] is a 63 y.o. female who presents to the office today to address the problems listed above in the chief complaint.  Complicated patient who is recovering from Chiari malformation neurosurgery presents for follow-up.  Unfortunately she continues to not do very well.  The good news is she is no longer having her painful headaches that were from the malformation.  However she complains of low motivation, depressive symptoms, dizziness, fatigue, nausea, and persistent fibromyalgia pain.  She has a long history of chronic pain syndrome, fibromyalgia.  She has seen multiple specialist in the past.  Prior to seeing me she was being treated by her PCP for chronic pain with 3 times daily hydrocodone but because it made her nauseous, she also takes Phenergan 3 times daily.  She currently complains of unclear mental thinking, problem with numbers which is unusual for her and persistent nausea.  She has been seen by Dr. Corliss Skainseveshwar in the past.  She has failed multiple typical treatments for fibromyalgia.  She has never seen a psychiatrist, she is on Effexor for pain management but does admit to depression.  She was on chronic benzodiazepines but stopped abruptly.  She denies symptoms of withdrawal at this time. Assessment  1. Chronic pain syndrome   2. Fibromyalgia   3. Chronic narcotic use   4. Major depression, recurrent, chronic (HCC)   5. Chiari malformation type I (HCC)      Plan   Chronic pain syndrome, fibromyalgia and chronic narcotic use with side effects: Currently uncontrolled.  Given her long history and persistent symptoms, refer for pain management.  I would like to change her pain medications due to her side effects.  I am concerned  that chronic narcotics in addition to Phenergan and muscle relaxers are affecting her mental clarity.  Once pain is better controlled she will follow-up with me for further evaluation of mental status, memory.  She may need neurologic evaluation.  Major depression currently, increase Effexor to 50 twice daily.  I reviewed the drug database, pain medications are being prescribed and filled appropriately.  Follow up: 8 weeks for recheck Visit date not found  Orders Placed This Encounter  Procedures   Ambulatory referral to Pain Clinic   Meds ordered this encounter  Medications   HYDROcodone-acetaminophen (NORCO/VICODIN) 5-325 MG tablet    Sig: Take 1 tablet by mouth 3 (three) times daily as needed for moderate pain.    Dispense:  90 tablet    Refill:  0   venlafaxine (EFFEXOR) 50 MG tablet    Sig: Take 1 tablet (50 mg total) by mouth 2 (two) times daily.    Dispense:  180 tablet    Refill:  1    Increasing from 37.5 mg dose.      I reviewed the patients updated PMH, FH, and SocHx.    Patient Active Problem List   Diagnosis Date Noted   Chronic prescription benzodiazepine use 10/23/2018    Priority: High   Chiari malformation type I (HCC) 10/23/2018   Chronic narcotic use 06/19/2018   Hypokalemia 06/19/2018   Migraine without aura and without status migrainosus, not intractable 06/19/2018   Fibromyalgia 12/01/2012   Major depression, recurrent, chronic (HCC)  05/21/2010   Dyslipidemia 12/13/2008   Collagenous colitis 01/01/2008   Chronic pain 12/02/2007   Essential hypertension 04/28/2007   History of recurrent UTI (urinary tract infection) 04/28/2007   Nephrolithiasis 04/28/2007   Current Meds  Medication Sig   aspirin EC 81 MG tablet Take 1 tablet (81 mg total) by mouth daily.   atorvastatin (LIPITOR) 40 MG tablet Take 1 tablet (40 mg total) by mouth daily.   Cholecalciferol (VITAMIN D) 1000 UNITS capsule Take 1,000 Units by mouth daily.      HYDROcodone-acetaminophen (NORCO/VICODIN) 5-325 MG tablet Take 1 tablet by mouth 3 (three) times daily as needed for moderate pain.   loperamide (IMODIUM) 2 MG capsule Take 1 capsule (2 mg total) by mouth 4 (four) times daily as needed. (Patient taking differently: Take 2 mg by mouth 4 (four) times daily as needed (collagenous colitis). )   methocarbamol (ROBAXIN-750) 750 MG tablet Take 1 tablet (750 mg total) by mouth 3 (three) times daily as needed for muscle spasms.   potassium chloride SA (K-DUR,KLOR-CON) 20 MEQ tablet Take 1 tablet (20 mEq total) by mouth 2 (two) times daily.   promethazine (PHENERGAN) 25 MG tablet Take 1 tablet (25 mg total) by mouth every 6 (six) hours as needed. for nausea (Patient taking differently: Take 25 mg by mouth every 6 (six) hours as needed (nausea (take with hydrocodone)). )   Pseudoephedrine-APAP-DM (DAYQUIL PO) Take 2 capsules by mouth every 4 (four) hours as needed (cold symptoms.).   scopolamine (TRANSDERM-SCOP) 1 MG/3DAYS PLEASE SEE ATTACHED FOR DETAILED DIRECTIONS   venlafaxine (EFFEXOR) 50 MG tablet Take 1 tablet (50 mg total) by mouth 2 (two) times daily.   verapamil (CALAN-SR) 240 MG CR tablet Take 1 tablet (240 mg total) by mouth at bedtime.   [DISCONTINUED] HYDROcodone-acetaminophen (NORCO/VICODIN) 5-325 MG tablet Take 1 tablet by mouth 3 (three) times daily as needed for moderate pain.   [DISCONTINUED] venlafaxine (EFFEXOR) 37.5 MG tablet Take 1 tablet (37.5 mg total) by mouth 2 (two) times daily.    Allergies: Patient is allergic to codeine sulfate; moxifloxacin; penicillins; sulfonamide derivatives; biaxin [clarithromycin]; diphenhydramine hcl; and tramadol hcl. Family History: Patient family history includes Alcohol abuse in her father; Arrhythmia in her daughter; Healthy in her sister; Hyperlipidemia in her brother, brother, daughter, and mother; Hypertension in her brother, brother, and mother; Parkinson's disease in her mother;  Polycystic ovary syndrome in her daughter. Social History:  Patient  reports that she quit smoking about 11 years ago. Her smoking use included cigarettes. She smoked 1.00 pack per day. She has never used smokeless tobacco. She reports that she does not drink alcohol or use drugs.  Review of Systems: Constitutional: Negative for fever malaise or anorexia Cardiovascular: negative for chest pain Respiratory: negative for SOB or persistent cough Gastrointestinal: negative for abdominal pain  Objective  Vitals: BP 124/82    Pulse 86    Temp 98.8 F (37.1 C) (Oral)    Resp 16    Ht 5\' 4"  (1.626 m)    Wt 170 lb 6.4 oz (77.3 kg)    SpO2 98%    BMI 29.25 kg/m  General: no acute distress , A&Ox3 Psych: Very flat affect, hypokinetic HEENT: PEERL, conjunctiva normal, Oropharynx moist,neck is supple Cardiovascular:  RRR without murmur or gallop.  Respiratory:  Good breath sounds bilaterally, CTAB with normal respiratory effort Skin:  Warm, no rashes     Commons side effects, risks, benefits, and alternatives for medications and treatment plan prescribed today were discussed,  and the patient expressed understanding of the given instructions. Patient is instructed to call or message via MyChart if he/she has any questions or concerns regarding our treatment plan. No barriers to understanding were identified. We discussed Red Flag symptoms and signs in detail. Patient expressed understanding regarding what to do in case of urgent or emergency type symptoms.   Medication list was reconciled, printed and provided to the patient in AVS. Patient instructions and summary information was reviewed with the patient as documented in the AVS. This note was prepared with assistance of Dragon voice recognition software. Occasional wrong-word or sound-a-like substitutions may have occurred due to the inherent limitations of voice recognition software

## 2019-01-27 NOTE — Patient Instructions (Signed)
Please return in 8 weeks for recheck mood.   We will call you with information regarding your referral appointment. Pain clinic for fibromyalgia pain.  If you do not hear from [place] within the next 2 weeks, please let me know. It can take 1-2 weeks to get appointments set up with the specialists.   I have increased your dose of effexor.   If you have any questions or concerns, please don't hesitate to send me a message via MyChart or call the office at [phone]. Thank you for visiting with [place] today! It's our pleasure caring for you.

## 2019-02-11 ENCOUNTER — Encounter: Payer: Self-pay | Admitting: Family Medicine

## 2019-02-16 ENCOUNTER — Encounter: Payer: Self-pay | Admitting: Family Medicine

## 2019-02-18 ENCOUNTER — Encounter: Payer: Self-pay | Admitting: *Deleted

## 2019-02-22 ENCOUNTER — Encounter (HOSPITAL_BASED_OUTPATIENT_CLINIC_OR_DEPARTMENT_OTHER): Payer: Self-pay

## 2019-02-22 ENCOUNTER — Other Ambulatory Visit: Payer: Self-pay

## 2019-02-25 ENCOUNTER — Other Ambulatory Visit (HOSPITAL_COMMUNITY)
Admission: RE | Admit: 2019-02-25 | Discharge: 2019-02-25 | Disposition: A | Discharge status: Home / Self Care | Payer: 59 | Source: Ambulatory Visit | Attending: Otolaryngology | Admitting: Otolaryngology

## 2019-02-25 DIAGNOSIS — Z01812 Encounter for preprocedural laboratory examination: Secondary | ICD-10-CM | POA: Diagnosis not present

## 2019-02-25 DIAGNOSIS — Z20828 Contact with and (suspected) exposure to other viral communicable diseases: Secondary | ICD-10-CM | POA: Insufficient documentation

## 2019-02-25 LAB — SARS CORONAVIRUS 2 (TAT 6-24 HRS): SARS Coronavirus 2: NEGATIVE

## 2019-02-26 ENCOUNTER — Other Ambulatory Visit: Payer: Self-pay | Admitting: Otolaryngology

## 2019-03-01 ENCOUNTER — Other Ambulatory Visit: Payer: Self-pay

## 2019-03-01 ENCOUNTER — Ambulatory Visit (HOSPITAL_BASED_OUTPATIENT_CLINIC_OR_DEPARTMENT_OTHER)
Admission: RE | Admit: 2019-03-01 | Discharge: 2019-03-01 | Disposition: A | Discharge status: Home / Self Care | Payer: 59 | Attending: Otolaryngology | Admitting: Otolaryngology

## 2019-03-01 ENCOUNTER — Ambulatory Visit (HOSPITAL_BASED_OUTPATIENT_CLINIC_OR_DEPARTMENT_OTHER): Payer: 59 | Admitting: Anesthesiology

## 2019-03-01 ENCOUNTER — Encounter (HOSPITAL_BASED_OUTPATIENT_CLINIC_OR_DEPARTMENT_OTHER): Payer: Self-pay

## 2019-03-01 ENCOUNTER — Encounter (HOSPITAL_BASED_OUTPATIENT_CLINIC_OR_DEPARTMENT_OTHER)
Admission: RE | Disposition: A | Discharge status: Home / Self Care | Payer: Self-pay | Source: Home / Self Care | Attending: Otolaryngology

## 2019-03-01 DIAGNOSIS — Z87728 Personal history of other specified (corrected) congenital malformations of nervous system and sense organs: Secondary | ICD-10-CM | POA: Diagnosis not present

## 2019-03-01 DIAGNOSIS — J338 Other polyp of sinus: Secondary | ICD-10-CM | POA: Diagnosis not present

## 2019-03-01 DIAGNOSIS — J321 Chronic frontal sinusitis: Secondary | ICD-10-CM | POA: Insufficient documentation

## 2019-03-01 DIAGNOSIS — Z87891 Personal history of nicotine dependence: Secondary | ICD-10-CM | POA: Diagnosis not present

## 2019-03-01 DIAGNOSIS — I10 Essential (primary) hypertension: Secondary | ICD-10-CM | POA: Insufficient documentation

## 2019-03-01 DIAGNOSIS — F329 Major depressive disorder, single episode, unspecified: Secondary | ICD-10-CM | POA: Insufficient documentation

## 2019-03-01 DIAGNOSIS — R51 Headache: Secondary | ICD-10-CM | POA: Insufficient documentation

## 2019-03-01 DIAGNOSIS — Z7982 Long term (current) use of aspirin: Secondary | ICD-10-CM | POA: Insufficient documentation

## 2019-03-01 DIAGNOSIS — J322 Chronic ethmoidal sinusitis: Secondary | ICD-10-CM

## 2019-03-01 DIAGNOSIS — Z79899 Other long term (current) drug therapy: Secondary | ICD-10-CM | POA: Insufficient documentation

## 2019-03-01 HISTORY — PX: SINUS ENDO WITH FUSION: SHX5329

## 2019-03-01 HISTORY — DX: Compression of brain: G93.5

## 2019-03-01 HISTORY — PX: ETHMOIDECTOMY: SHX5197

## 2019-03-01 HISTORY — DX: Anxiety disorder, unspecified: F41.9

## 2019-03-01 HISTORY — PX: FRONTAL SINUS EXPLORATION: SHX6591

## 2019-03-01 SURGERY — ETHMOIDECTOMY
Anesthesia: General | Site: Nose | Laterality: Right

## 2019-03-01 MED ORDER — OXYCODONE HCL 5 MG PO TABS
5.0000 mg | ORAL_TABLET | Freq: Once | ORAL | Status: AC
  Administered 2019-03-01: 5 mg via ORAL

## 2019-03-01 MED ORDER — ROCURONIUM BROMIDE 100 MG/10ML IV SOLN
INTRAVENOUS | Status: DC | PRN
  Administered 2019-03-01: 50 mg via INTRAVENOUS

## 2019-03-01 MED ORDER — METOCLOPRAMIDE HCL 5 MG/ML IJ SOLN: 10.0000 mg | Freq: Once | INTRAMUSCULAR | Status: DC | PRN

## 2019-03-01 MED ORDER — ONDANSETRON HCL 4 MG/2ML IJ SOLN
INTRAMUSCULAR | Status: DC | PRN
  Administered 2019-03-01: 4 mg via INTRAVENOUS

## 2019-03-01 MED ORDER — DEXAMETHASONE SODIUM PHOSPHATE 10 MG/ML IJ SOLN
INTRAMUSCULAR | Status: AC
  Filled 2019-03-01: qty 1

## 2019-03-01 MED ORDER — PROPOFOL 10 MG/ML IV BOLUS
INTRAVENOUS | Status: DC | PRN
  Administered 2019-03-01: 150 mg via INTRAVENOUS

## 2019-03-01 MED ORDER — DEXAMETHASONE SODIUM PHOSPHATE 4 MG/ML IJ SOLN
INTRAMUSCULAR | Status: DC | PRN
  Administered 2019-03-01: 10 mg via INTRAVENOUS

## 2019-03-01 MED ORDER — SUGAMMADEX SODIUM 500 MG/5ML IV SOLN
INTRAVENOUS | Status: DC | PRN
  Administered 2019-03-01: 400 mg via INTRAVENOUS

## 2019-03-01 MED ORDER — FENTANYL CITRATE (PF) 100 MCG/2ML IJ SOLN
INTRAMUSCULAR | Status: AC
  Filled 2019-03-01: qty 2

## 2019-03-01 MED ORDER — OXYMETAZOLINE HCL 0.05 % NA SOLN
NASAL | Status: AC
  Filled 2019-03-01: qty 30

## 2019-03-01 MED ORDER — LIDOCAINE 2% (20 MG/ML) 5 ML SYRINGE
INTRAMUSCULAR | Status: AC
  Filled 2019-03-01: qty 5

## 2019-03-01 MED ORDER — LACTATED RINGERS IV SOLN: INTRAVENOUS | Status: DC

## 2019-03-01 MED ORDER — ROCURONIUM BROMIDE 10 MG/ML (PF) SYRINGE
PREFILLED_SYRINGE | INTRAVENOUS | Status: AC
  Filled 2019-03-01: qty 10

## 2019-03-01 MED ORDER — FENTANYL CITRATE (PF) 100 MCG/2ML IJ SOLN
25.0000 ug | INTRAMUSCULAR | Status: DC | PRN
  Administered 2019-03-01: 12:00:00 25 ug via INTRAVENOUS
  Administered 2019-03-01: 50 ug via INTRAVENOUS
  Administered 2019-03-01: 25 ug via INTRAVENOUS

## 2019-03-01 MED ORDER — MEPERIDINE HCL 25 MG/ML IJ SOLN: 6.2500 mg | INTRAMUSCULAR | Status: DC | PRN

## 2019-03-01 MED ORDER — CLINDAMYCIN PHOSPHATE 900 MG/50ML IV SOLN
INTRAVENOUS | Status: AC
  Filled 2019-03-01: qty 50

## 2019-03-01 MED ORDER — SCOPOLAMINE 1 MG/3DAYS TD PT72
MEDICATED_PATCH | TRANSDERMAL | Status: AC
  Filled 2019-03-01: qty 1

## 2019-03-01 MED ORDER — LACTATED RINGERS IV SOLN
INTRAVENOUS | Status: DC
  Administered 2019-03-01 (×2): via INTRAVENOUS

## 2019-03-01 MED ORDER — OXYCODONE-ACETAMINOPHEN 5-325 MG PO TABS: 1.0000 | ORAL_TABLET | ORAL | 0 refills | Status: AC | PRN

## 2019-03-01 MED ORDER — MIDAZOLAM HCL 5 MG/5ML IJ SOLN
INTRAMUSCULAR | Status: DC | PRN
  Administered 2019-03-01: 1 mg via INTRAVENOUS

## 2019-03-01 MED ORDER — MIDAZOLAM HCL 2 MG/2ML IJ SOLN
INTRAMUSCULAR | Status: AC
  Filled 2019-03-01: qty 2

## 2019-03-01 MED ORDER — PROPOFOL 10 MG/ML IV BOLUS
INTRAVENOUS | Status: AC
  Filled 2019-03-01: qty 20

## 2019-03-01 MED ORDER — MUPIROCIN 2 % EX OINT
TOPICAL_OINTMENT | CUTANEOUS | Status: DC | PRN
  Administered 2019-03-01: 1 via TOPICAL

## 2019-03-01 MED ORDER — LIDOCAINE HCL (CARDIAC) PF 100 MG/5ML IV SOSY
PREFILLED_SYRINGE | INTRAVENOUS | Status: DC | PRN
  Administered 2019-03-01: 100 mg via INTRAVENOUS

## 2019-03-01 MED ORDER — OXYMETAZOLINE HCL 0.05 % NA SOLN
NASAL | Status: DC | PRN
  Administered 2019-03-01: 1 via TOPICAL

## 2019-03-01 MED ORDER — OXYCODONE HCL 5 MG PO TABS
ORAL_TABLET | ORAL | Status: AC
  Filled 2019-03-01: qty 1

## 2019-03-01 MED ORDER — FENTANYL CITRATE (PF) 100 MCG/2ML IJ SOLN
INTRAMUSCULAR | Status: DC | PRN
  Administered 2019-03-01 (×2): 50 ug via INTRAVENOUS

## 2019-03-01 MED ORDER — CLINDAMYCIN PHOSPHATE 900 MG/50ML IV SOLN
INTRAVENOUS | Status: DC | PRN
  Administered 2019-03-01: 900 mg via INTRAVENOUS

## 2019-03-01 MED ORDER — CLINDAMYCIN HCL 300 MG PO CAPS: 300.0000 mg | ORAL_CAPSULE | Freq: Three times a day (TID) | ORAL | 0 refills | Status: AC

## 2019-03-01 MED ORDER — ONDANSETRON HCL 4 MG/2ML IJ SOLN
INTRAMUSCULAR | Status: AC
  Filled 2019-03-01: qty 2

## 2019-03-01 MED ORDER — SCOPOLAMINE 1 MG/3DAYS TD PT72
1.0000 | MEDICATED_PATCH | Freq: Once | TRANSDERMAL | Status: DC
  Administered 2019-03-01: 1.5 mg via TRANSDERMAL

## 2019-03-01 SURGICAL SUPPLY — 55 items
ATTRACTOMAT 16X20 MAGNETIC DRP (DRAPES) IMPLANT
BLADE RAD40 ROTATE 4M 4 5PK (BLADE) IMPLANT
BLADE RAD60 ROTATE M4 4 5PK (BLADE) IMPLANT
BLADE ROTATE RAD 12 4 M4 (BLADE) IMPLANT
BLADE ROTATE RAD 40 4 M4 (BLADE) IMPLANT
BLADE ROTATE TRICUT 4X13 M4 (BLADE) ×2 IMPLANT
BLADE TRICUT ROTATE M4 4 5PK (BLADE) IMPLANT
BUR HS RAD FRONTAL 3 (BURR) IMPLANT
CANISTER SUC SOCK COL 7IN (MISCELLANEOUS) ×3 IMPLANT
CANISTER SUCT 1200ML W/VALVE (MISCELLANEOUS) ×2 IMPLANT
COAGULATOR SUCT 8FR VV (MISCELLANEOUS) IMPLANT
COAGULATOR SUCT SWTCH 10FR 6 (ELECTROSURGICAL) IMPLANT
COVER WAND RF STERILE (DRAPES) IMPLANT
DECANTER SPIKE VIAL GLASS SM (MISCELLANEOUS) IMPLANT
DRSG NASAL KENNEDY LMNT 8CM (GAUZE/BANDAGES/DRESSINGS) IMPLANT
DRSG NASOPORE 8CM (GAUZE/BANDAGES/DRESSINGS) ×1 IMPLANT
DRSG TELFA 3X8 NADH (GAUZE/BANDAGES/DRESSINGS) IMPLANT
ELECT REM PT RETURN 9FT ADLT (ELECTROSURGICAL) ×2
ELECTRODE REM PT RTRN 9FT ADLT (ELECTROSURGICAL) IMPLANT
GLOVE BIO SURGEON STRL SZ7.5 (GLOVE) ×2 IMPLANT
GLOVE BIOGEL PI IND STRL 7.0 (GLOVE) IMPLANT
GLOVE BIOGEL PI INDICATOR 7.0 (GLOVE) ×1
GLOVE ECLIPSE 6.5 STRL STRAW (GLOVE) ×1 IMPLANT
GOWN STRL REUS W/ TWL LRG LVL3 (GOWN DISPOSABLE) ×2 IMPLANT
GOWN STRL REUS W/TWL LRG LVL3 (GOWN DISPOSABLE) ×4
HEMOSTAT SURGICEL 2X14 (HEMOSTASIS) IMPLANT
IV NS 1000ML (IV SOLUTION)
IV NS 1000ML BAXH (IV SOLUTION) IMPLANT
IV NS 500ML (IV SOLUTION) ×2
IV NS 500ML BAXH (IV SOLUTION) ×1 IMPLANT
NDL HYPO 25X1 1.5 SAFETY (NEEDLE) ×1 IMPLANT
NDL PRECISIONGLIDE 27X1.5 (NEEDLE) ×1 IMPLANT
NDL SPNL 25GX3.5 QUINCKE BL (NEEDLE) IMPLANT
NEEDLE HYPO 25X1 1.5 SAFETY (NEEDLE) IMPLANT
NEEDLE PRECISIONGLIDE 27X1.5 (NEEDLE) IMPLANT
NEEDLE SPNL 25GX3.5 QUINCKE BL (NEEDLE) IMPLANT
NS IRRIG 1000ML POUR BTL (IV SOLUTION) ×1 IMPLANT
PACK BASIN DAY SURGERY FS (CUSTOM PROCEDURE TRAY) ×2 IMPLANT
PACK ENT DAY SURGERY (CUSTOM PROCEDURE TRAY) ×2 IMPLANT
PAD DRESSING TELFA 3X8 NADH (GAUZE/BANDAGES/DRESSINGS) IMPLANT
SLEEVE SCD COMPRESS KNEE MED (MISCELLANEOUS) ×1 IMPLANT
SOLUTION BUTLER CLEAR DIP (MISCELLANEOUS) ×2 IMPLANT
SPLINT NASAL AIRWAY SILICONE (MISCELLANEOUS) IMPLANT
SPONGE GAUZE 2X2 8PLY STRL LF (GAUZE/BANDAGES/DRESSINGS) ×2 IMPLANT
SPONGE NEURO XRAY DETECT 1X3 (DISPOSABLE) ×2 IMPLANT
SUCTION FRAZIER HANDLE 10FR (MISCELLANEOUS)
SUCTION TUBE FRAZIER 10FR DISP (MISCELLANEOUS) IMPLANT
SYR 50ML LL SCALE MARK (SYRINGE) ×2 IMPLANT
TOWEL GREEN STERILE FF (TOWEL DISPOSABLE) ×2 IMPLANT
TRACKER ENT INSTRUMENT (MISCELLANEOUS) ×2 IMPLANT
TRACKER ENT PATIENT (MISCELLANEOUS) ×2 IMPLANT
TUBE CONNECTING 20X1/4 (TUBING) ×2 IMPLANT
TUBE SALEM SUMP 16 FR W/ARV (TUBING) ×1 IMPLANT
TUBING STRAIGHTSHOT EPS 5PK (TUBING) ×2 IMPLANT
YANKAUER SUCT BULB TIP NO VENT (SUCTIONS) ×2 IMPLANT

## 2019-03-01 NOTE — Anesthesia Preprocedure Evaluation (Signed)
Anesthesia Evaluation  Patient identified by MRN, date of birth, ID band Patient awake    Reviewed: Allergy & Precautions, NPO status , Patient's Chart, lab work & pertinent test results  History of Anesthesia Complications (+) PONV and history of anesthetic complications  Airway Mallampati: II  TM Distance: >3 FB Neck ROM: Full    Dental no notable dental hx. (+) Dental Advisory Given   Pulmonary neg pulmonary ROS, former smoker,    Pulmonary exam normal        Cardiovascular hypertension, Pt. on medications negative cardio ROS Normal cardiovascular exam     Neuro/Psych PSYCHIATRIC DISORDERS Depression negative neurological ROS     GI/Hepatic negative GI ROS, Neg liver ROS,   Endo/Other  negative endocrine ROS  Renal/GU Renal disease  negative genitourinary   Musculoskeletal  (+) Fibromyalgia -  Abdominal   Peds negative pediatric ROS (+)  Hematology negative hematology ROS (+)   Anesthesia Other Findings   Reproductive/Obstetrics negative OB ROS                             Anesthesia Physical  Anesthesia Plan  ASA: III  Anesthesia Plan: General   Post-op Pain Management:    Induction: Intravenous  PONV Risk Score and Plan: 4 or greater and Ondansetron, Dexamethasone, Scopolamine patch - Pre-op, Diphenhydramine, Midazolam, Metaclopromide, Promethazine and Treatment may vary due to age or medical condition  Airway Management Planned: Oral ETT  Additional Equipment:   Intra-op Plan:   Post-operative Plan: Extubation in OR  Informed Consent: I have reviewed the patients History and Physical, chart, labs and discussed the procedure including the risks, benefits and alternatives for the proposed anesthesia with the patient or authorized representative who has indicated his/her understanding and acceptance.     Dental advisory given  Plan Discussed with: CRNA and  Anesthesiologist  Anesthesia Plan Comments:        Anesthesia Quick Evaluation

## 2019-03-01 NOTE — Anesthesia Procedure Notes (Signed)
Procedure Name: Intubation Performed by: Dreanna Kyllo M, CRNA Pre-anesthesia Checklist: Patient identified, Emergency Drugs available, Suction available, Patient being monitored and Timeout performed Patient Re-evaluated:Patient Re-evaluated prior to induction Oxygen Delivery Method: Circle system utilized Preoxygenation: Pre-oxygenation with 100% oxygen Induction Type: IV induction Ventilation: Mask ventilation without difficulty Laryngoscope Size: Mac and 4 Grade View: Grade I Tube type: Oral Tube size: 7.0 mm Airway Equipment and Method: Stylet Placement Confirmation: ETT inserted through vocal cords under direct vision,  positive ETCO2,  CO2 detector and breath sounds checked- equal and bilateral Secured at: 22 cm Tube secured with: Tape Dental Injury: Teeth and Oropharynx as per pre-operative assessment        

## 2019-03-01 NOTE — Anesthesia Postprocedure Evaluation (Signed)
Anesthesia Post Note  Patient: [name]  Procedure(s) Performed: TOTAL ETHMOIDECTOMY (Right Nose) FRONTAL RECESS EXPLORATION (Right Nose) SINUS ENDOSCOPY WITH FUSION NAVIGATION (Right Nose)     Patient location during evaluation: PACU Anesthesia Type: General Level of consciousness: awake and alert Pain management: pain level controlled Vital Signs Assessment: post-procedure vital signs reviewed and stable Respiratory status: spontaneous breathing, nonlabored ventilation, respiratory function stable and patient connected to nasal cannula oxygen Cardiovascular status: blood pressure returned to baseline and stable Postop Assessment: no apparent nausea or vomiting Anesthetic complications: no    Last Vitals:  Vitals:   03/01/19 1245 03/01/19 1257  BP: 137/75 139/83  Pulse: 80   Resp: 18 19  Temp:    SpO2: 92%     Last Pain:  Vitals:   03/01/19 1135  PainSc: Asleep                 Montez Hageman

## 2019-03-01 NOTE — Op Note (Signed)
DATE OF PROCEDURE: 03/01/2019  OPERATIVE REPORT   SURGEON: Leta Baptist, MD   PREOPERATIVE DIAGNOSES:  1.  Right chronic frontal and ethmoid sinusitis 2.  Chronic headaches.  POSTOPERATIVE DIAGNOSES:  1.  Right chronic frontal and ethmoid sinusitis 2.  Chronic headaches.  PROCEDURE PERFORMED:  1.  Right endoscopic frontal sinusotomy and total ethmoidectomy 2.  FUSION stereotactic image guidance.  ANESTHESIA: General endotracheal tube anesthesia.   COMPLICATIONS: None.   ESTIMATED BLOOD LOSS: 150 mL.   INDICATION FOR PROCEDURE: [name] is a 63 y.o. female with a history of chronic rhinosinusitis and chronic headaches. The patient was previously treated with multiple antibiotics, antihistamine, decongestant, steroid nasal spray, and systemic steroids. However, the patient continued to be symptomatic.  Her MRI and CT scan showed significant right frontal and right ethmoid sinus diseases.  Based on the above findings, the decision was made for the patient to undergo the above-stated procedures. The risks, benefits, alternatives, and details of the procedures were discussed with the patient. Questions were invited and answered. Informed consent was obtained.   DESCRIPTION OF PROCEDURE: The patient was taken to the operating room and placed supine on the operating table. General endotracheal tube anesthesia was administered by the anesthesiologist. The patient was positioned, and prepped and draped in the standard fashion for nasal surgery. Pledgets soaked with Afrin were placed in both nasal cavities for decongestion. The pledgets were subsequently removed. The FUSION stereotactic image guidance marker was placed. The image guidance system was functional throughout the case.  Using a 0 endoscope, the right nasal cavity was examined. An enlarged middle turbinate was noted. The anterior portion of the right middle turbinate was resected with a Tru-Cut forceps.  Polypoid tissue was noted within  the middle meatus. The polypoid tissue was removed using a combination of microdebrider and Blakesley forceps. The uncinate process was resected with a freer elevator. The bony partitions of the anterior and posterior ethmoid cavities were taken down. Polypoid tissue was noted and removed. Attention was then focused on the frontal sinus. The frontal recess was identified and enlarged by removing the surrounding bony partitions. Polypoid tissue and clay like substance were removed from the frontal recess.  The frontal sinus and the ethmoid cavities were copiously irrigated.  Nasopore packing was placed.  The care of the patient was turned over to the anesthesiologist. The patient was awakened from anesthesia without difficulty. The patient was extubated and transferred to the recovery room in good condition.   OPERATIVE FINDINGS: Chronic right frontal and ethmoid sinusitis.  Polypoid tissue was removed.  No suspicious mass or lesion was noted.  SPECIMEN: Right sinus contents.  FOLLOWUP CARE: The patient be discharged home once she is awake and alert. The patient will be placed on Percocet 1 tablets p.o. q.4 hours p.r.n. pain, and clindamycin for 3 days. The patient will follow up in my office in approximately 1 week.  Kal Chait Raynelle Bring, MD

## 2019-03-01 NOTE — Discharge Instructions (Addendum)
Post Anesthesia Home Care Instructions  Activity: Get plenty of rest for the remainder of the day. A responsible individual must stay with you for 24 hours following the procedure.  For the next 24 hours, DO NOT: -Drive a car -Operate machinery -Drink alcoholic beverages -Take any medication unless instructed by your physician -Make any legal decisions or sign important papers.  Meals: Start with liquid foods such as gelatin or soup. Progress to regular foods as tolerated. Avoid greasy, spicy, heavy foods. If nausea and/or vomiting occur, drink only clear liquids until the nausea and/or vomiting subsides. Call your physician if vomiting continues.  Special Instructions/Symptoms: Your throat may feel dry or sore from the anesthesia or the breathing tube placed in your throat during surgery. If this causes discomfort, gargle with warm salt water. The discomfort should disappear within 24 hours.  If you had a scopolamine patch placed behind your ear for the management of post- operative nausea and/or vomiting:  1. The medication in the patch is effective for 72 hours, after which it should be removed.  Wrap patch in a tissue and discard in the trash. Wash hands thoroughly with soap and water. 2. You may remove the patch earlier than 72 hours if you experience unpleasant side effects which may include dry mouth, dizziness or visual disturbances. 3. Avoid touching the patch. Wash your hands with soap and water after contact with the patch.    -----------------  POSTOPERATIVE INSTRUCTIONS FOR PATIENTS HAVING NASAL OR SINUS OPERATIONS ACTIVITY: Restrict activity at home for the first two days, resting as much as possible. Light activity is best. You may usually return to work within a week. You should refrain from nose blowing, strenuous activity, or heavy lifting greater than 20lbs for a total of one week after your operation.  If sneezing cannot be avoided, sneeze with your mouth  open. DISCOMFORT: You may experience a dull headache and pressure along with nasal congestion and discharge. These symptoms may be worse during the first week after the operation but may last as long as two to four weeks.  Please take Tylenol or the pain medication that has been prescribed for you. Do not take aspirin or aspirin containing medications since they may cause bleeding.  You may experience symptoms of post nasal drainage, nasal congestion, headaches and fatigue for two or three months after your operation.  BLEEDING: You may have some blood tinged nasal drainage for approximately two weeks after the operation.  The discharge will be worse for the first week.  Please call our office at [phone] or go to the nearest hospital emergency room if you experience any of the following: heavy, bright red blood from your nose or mouth that lasts longer than 15 minutes or coughing up or vomiting bright red blood or blood clots. GENERAL CONSIDERATIONS: 1. A gauze dressing will be placed on your upper lip to absorb any drainage after the operation. You may need to change this several times a day.  If you do not have very much drainage, you may remove the dressing.  Remember that you may gently wipe your nose with a tissue and sniff in, but DO NOT blow your nose. 2. Please keep all of your postoperative appointments.  Your final results after the operation will depend on proper follow-up.  The initial visit is usually 2 to 5 days after the operation.  During this visit, the remaining nasal packing and internal septal splints will be removed.  Your nasal and sinus cavities will   be cleaned.  During the second visit, your nasal and sinus cavities will be cleaned again. Have someone drive you to your first two postoperative appointments.  3. How you care for your nose after the operation will influence the results that you obtain.  You should follow all directions, take your medication as prescribed, and call  our office [phone] with any problems or questions. 4. You may be more comfortable sleeping with your head elevated on two pillows. 5. Do not take any medications that we have not prescribed or recommended. WARNING SIGNS: if any of the following should occur, please call our office: 1. Persistent fever greater than 102F. 2. Persistent vomiting. 3. Severe and constant pain that is not relieved by prescribed pain medication. 4. Trauma to the nose. 5. Rash or unusual side effects from any medicines.  

## 2019-03-01 NOTE — Transfer of Care (Signed)
Immediate Anesthesia Transfer of Care Note  Patient: [name]  Procedure(s) Performed: TOTAL ETHMOIDECTOMY (Right Nose) FRONTAL RECESS EXPLORATION (Right Nose) SINUS ENDOSCOPY WITH FUSION NAVIGATION (Right Nose)  Patient Location: PACU  Anesthesia Type:General  Level of Consciousness: awake, alert  and oriented  Airway & Oxygen Therapy: Patient Spontanous Breathing and Patient connected to face mask oxygen  Post-op Assessment: Report given to RN and Post -op Vital signs reviewed and stable  Post vital signs: Reviewed and stable  Last Vitals:  Vitals Value Taken Time  BP    Temp    Pulse 74 03/01/19 1132  Resp 13 03/01/19 1132  SpO2 93 % 03/01/19 1132  Vitals shown include unvalidated device data.  Last Pain:  Vitals:   03/01/19 0857  PainSc: 3       Patients Stated Pain Goal: 3 (93/81/01 7510)  Complications: No apparent anesthesia complications

## 2019-03-01 NOTE — H&P (Signed)
Cc: Chronic right frontal and ethmoid sinusitis  HPI: The patient is a 63 year old female who returns today for her follow-up evaluation. The patient was last seen in 09/2018.  At that time, she was noted to have chronic right frontal and ethmoid sinusitis on her MRI scan. She also has a history of significant Chiari malformation, with tonsillar herniation into the cervical canal. She underwent a craniotomy surgery with repair of her Chiari malformation.  The patient also underwent a sinus CT scan in 11/2018.  The CT showed complete opacification of the right frontal sinus as well as the right ethmoid sinus.  The CT and MRI results are concerning for possible obstructing mass in the right ethmoid sinus.  The patient returns today complaining of occasional frontal headache.  She denies any fever or visual change.  She was previously treated with antibiotics without improvement in her symptoms.   Exam: The nasal cavities were decongested and anesthetised with a combination of oxymetazoline and 4% lidocaine solution.  The flexible scope was inserted into the right nasal cavity.  Endoscopy of the inferior and middle meatus was performed.  Edematous mucosa was noted.  Nasopharynx was clear.  Turbinates were hypertrophied but without mass.  The procedure was repeated on the contralateral side with similar findings.  The patient tolerated the procedure well.  Instructions were given to avoid eating or drinking for 2 hours.    Assessment: Chronic right frontal and ethmoid sinusitis, with complete opacification of the right frontal sinus and partial opacification of the right ethmoid sinus.  The CT and MRI results are concerning for possible obstructing mass in the right ethmoid sinus.   Plan: 1.  The nasal endoscopy findings and the CT images are extensively reviewed with the patient.  2.  Based on the above findings, the patient will likely benefit from undergoing endoscopic sinus surgery to treat the right  ethmoid and frontal sinuses.  Any obstructing mass can be removed at that time.  The risks, benefits, alternatives and details of the procedure are reviewed with the patient.  3.  The patient would like to proceed with the procedure.

## 2019-03-02 ENCOUNTER — Encounter (HOSPITAL_BASED_OUTPATIENT_CLINIC_OR_DEPARTMENT_OTHER): Payer: Self-pay | Admitting: Otolaryngology

## 2019-03-05 ENCOUNTER — Encounter: Payer: Self-pay | Admitting: Family Medicine

## 2019-03-08 ENCOUNTER — Ambulatory Visit (INDEPENDENT_AMBULATORY_CARE_PROVIDER_SITE_OTHER): Payer: 59 | Admitting: Otolaryngology

## 2019-03-10 ENCOUNTER — Encounter: Payer: Self-pay | Admitting: *Deleted

## 2019-03-11 ENCOUNTER — Other Ambulatory Visit: Payer: Self-pay | Admitting: Pain Medicine

## 2019-03-11 ENCOUNTER — Ambulatory Visit (INDEPENDENT_AMBULATORY_CARE_PROVIDER_SITE_OTHER): Payer: 59 | Admitting: Otolaryngology

## 2019-03-11 ENCOUNTER — Other Ambulatory Visit: Payer: Self-pay

## 2019-03-11 DIAGNOSIS — J322 Chronic ethmoidal sinusitis: Secondary | ICD-10-CM

## 2019-03-11 DIAGNOSIS — M545 Low back pain, unspecified: Secondary | ICD-10-CM

## 2019-03-11 DIAGNOSIS — J338 Other polyp of sinus: Secondary | ICD-10-CM | POA: Diagnosis not present

## 2019-03-11 DIAGNOSIS — G8929 Other chronic pain: Secondary | ICD-10-CM

## 2019-03-11 DIAGNOSIS — J321 Chronic frontal sinusitis: Secondary | ICD-10-CM

## 2019-03-12 ENCOUNTER — Encounter: Payer: Self-pay | Admitting: *Deleted

## 2019-03-16 ENCOUNTER — Other Ambulatory Visit: Payer: Self-pay | Admitting: *Deleted

## 2019-03-16 MED ORDER — ATORVASTATIN CALCIUM 40 MG PO TABS: 40.0000 mg | ORAL_TABLET | Freq: Every day | ORAL | 3 refills | Status: DC

## 2019-03-16 MED ORDER — POTASSIUM CHLORIDE CRYS ER 20 MEQ PO TBCR: 20.0000 meq | EXTENDED_RELEASE_TABLET | Freq: Two times a day (BID) | ORAL | 3 refills | Status: DC

## 2019-03-16 MED ORDER — VERAPAMIL HCL ER 240 MG PO TBCR: 240.0000 mg | EXTENDED_RELEASE_TABLET | Freq: Every day | ORAL | 3 refills | Status: DC

## 2019-03-16 MED ORDER — VENLAFAXINE HCL 50 MG PO TABS: 50.0000 mg | ORAL_TABLET | Freq: Two times a day (BID) | ORAL | 3 refills | Status: DC

## 2019-03-25 ENCOUNTER — Ambulatory Visit: Payer: 59 | Admitting: Family Medicine

## 2019-03-25 ENCOUNTER — Ambulatory Visit (INDEPENDENT_AMBULATORY_CARE_PROVIDER_SITE_OTHER): Payer: 59 | Admitting: Otolaryngology

## 2019-03-25 DIAGNOSIS — J338 Other polyp of sinus: Secondary | ICD-10-CM

## 2019-03-25 DIAGNOSIS — J321 Chronic frontal sinusitis: Secondary | ICD-10-CM

## 2019-03-25 DIAGNOSIS — J322 Chronic ethmoidal sinusitis: Secondary | ICD-10-CM

## 2019-03-31 ENCOUNTER — Encounter: Payer: Self-pay | Admitting: Family Medicine

## 2019-03-31 ENCOUNTER — Ambulatory Visit (INDEPENDENT_AMBULATORY_CARE_PROVIDER_SITE_OTHER): Payer: 59 | Admitting: Family Medicine

## 2019-03-31 ENCOUNTER — Other Ambulatory Visit: Payer: Self-pay

## 2019-03-31 VITALS — BP 114/78 | HR 85 | Temp 98.2°F | Resp 16 | Ht 64.0 in | Wt 173.6 lb

## 2019-03-31 DIAGNOSIS — R42 Dizziness and giddiness: Secondary | ICD-10-CM | POA: Diagnosis not present

## 2019-03-31 DIAGNOSIS — G894 Chronic pain syndrome: Secondary | ICD-10-CM | POA: Diagnosis not present

## 2019-03-31 DIAGNOSIS — Z23 Encounter for immunization: Secondary | ICD-10-CM

## 2019-03-31 DIAGNOSIS — R4189 Other symptoms and signs involving cognitive functions and awareness: Secondary | ICD-10-CM

## 2019-03-31 DIAGNOSIS — R252 Cramp and spasm: Secondary | ICD-10-CM

## 2019-03-31 DIAGNOSIS — Z1211 Encounter for screening for malignant neoplasm of colon: Secondary | ICD-10-CM

## 2019-03-31 DIAGNOSIS — Z8669 Personal history of other diseases of the nervous system and sense organs: Secondary | ICD-10-CM

## 2019-03-31 DIAGNOSIS — F339 Major depressive disorder, recurrent, unspecified: Secondary | ICD-10-CM

## 2019-03-31 DIAGNOSIS — M797 Fibromyalgia: Secondary | ICD-10-CM

## 2019-03-31 DIAGNOSIS — Z1212 Encounter for screening for malignant neoplasm of rectum: Secondary | ICD-10-CM

## 2019-03-31 NOTE — Patient Instructions (Addendum)
Please return in January 2021 for your annual complete physical; please come fasting.  We will call you with information regarding your referral appointment. Neurology.  If you do not hear from us within the next 2 weeks, please let me know. It can take 1-2 weeks to get appointments set up with the specialists.   Please call to get your mammogram scheduled.   I recommend the Cologuard test for your colon cancer screening that is due. I have ordered this test for you. The Cologuard company will soon contact you to verify your insurance, address etc. They will then send you the kit; follow the instructions in the kit and return the kit to [employer]. They will run the test and send the results to me. I will then give you the results. If this test is negative, we recommend repeating a colon cancer screening test in 3 years. If it is positive, I will refer you to a Gastroenterologist so you can get set up for the recommended colonoscopy.  Thank you!  Today you were given your flu vaccination.   If you have any questions or concerns, please don't hesitate to send me a message via MyChart or call the office at [phone]. Thank you for visiting with us today! It's our pleasure caring for you.   

## 2019-03-31 NOTE — Progress Notes (Signed)
Subjective  CC:  Chief Complaint  Patient presents with  . Depression    Effexor increase is working well  . Pain    Saw pain management 8/31, has a f/u 9/28    HPI: [name] is a 63 y.o. female who presents to the office today to address the problems listed above in the chief complaint.  F/u on multiple complaints. See last visit notes.   Depression: increased effexor and feeling a little better. Seems that her frame of mind is more positive; however still suffering from lack of motivation and "foggy headed".   Chronic pain: seeing Dr. Jordan Likes now and is hopeful. Changed meds to nucynta. Less nausea.   Neuro concerns: still with cognitive concerns. C/o "jerks" all the time and has for some time. Denies anxiety or tremor. C/o dizziness, persistent. No paresis. Neurosurgery "dismissed my symptoms"   HM: still needs to schedule mammogram. Defers colonoscopy. avg risk.  Assessment  1. Chronic pain syndrome   2. Fibromyalgia   3. Cognitive change   4. Dizziness   5. History of Chiari malformation   6. Jerking movements of extremities   7. Major depression, recurrent, chronic (HCC)   8. Screening for colorectal cancer      Plan   depression:  Continue effexor.   Chronic pain: per pain clinic.   Neuro concerns: get input from neurology. Pt now able to work due to sxs. Referral placed.   HM: cologuard ordered. Pt to schedule mammogram.    Follow up: Return in about 4 months (around 07/31/2019) for complete physical.  Visit date not found  Orders Placed This Encounter  Procedures  . Cologuard  . Ambulatory referral to Neurology   No orders of the defined types were placed in this encounter.     I reviewed the patients updated PMH, FH, and SocHx.    Patient Active Problem List   Diagnosis Date Noted  . Chronic prescription benzodiazepine use 10/23/2018    Priority: High  . Chiari malformation type I (HCC) 10/23/2018  . Chronic narcotic use 06/19/2018  .  Hypokalemia 06/19/2018  . Migraine without aura and without status migrainosus, not intractable 06/19/2018  . Fibromyalgia 12/01/2012  . Major depression, recurrent, chronic (HCC) 05/21/2010  . Dyslipidemia 12/13/2008  . Collagenous colitis 01/01/2008  . Chronic pain 12/02/2007  . Essential hypertension 04/28/2007  . History of recurrent UTI (urinary tract infection) 04/28/2007  . Nephrolithiasis 04/28/2007   Current Meds  Medication Sig  . atorvastatin (LIPITOR) 40 MG tablet Take 1 tablet (40 mg total) by mouth daily.  . baclofen (LIORESAL) 10 MG tablet Take 10 mg by mouth 2 (two) times daily as needed (spasms/headaches.).   Marland Kitchen Cholecalciferol (VITAMIN D) 1000 UNITS capsule Take 1,000 Units by mouth daily.    Marland Kitchen loperamide (IMODIUM) 2 MG capsule Take 1 capsule (2 mg total) by mouth 4 (four) times daily as needed. (Patient taking differently: Take 2 mg by mouth 4 (four) times daily as needed (collagenous colitis). )  . potassium chloride SA (K-DUR) 20 MEQ tablet Take 1 tablet (20 mEq total) by mouth 2 (two) times daily.  Marland Kitchen scopolamine (TRANSDERM-SCOP) 1 MG/3DAYS PLEASE SEE ATTACHED FOR DETAILED DIRECTIONS  . venlafaxine (EFFEXOR) 50 MG tablet Take 1 tablet (50 mg total) by mouth 2 (two) times daily.  . verapamil (CALAN-SR) 240 MG CR tablet Take 1 tablet (240 mg total) by mouth at bedtime.    Allergies: Patient is allergic to codeine sulfate; moxifloxacin; penicillins; sulfonamide derivatives;  biaxin [clarithromycin]; diphenhydramine hcl; and tramadol hcl. Family History: Patient family history includes Alcohol abuse in her father; Arrhythmia in her daughter; Healthy in her sister; Hyperlipidemia in her brother, brother, daughter, and mother; Hypertension in her brother, brother, and mother; Parkinson's disease in her mother; Polycystic ovary syndrome in her daughter. Social History:  Patient  reports that she quit smoking about 11 years ago. Her smoking use included cigarettes. She smoked  1.00 pack per day. She has never used smokeless tobacco. She reports that she does not drink alcohol or use drugs.  Review of Systems: Constitutional: Negative for fever malaise or anorexia Cardiovascular: negative for chest pain Respiratory: negative for SOB or persistent cough Gastrointestinal: negative for abdominal pain  Objective  Vitals: BP 114/78   Pulse 85   Temp 98.2 F (36.8 C) (Tympanic)   Resp 16   Ht 5\' 4"  (1.626 m)   Wt 173 lb 9.6 oz (78.7 kg)   SpO2 95%   BMI 29.80 kg/m  General: no acute distress , A&Ox3 Psych:tearful, normal speech sad affect      Commons side effects, risks, benefits, and alternatives for medications and treatment plan prescribed today were discussed, and the patient expressed understanding of the given instructions. Patient is instructed to call or message via MyChart if he/she has any questions or concerns regarding our treatment plan. No barriers to understanding were identified. We discussed Red Flag symptoms and signs in detail. Patient expressed understanding regarding what to do in case of urgent or emergency type symptoms.   Medication list was reconciled, printed and provided to the patient in AVS. Patient instructions and summary information was reviewed with the patient as documented in the AVS. This note was prepared with assistance of Dragon voice recognition software. Occasional wrong-word or sound-a-like substitutions may have occurred due to the inherent limitations of voice recognition software

## 2019-04-02 ENCOUNTER — Other Ambulatory Visit: Payer: Self-pay | Admitting: Pain Medicine

## 2019-04-04 ENCOUNTER — Encounter: Payer: Self-pay | Admitting: Family Medicine

## 2019-04-05 ENCOUNTER — Ambulatory Visit
Admission: RE | Admit: 2019-04-05 | Discharge: 2019-04-05 | Disposition: A | Discharge status: Home / Self Care | Payer: 59 | Source: Ambulatory Visit | Attending: Pain Medicine | Admitting: Pain Medicine

## 2019-04-05 ENCOUNTER — Other Ambulatory Visit: Payer: Self-pay

## 2019-04-05 DIAGNOSIS — G8929 Other chronic pain: Secondary | ICD-10-CM

## 2019-04-05 DIAGNOSIS — M545 Low back pain, unspecified: Secondary | ICD-10-CM

## 2019-04-19 ENCOUNTER — Encounter: Payer: Self-pay | Admitting: Family Medicine

## 2019-04-20 NOTE — Telephone Encounter (Signed)
This is not a medication I use long-term. If he would like you to be on it chronically, Dr. Kathyrn Sheriff will need to manage it. Please contact his office. Thanks.

## 2019-04-21 ENCOUNTER — Other Ambulatory Visit: Payer: Self-pay | Admitting: Family Medicine

## 2019-04-21 DIAGNOSIS — Z1231 Encounter for screening mammogram for malignant neoplasm of breast: Secondary | ICD-10-CM

## 2019-05-03 ENCOUNTER — Ambulatory Visit
Admission: RE | Admit: 2019-05-03 | Discharge: 2019-05-03 | Disposition: A | Discharge status: Home / Self Care | Payer: 59 | Source: Ambulatory Visit | Attending: Pain Medicine | Admitting: Pain Medicine

## 2019-05-03 ENCOUNTER — Other Ambulatory Visit: Payer: Self-pay | Admitting: Pain Medicine

## 2019-05-03 ENCOUNTER — Other Ambulatory Visit: Payer: Self-pay

## 2019-05-03 DIAGNOSIS — M25561 Pain in right knee: Secondary | ICD-10-CM

## 2019-05-03 DIAGNOSIS — M25562 Pain in left knee: Secondary | ICD-10-CM

## 2019-05-11 LAB — COLOGUARD: Cologuard: NEGATIVE

## 2019-05-14 ENCOUNTER — Telehealth: Payer: Self-pay | Admitting: Family Medicine

## 2019-05-14 NOTE — Telephone Encounter (Signed)
FYI do you need me to call?

## 2019-05-14 NOTE — Telephone Encounter (Signed)
See note

## 2019-05-14 NOTE — Telephone Encounter (Signed)
Dr. Renelda Mom called in to have a peer to peer with Dr. Jonni Sanger for pt short term disability    Please call back at: [phone] Report is due by EOD 05/17/2019 if he doesn't hear back form provider he will complete based on medical records

## 2019-05-14 NOTE — Telephone Encounter (Signed)
You can if you want. I only know what I put in the chart.

## 2019-05-18 ENCOUNTER — Encounter: Payer: Self-pay | Admitting: Family Medicine

## 2019-05-18 ENCOUNTER — Ambulatory Visit: Payer: 59 | Admitting: Neurology

## 2019-05-18 ENCOUNTER — Encounter: Payer: Self-pay | Admitting: Neurology

## 2019-05-18 ENCOUNTER — Other Ambulatory Visit: Payer: Self-pay

## 2019-05-18 VITALS — BP 119/79 | HR 98 | Temp 97.5°F | Ht 64.0 in | Wt 172.5 lb

## 2019-05-18 DIAGNOSIS — IMO0002 Reserved for concepts with insufficient information to code with codable children: Secondary | ICD-10-CM | POA: Insufficient documentation

## 2019-05-18 DIAGNOSIS — G43709 Chronic migraine without aura, not intractable, without status migrainosus: Secondary | ICD-10-CM | POA: Diagnosis not present

## 2019-05-18 DIAGNOSIS — R413 Other amnesia: Secondary | ICD-10-CM | POA: Insufficient documentation

## 2019-05-18 DIAGNOSIS — F329 Major depressive disorder, single episode, unspecified: Secondary | ICD-10-CM | POA: Diagnosis not present

## 2019-05-18 DIAGNOSIS — Q0700 Arnold-Chiari syndrome without spina bifida or hydrocephalus: Secondary | ICD-10-CM | POA: Diagnosis not present

## 2019-05-18 DIAGNOSIS — F32A Depression, unspecified: Secondary | ICD-10-CM

## 2019-05-18 MED ORDER — ELETRIPTAN HYDROBROMIDE 20 MG PO TABS: 20.0000 mg | ORAL_TABLET | ORAL | 3 refills | Status: DC | PRN

## 2019-05-18 NOTE — Progress Notes (Signed)
PATIENT: [name] DOB: 05/22/1956  Chief Complaint  Patient presents with  . Hx of Chiari Malformation    MMSE 29/30 - 15 animals. Concerns over cognitive changes, word finding difficulty, fatigue, intermittent dizziness, headaches (behind right eye & back of head) and involuntary jerking movements of extremities.   Marland Kitchen PCP    Willow Ora, MD     HISTORICAL  [name] is a 63 year old female, seen in request by her primary care physician Dr. Mardelle Matte, [place] for evaluation of constellation of complaints, word finding difficulties, fatigue, depression, cognitive changes, frequent headaches, initial evaluation was on May 18, 2019.  I have reviewed and summarized the referring note from the referring physician.  She has past medical history of hyperlipidemia, fibromyalgia, she complains of a year history of pressure type headaches, did have a history of migraine in the past, she began to experience different kind of headaches, with sudden movement she experienced explosive transient headache involving the vertex region, getting worse when she leans over, cough, or straining anyway.  MRI of the brain in February 2020 reviewed significant Arnold-Chiari 1 malformation, tonsillar herniation with impact into the cervical canal, 11 mm of tonsillar descent greater on the right, no hydrocephalus  She eventually underwent suboccipital craniotomy with complete C1 laminectomy and expansile duraplasty by Dr. Conchita Paris on December 11, 2018, surgery did help her positional related to transient sharp headache  She remains symptomatic otherwise, planes of feeling worsening depression, fatigue, memory loss, difficulty concentrating, gait abnormality, Mini-Mental Status Examination 29/30, naming of 15 today,   I personally reviewed CT head without contrast on December 12, 2018, status post decompressive occipital and C1 laminectomy for Arnold-Chiari malformation, small volume pneumocephalus without acute  abnormalities.  MRI in September 2020, mild degenerative changes, there was no significant canal or foraminal narrowing.  Laboratory evaluations in 2020 showed elevated WBC 15.9, elevated glucose 159, creatinine 1.09, normal TSH, negative HIV,  REVIEW OF SYSTEMS: Full 14 system review of systems performed and notable only for as above All other review of systems were negative.  ALLERGIES: Allergies  Allergen Reactions  . Codeine Sulfate Hives and Itching  . Moxifloxacin Hives and Itching  . Penicillins Hives and Itching    Tolerates cephalosporins Did it involve swelling of the face/tongue/throat, SOB, or low BP? No Did it involve sudden or severe rash/hives, skin peeling, or any reaction on the inside of your mouth or nose? No Did you need to seek medical attention at a hospital or doctor's office? No When did it last happen? If all above answers are "NO", may proceed with cephalosporin use.   . Sulfonamide Derivatives Hives and Rash  . Biaxin [Clarithromycin] Nausea Only    Metallic taste  . Diphenhydramine Hcl Other (See Comments)    side effect of being irritable  . Tramadol Hcl Nausea And Vomiting    "Doesn't agree with my system." Does not help pain and causes vomiting.    HOME MEDICATIONS: Current Outpatient Medications  Medication Sig Dispense Refill  . atorvastatin (LIPITOR) 40 MG tablet Take 1 tablet (40 mg total) by mouth daily. 90 tablet 3  . baclofen (LIORESAL) 10 MG tablet Take 10 mg by mouth 2 (two) times daily as needed (spasms/headaches.).     Marland Kitchen Cholecalciferol (VITAMIN D) 1000 UNITS capsule Take 1,000 Units by mouth daily.      Marland Kitchen loperamide (IMODIUM) 2 MG capsule Take 1 capsule (2 mg total) by mouth 4 (four) times daily as needed. (Patient taking  differently: Take 2 mg by mouth 4 (four) times daily as needed (collagenous colitis). ) 30 capsule 6  . NUCYNTA 50 MG tablet Take 50 mg by mouth 3 (three) times daily as needed.    . potassium chloride SA  (K-DUR) 20 MEQ tablet Take 1 tablet (20 mEq total) by mouth 2 (two) times daily. 180 tablet 3  . promethazine (PHENERGAN) 25 MG tablet Take 1 tablet (25 mg total) by mouth every 6 (six) hours as needed. for nausea 360 tablet 2  . venlafaxine (EFFEXOR) 50 MG tablet Take 1 tablet (50 mg total) by mouth 2 (two) times daily. 180 tablet 3  . verapamil (CALAN-SR) 240 MG CR tablet Take 1 tablet (240 mg total) by mouth at bedtime. 90 tablet 3   No current facility-administered medications for this visit.     PAST MEDICAL HISTORY: Past Medical History:  Diagnosis Date  . Anxiety   . Bladder stones   . Bursitis of shoulder   . Chiari malformation type I (HCC)   . Chronic prescription benzodiazepine use 10/23/2018  . 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 DEPRESSIVE DISORDER 05/21/2010  . Fibromyalgia   . History of kidney stones   . HYPERLIPIDEMIA 12/13/2008  . HYPERTENSION 04/28/2007  . LOW BACK PAIN 12/02/2007  . Migraine   . Migraine without aura and without status migrainosus, not intractable 06/19/2018  . NEPHROLITHIASIS, HX OF 04/28/2007  . PONV (postoperative nausea and vomiting)   . Raynaud disease   . Renal colic 02/12/2008  . UTI'S, RECURRENT 04/28/2007    PAST SURGICAL HISTORY: Past Surgical History:  Procedure Laterality Date  . ABDOMINAL HYSTERECTOMY  1990   fiboids, prolapse  . colonoscopy    . ETHMOIDECTOMY Right 03/01/2019   Procedure: TOTAL ETHMOIDECTOMY;  Surgeon: Newman Pies, MD;  Location: [facility];  Service: ENT;  Laterality: Right;  . FRONTAL SINUS EXPLORATION Right 03/01/2019   Procedure: FRONTAL RECESS EXPLORATION;  Surgeon: Newman Pies, MD;  Location: [facility];  Service: ENT;  Laterality: Right;  . SINUS ENDO WITH FUSION Right 03/01/2019   Procedure: SINUS ENDOSCOPY WITH FUSION NAVIGATION;  Surgeon: Newman Pies, MD;  Location: [facility];  Service: ENT;  Laterality: Right;  . SUBOCCIPITAL CRANIECTOMY CERVICAL LAMINECTOMY N/A 12/11/2018    Procedure: Chiari Decompression;  Surgeon: Lisbeth Renshaw, MD;  Location: [facility];  Service: Neurosurgery;  Laterality: N/A;  posterior/ suboccipital  . WISDOM TOOTH EXTRACTION      FAMILY HISTORY: Family History  Problem Relation Age of Onset  . Hyperlipidemia Mother   . Hypertension Mother   . Parkinson's disease Mother   . Alcohol abuse Father   . Suicidality Father   . Healthy Sister   . Hyperlipidemia Brother   . Arrhythmia Daughter   . Hyperlipidemia Daughter   . Polycystic ovary syndrome Daughter   . Hypertension Brother   . Hyperlipidemia Brother   . Hypertension Brother   . Diabetes Neg Hx   . Cancer Neg Hx     SOCIAL HISTORY: Social History   Socioeconomic History  . Marital status: Married    Spouse name: disabled Administrator, Civil Service  . Number of children: 2  . Years of education: some college  . Highest education level: Not on file  Occupational History  . Occupation: Tourist information centre manager    Comment: [employer]  Social Needs  . Financial resource strain: Not on file  . Food insecurity    Worry: Not on file    Inability: Not on file  .  Transportation needs    Medical: Not on file    Non-medical: Not on file  Tobacco Use  . Smoking status: Former Smoker    Packs/day: 1.00    Types: Cigarettes    Quit date: 2013    Years since quitting: 7.8  . Smokeless tobacco: Never Used  Substance and Sexual Activity  . Alcohol use: No  . Drug use: No  . Sexual activity: Yes  Lifestyle  . Physical activity    Days per week: Not on file    Minutes per session: Not on file  . Stress: Not on file  Relationships  . Social Herbalist on phone: Not on file    Gets together: Not on file    Attends religious service: Not on file    Active member of club or organization: Not on file    Attends meetings of clubs or organizations: Not on file    Relationship status: Not on file  . Intimate partner violence    Fear of current or ex partner: Not on file    Emotionally abused:  Not on file    Physically abused: Not on file    Forced sexual activity: Not on file  Other Topics Concern  . Not on file  Social History Narrative   Lives at home with husband.   Right-handed.   No daily caffeine use.     PHYSICAL EXAM   Vitals:   05/18/19 1348  BP: 119/79  Pulse: 98  Temp: (!) 97.5 F (36.4 C)  Weight: 172 lb 8 oz (78.2 kg)  Height: 5\' 4"  (1.626 m)    Not recorded      Body mass index is 29.61 kg/m.  PHYSICAL EXAMNIATION: Depressed looking miiddle-aged female  Gen: NAD, conversant, well nourised, well groomed                     Cardiovascular: Regular rate rhythm, no peripheral edema, warm, nontender. Eyes: Conjunctivae clear without exudates or hemorrhage Neck: Supple, no carotid bruits. Pulmonary: Clear to auscultation bilaterally   NEUROLOGICAL EXAM:  MMSE - Mini Mental State Exam 05/18/2019  Orientation to time 5  Orientation to Place 5  Registration 3  Attention/ Calculation 5  Recall 2  Language- name 2 objects 2  Language- repeat 1  Language- follow 3 step command 3  Language- read & follow direction 1  Write a sentence 1  Copy design 1  Total score 29  Animal naming 15   CRANIAL NERVES: CN II: Visual fields are full to confrontation.  Pupils are round equal and briskly reactive to light. CN III, IV, VI: extraocular movement are normal. No ptosis. CN V: Facial sensation is intact to pinprick in all 3 divisions bilaterally. Corneal responses are intact.  CN VII: Face is symmetric with normal eye closure and smile. CN VIII: Hearing is normal to causal conversation. CN IX, X: Palate elevates symmetrically. Phonation is normal. CN XI: Head turning and shoulder shrug are intact CN XII: Tongue is midline with normal movements and no atrophy.  MOTOR: There is no pronator drift of out-stretched arms. Muscle bulk and tone are normal. Muscle strength is normal.  REFLEXES: Reflexes are 2+ and symmetric at the biceps, triceps, knees,  and ankles. Plantar responses are flexor.  SENSORY: Intact to light touch, pinprick, positional sensation and vibratory sensation are intact in fingers and toes.  COORDINATION: Rapid alternating movements and fine finger movements are intact. There is no dysmetria  on finger-to-nose and heel-knee-shin.    GAIT/STANCE: Posture is normal. Gait is steady with normal steps, base, arm swing, and turning   DIAGNOSTIC DATA (LABS, IMAGING, TESTING) - I reviewed patient records, labs, notes, testing and imaging myself where available.   ASSESSMENT AND PLAN  [name] is a 63 y.o. female   History of Arnold-Chiari malformation, status post suboccipital craniectomy, C1 laminectomy in June 2020 Constellation of complaints, including memory loss Chronic migraine headaches  B12 level,  Relpax as needed for pain  Continue to work with her primary care physician for better control of depression   Levert FeinsteinYijun Lavonia Eager, M.D. Ph.D.  [facility] [address], [address] [place], [place] [address] Ph: [phone] Fax: [phone]  CC: Willow OraAndy, Camille L, MD

## 2019-05-19 ENCOUNTER — Other Ambulatory Visit: Payer: Self-pay

## 2019-05-19 ENCOUNTER — Ambulatory Visit
Admission: RE | Admit: 2019-05-19 | Discharge: 2019-05-19 | Disposition: A | Discharge status: Home / Self Care | Payer: 59 | Source: Ambulatory Visit | Attending: Family Medicine | Admitting: Family Medicine

## 2019-05-19 DIAGNOSIS — Z1231 Encounter for screening mammogram for malignant neoplasm of breast: Secondary | ICD-10-CM

## 2019-05-19 LAB — VITAMIN B12: Vitamin B-12: 292 pg/mL (ref 232–1245)

## 2019-05-20 ENCOUNTER — Encounter: Payer: Self-pay | Admitting: Family Medicine

## 2019-05-21 ENCOUNTER — Other Ambulatory Visit: Payer: Self-pay | Admitting: Family Medicine

## 2019-05-21 DIAGNOSIS — R928 Other abnormal and inconclusive findings on diagnostic imaging of breast: Secondary | ICD-10-CM

## 2019-05-25 ENCOUNTER — Encounter: Payer: Self-pay | Admitting: Family Medicine

## 2019-05-26 ENCOUNTER — Other Ambulatory Visit: Payer: Self-pay

## 2019-05-26 ENCOUNTER — Ambulatory Visit: Payer: 59

## 2019-05-26 ENCOUNTER — Ambulatory Visit
Admission: RE | Admit: 2019-05-26 | Discharge: 2019-05-26 | Disposition: A | Discharge status: Home / Self Care | Payer: 59 | Source: Ambulatory Visit | Attending: Family Medicine | Admitting: Family Medicine

## 2019-05-26 DIAGNOSIS — R928 Other abnormal and inconclusive findings on diagnostic imaging of breast: Secondary | ICD-10-CM

## 2019-05-27 ENCOUNTER — Other Ambulatory Visit: Payer: Self-pay | Admitting: Pain Medicine

## 2019-05-27 DIAGNOSIS — G8929 Other chronic pain: Secondary | ICD-10-CM

## 2019-06-21 ENCOUNTER — Other Ambulatory Visit: Payer: 59

## 2019-06-29 ENCOUNTER — Other Ambulatory Visit: Payer: Self-pay | Admitting: Family Medicine

## 2019-07-09 HISTORY — PX: MRI: SHX5353

## 2019-08-06 ENCOUNTER — Other Ambulatory Visit: Payer: Self-pay | Admitting: Pain Medicine

## 2019-08-06 DIAGNOSIS — G8929 Other chronic pain: Secondary | ICD-10-CM

## 2019-08-06 DIAGNOSIS — M549 Dorsalgia, unspecified: Secondary | ICD-10-CM

## 2019-08-12 ENCOUNTER — Ambulatory Visit
Admission: RE | Admit: 2019-08-12 | Discharge: 2019-08-12 | Disposition: A | Discharge status: Home / Self Care | Payer: Managed Care, Other (non HMO) | Source: Ambulatory Visit | Attending: Pain Medicine | Admitting: Pain Medicine

## 2019-08-12 DIAGNOSIS — M549 Dorsalgia, unspecified: Secondary | ICD-10-CM

## 2019-08-12 DIAGNOSIS — G8929 Other chronic pain: Secondary | ICD-10-CM

## 2019-08-20 ENCOUNTER — Other Ambulatory Visit: Payer: Self-pay

## 2019-08-20 DIAGNOSIS — E876 Hypokalemia: Secondary | ICD-10-CM

## 2019-08-24 ENCOUNTER — Other Ambulatory Visit: Payer: Self-pay

## 2019-08-24 DIAGNOSIS — E876 Hypokalemia: Secondary | ICD-10-CM

## 2019-08-24 MED ORDER — POTASSIUM CHLORIDE CRYS ER 20 MEQ PO TBCR: 20.0000 meq | EXTENDED_RELEASE_TABLET | Freq: Two times a day (BID) | ORAL | 0 refills | Status: DC

## 2019-08-24 NOTE — Telephone Encounter (Signed)
Please call patient. She is overdue for her physical. Please schedule. I have refilled her medication for 3 months. No further refills w/o OV or CPE. Thanks

## 2019-08-24 NOTE — Telephone Encounter (Signed)
LAST APPOINTMENT DATE: 03/31/2019 NEXT APPOINTMENT DATE:@Visit  date not found   LAST REFILL: 03/16/2019  QTY: 180 tablet

## 2019-08-25 NOTE — Telephone Encounter (Signed)
Called pt and LVM to schedule appt

## 2019-09-18 ENCOUNTER — Other Ambulatory Visit: Payer: Self-pay | Admitting: Physician Assistant

## 2019-09-18 DIAGNOSIS — R258 Other abnormal involuntary movements: Secondary | ICD-10-CM

## 2019-09-20 ENCOUNTER — Ambulatory Visit (INDEPENDENT_AMBULATORY_CARE_PROVIDER_SITE_OTHER): Payer: 59 | Admitting: Family Medicine

## 2019-09-20 ENCOUNTER — Encounter: Payer: Self-pay | Admitting: Family Medicine

## 2019-09-20 ENCOUNTER — Other Ambulatory Visit: Payer: Self-pay

## 2019-09-20 VITALS — BP 132/86 | HR 67 | Temp 97.8°F | Ht 64.0 in | Wt 169.4 lb

## 2019-09-20 DIAGNOSIS — Q0700 Arnold-Chiari syndrome without spina bifida or hydrocephalus: Secondary | ICD-10-CM

## 2019-09-20 DIAGNOSIS — Z79899 Other long term (current) drug therapy: Secondary | ICD-10-CM

## 2019-09-20 DIAGNOSIS — I10 Essential (primary) hypertension: Secondary | ICD-10-CM | POA: Diagnosis not present

## 2019-09-20 DIAGNOSIS — E876 Hypokalemia: Secondary | ICD-10-CM

## 2019-09-20 DIAGNOSIS — F339 Major depressive disorder, recurrent, unspecified: Secondary | ICD-10-CM

## 2019-09-20 DIAGNOSIS — Z0000 Encounter for general adult medical examination without abnormal findings: Secondary | ICD-10-CM | POA: Diagnosis not present

## 2019-09-20 DIAGNOSIS — G894 Chronic pain syndrome: Secondary | ICD-10-CM

## 2019-09-20 DIAGNOSIS — M797 Fibromyalgia: Secondary | ICD-10-CM

## 2019-09-20 DIAGNOSIS — E785 Hyperlipidemia, unspecified: Secondary | ICD-10-CM | POA: Diagnosis not present

## 2019-09-20 DIAGNOSIS — Z23 Encounter for immunization: Secondary | ICD-10-CM

## 2019-09-20 LAB — COMPREHENSIVE METABOLIC PANEL
ALT: 36 U/L — ABNORMAL HIGH (ref 0–35)
AST: 15 U/L (ref 0–37)
Albumin: 3.9 g/dL (ref 3.5–5.2)
Alkaline Phosphatase: 64 U/L (ref 39–117)
BUN: 21 mg/dL (ref 6–23)
CO2: 28 mEq/L (ref 19–32)
Calcium: 9.1 mg/dL (ref 8.4–10.5)
Chloride: 102 mEq/L (ref 96–112)
Creatinine, Ser: 1.07 mg/dL (ref 0.40–1.20)
GFR: 51.99 mL/min — ABNORMAL LOW (ref >60.00)
Glucose, Bld: 90 mg/dL (ref 70–99)
Potassium: 4.8 mEq/L (ref 3.5–5.1)
Sodium: 138 mEq/L (ref 135–145)
Total Bilirubin: 0.5 mg/dL (ref 0.2–1.2)
Total Protein: 6.7 g/dL (ref 6.0–8.3)

## 2019-09-20 LAB — CBC WITH DIFFERENTIAL/PLATELET
Basophils Absolute: 0.1 10*3/uL (ref 0.0–0.1)
Basophils Relative: 0.6 % (ref 0.0–3.0)
Eosinophils Absolute: 0.1 10*3/uL (ref 0.0–0.7)
Eosinophils Relative: 0.4 % (ref 0.0–5.0)
HCT: 40.4 % (ref 36.0–46.0)
Hemoglobin: 13.3 g/dL (ref 12.0–15.0)
Lymphocytes Relative: 24.5 % (ref 12.0–46.0)
Lymphs Abs: 2.9 10*3/uL (ref 0.7–4.0)
MCHC: 32.9 g/dL (ref 30.0–36.0)
MCV: 91.4 fl (ref 78.0–100.0)
Monocytes Absolute: 0.7 10*3/uL (ref 0.1–1.0)
Monocytes Relative: 5.7 % (ref 3.0–12.0)
Neutro Abs: 8.1 10*3/uL — ABNORMAL HIGH (ref 1.4–7.7)
Neutrophils Relative %: 68.8 % (ref 43.0–77.0)
Platelets: 293 10*3/uL (ref 150.0–400.0)
RBC: 4.42 Mil/uL (ref 3.87–5.11)
RDW: 14.4 % (ref 11.5–15.5)
WBC: 11.8 10*3/uL — ABNORMAL HIGH (ref 4.0–10.5)

## 2019-09-20 LAB — LIPID PANEL
Cholesterol: 189 mg/dL (ref 0–200)
HDL: 83.9 mg/dL (ref >39.00)
LDL Cholesterol: 90 mg/dL (ref 0–99)
NonHDL: 104.76
Total CHOL/HDL Ratio: 2
Triglycerides: 72 mg/dL (ref 0.0–149.0)
VLDL: 14.4 mg/dL (ref 0.0–40.0)

## 2019-09-20 LAB — TSH: TSH: 1.17 u[IU]/mL (ref 0.35–4.50)

## 2019-09-20 NOTE — Patient Instructions (Signed)
Please return in 6 months for hypertension follow up.  I will release your lab results to you on your MyChart account with further instructions. Please reply with any questions.   Today you were given your Tdap vaccination.   If you have any questions or concerns, please don't hesitate to send me a message via MyChart or call the office at [phone]. Thank you for visiting with [name] today! It's our pleasure caring for you.   Preventive Care 70-62 Years Old, Female Preventive care refers to visits with your health care provider and lifestyle choices that can promote health and wellness. This includes:  A yearly physical exam. This may also be called an annual well check.  Regular dental visits and eye exams.  Immunizations.  Screening for certain conditions.  Healthy lifestyle choices, such as eating a healthy diet, getting regular exercise, not using drugs or products that contain nicotine and tobacco, and limiting alcohol use. What can I expect for my preventive care visit? Physical exam Your health care provider will check your:  Height and weight. This may be used to calculate body mass index (BMI), which tells if you are at a healthy weight.  Heart rate and blood pressure.  Skin for abnormal spots. Counseling Your health care provider may ask you questions about your:  Alcohol, tobacco, and drug use.  Emotional well-being.  Home and relationship well-being.  Sexual activity.  Eating habits.  Work and work Statistician.  Method of birth control.  Menstrual cycle.  Pregnancy history. What immunizations do I need?  Influenza (flu) vaccine  This is recommended every year. Tetanus, diphtheria, and pertussis (Tdap) vaccine  You may need a Td booster every 10 years. Varicella (chickenpox) vaccine  You may need this if you have not been vaccinated. Zoster (shingles) vaccine  You may need this after age 29. Measles, mumps, and rubella (MMR) vaccine  You  may need at least one dose of MMR if you were born in 1957 or later. You may also need a second dose. Pneumococcal conjugate (PCV13) vaccine  You may need this if you have certain conditions and were not previously vaccinated. Pneumococcal polysaccharide (PPSV23) vaccine  You may need one or two doses if you smoke cigarettes or if you have certain conditions. Meningococcal conjugate (MenACWY) vaccine  You may need this if you have certain conditions. Hepatitis A vaccine  You may need this if you have certain conditions or if you travel or work in places where you may be exposed to hepatitis A. Hepatitis B vaccine  You may need this if you have certain conditions or if you travel or work in places where you may be exposed to hepatitis B. Haemophilus influenzae type b (Hib) vaccine  You may need this if you have certain conditions. Human papillomavirus (HPV) vaccine  If recommended by your health care provider, you may need three doses over 6 months. You may receive vaccines as individual doses or as more than one vaccine together in one shot (combination vaccines). Talk with your health care provider about the risks and benefits of combination vaccines. What tests do I need? Blood tests  Lipid and cholesterol levels. These may be checked every 5 years, or more frequently if you are over 57 years old.  Hepatitis C test.  Hepatitis B test. Screening  Lung cancer screening. You may have this screening every year starting at age 65 if you have a 30-pack-year history of smoking and currently smoke or have quit within the past  15 years.  Colorectal cancer screening. All adults should have this screening starting at age 38 and continuing until age 81. Your health care provider may recommend screening at age 50 if you are at increased risk. You will have tests every 1-10 years, depending on your results and the type of screening test.  Diabetes screening. This is done by checking your  blood sugar (glucose) after you have not eaten for a while (fasting). You may have this done every 1-3 years.  Mammogram. This may be done every 1-2 years. Talk with your health care provider about when you should start having regular mammograms. This may depend on whether you have a family history of breast cancer.  BRCA-related cancer screening. This may be done if you have a family history of breast, ovarian, tubal, or peritoneal cancers.  Pelvic exam and Pap test. This may be done every 3 years starting at age 59. Starting at age 44, this may be done every 5 years if you have a Pap test in combination with an HPV test. Other tests  Sexually transmitted disease (STD) testing.  Bone density scan. This is done to screen for osteoporosis. You may have this scan if you are at high risk for osteoporosis. Follow these instructions at home: Eating and drinking  Eat a diet that includes fresh fruits and vegetables, whole grains, lean protein, and low-fat dairy.  Take vitamin and mineral supplements as recommended by your health care provider.  Do not drink alcohol if: ? Your health care provider tells you not to drink. ? You are pregnant, may be pregnant, or are planning to become pregnant.  If you drink alcohol: ? Limit how much you have to 0-1 drink a day. ? Be aware of how much alcohol is in your drink. In the [place] one drink equals one 12 oz bottle of beer (355 mL), one 5 oz glass of wine (148 mL), or one 1 oz glass of hard liquor (44 mL). Lifestyle  Take daily care of your teeth and gums.  Stay active. Exercise for at least 30 minutes on 5 or more days each week.  Do not use any products that contain nicotine or tobacco, such as cigarettes, e-cigarettes, and chewing tobacco. If you need help quitting, ask your health care provider.  If you are sexually active, practice safe sex. Use a condom or other form of birth control (contraception) in order to prevent pregnancy and STIs  (sexually transmitted infections).  If told by your health care provider, take low-dose aspirin daily starting at age 65. What's next?  Visit your health care provider once a year for a well check visit.  Ask your health care provider how often you should have your eyes and teeth checked.  Stay up to date on all vaccines. This information is not intended to replace advice given to you by your health care provider. Make sure you discuss any questions you have with your health care provider. Document Revised: 03/05/2018 Document Reviewed: 03/05/2018 Elsevier Patient Education  2020 [employer].

## 2019-09-20 NOTE — Progress Notes (Signed)
Subjective  Chief Complaint  Patient presents with  . Annual Exam    fasting. Chiari surgery. pt reports diziness and HA  . Hyperlipidemia    takes atorvastatin. no side effects  . Hypertension    Readings <140/80 at home  . Depression    takes venlafaxine. can't tell a big difference of improvement    HPI: [name] is a 64 y.o. female who presents to [facility] [place] today for a Female Wellness Visit. She also has the concerns and/or needs as listed above in the chief complaint. These will be addressed in addition to the Health Maintenance Visit.   Wellness Visit: annual visit with health maintenance review and exam without Pap   Health maintenance: Current screening tests are up-to-date.  Works from home and is doing well in spite of Covid.  We will update her tetanus shot today. Chronic disease f/u and/or acute problem visit: (deemed necessary to be done in addition to the wellness visit):  Chronic pain: This is improved although she is having some neurologic problems with her right foot and worsening balance.  She has been referred back to her neurosurgeon for further evaluation.  She has an MRI scheduled.  She does report that pain is better controlled  Depression: On Effexor and stable.  Hypertension has been well controlled.  She denies chest pain, palpitations, shortness of breath or lower extremity edema.  She does well with her medications.  She is fasting for lab work today.  Hyperlipidemia: Due for recheck.  On statin.  No adverse effects.  History of Arnold-Chiari malformation status post surgery: Fortunately, she continues to improve.  Her memory, thinking, and overall energy level is better.  I did review the notes from the neurologist.  Hypokalemia stable on supplements due for recheck.  Assessment  1. Annual physical exam   2. Major depression, recurrent, chronic (HCC)   3. Fibromyalgia   4. Chronic pain syndrome   5. Essential  hypertension   6. Dyslipidemia   7. Arnold-Chiari malformation (HCC)   8. Chronic prescription benzodiazepine use   9. Hypokalemia   10. Need for Tdap vaccination      Plan  Female Wellness Visit:  Age appropriate Health Maintenance and Prevention measures were discussed with patient. Included topics are cancer screening recommendations, ways to keep healthy (see AVS) including dietary and exercise recommendations, regular eye and dental care, use of seat belts, and avoidance of moderate alcohol use and tobacco use.   BMI: discussed patient's BMI and encouraged positive lifestyle modifications to help get to or maintain a target BMI.  HM needs and immunizations were addressed and ordered. See below for orders. See HM and immunization section for updates.  Tdap today  Routine labs and screening tests ordered including cmp, cbc and lipids where appropriate.  Discussed recommendations regarding Vit D and calcium supplementation (see AVS)  Chronic disease management visit and/or acute problem visit:  Hypertension is well controlled.  Check renal and electrolytes.  Continue current management  Hyperlipidemia on statin and tolerating well.  Recheck levels today.  Check liver function.  Hypokalemia on supplements.  Recheck to ensure stability  Fibromyalgia and chronic pain for pain management.  Patient reports this is currently improved control  Depression, stable on Effexor.  No changes  Neurosurgery evaluation for follow-up after Arnold-Chiari malformation surgery and new neurologic changes including poor balance and right foot symptoms.  I will await their input.  Follow up: 6 months for follow-up  hypertension. Orders Placed This Encounter  Procedures  . Tdap vaccine greater than or equal to 7yo IM  . CBC with Differential/Platelet  . Comprehensive metabolic panel  . Lipid panel  . TSH   No orders of the defined types were placed in this encounter.     Lifestyle: Body  mass index is 29.08 kg/m. Wt Readings from Last 3 Encounters:  09/20/19 169 lb 6.4 oz (76.8 kg)  05/18/19 172 lb 8 oz (78.2 kg)  03/31/19 173 lb 9.6 oz (78.7 kg)    Patient Active Problem List   Diagnosis Date Noted  . Chronic prescription benzodiazepine use 10/23/2018    Priority: High  . Arnold-Chiari malformation (HCC) 05/18/2019  . Chronic migraine 05/18/2019  . Memory loss 05/18/2019    Evaluated by neurology, Dr. Terrace Arabia, Mini-Mental Status exam 29 of 30, name 15 animals and animal naming test.  Normal neurologic exam.  No cognitive changes noted.  Polypharmacy and depression as likely causes.   . Depression 05/18/2019  . Chiari malformation type I (HCC) 10/23/2018  . Chronic narcotic use 06/19/2018  . Hypokalemia 06/19/2018    ? GI loss; hasn't had work up. On supplement   . Migraine without aura and without status migrainosus, not intractable 06/19/2018  . Fibromyalgia 12/01/2012    Has failed gabapentin, lyrica, tramadol. Has seen Dr. Corliss Skains in the past.    . Major depression, recurrent, chronic (HCC) 05/21/2010  . Dyslipidemia 12/13/2008  . Collagenous colitis 01/01/2008    Dr. Leone Payor    . Chronic pain 12/02/2007    Dr. Vela Prose for chronic pain Head, neck, and back   . Essential hypertension 04/28/2007  . History of recurrent UTI (urinary tract infection) 04/28/2007  . Nephrolithiasis 04/28/2007   Health Maintenance  Topic Date Due  . MAMMOGRAM  05/25/2020  . Fecal DNA (Cologuard)  04/26/2022  . TETANUS/TDAP  09/19/2029  . INFLUENZA VACCINE  Completed  . Hepatitis C Screening  Completed  . HIV Screening  Completed  . PAP SMEAR-Modifier  Discontinued   Immunization History  Administered Date(s) Administered  . Influenza Split 05/27/2012  . Influenza Whole 03/15/2010  . Influenza, High Dose Seasonal PF 04/29/2017  . Influenza,inj,Quad PF,6+ Mos 06/02/2013, 05/24/2014, 05/09/2015, 05/28/2016, 06/19/2018, 03/31/2019  . Td 12/13/2008  . Tdap 09/20/2019    We updated and reviewed the patient's past history in detail and it is documented below. Allergies: Patient is allergic to codeine sulfate; moxifloxacin; penicillins; sulfonamide derivatives; biaxin [clarithromycin]; diphenhydramine hcl; and tramadol hcl. Past Medical History Patient  has a past medical history of Anxiety, Bladder stones, Bursitis of shoulder, Chiari malformation type I (HCC), Chronic prescription benzodiazepine use (10/23/2018), COLLAGENOUS COLITIS (01/01/2008), DEPRESSIVE DISORDER (05/21/2010), Fibromyalgia, History of kidney stones, HYPERLIPIDEMIA (12/13/2008), HYPERTENSION (04/28/2007), LOW BACK PAIN (12/02/2007), Migraine, Migraine without aura and without status migrainosus, not intractable (06/19/2018), NEPHROLITHIASIS, HX OF (04/28/2007), PONV (postoperative nausea and vomiting), Raynaud disease, Renal colic (02/12/2008), and UTI'S, RECURRENT (04/28/2007). Past Surgical History Patient  has a past surgical history that includes Abdominal hysterectomy (1990); colonoscopy; Wisdom tooth extraction; Suboccipital craniectomy cervical laminectomy (N/A, 12/11/2018); Ethmoidectomy (Right, 03/01/2019); Frontal sinus exploration (Right, 03/01/2019); and Sinus endo with fusion (Right, 03/01/2019). Family History: Patient family history includes Alcohol abuse in her father; Arrhythmia in her daughter; Healthy in her sister; Hyperlipidemia in her brother, brother, daughter, and mother; Hypertension in her brother, brother, and mother; Parkinson's disease in her mother; Polycystic ovary syndrome in her daughter; Suicidality in her father. Social History:  Patient  reports that she  quit smoking about 8 years ago. Her smoking use included cigarettes. She smoked 1.00 pack per day. She has never used smokeless tobacco. She reports that she does not drink alcohol or use drugs.  Review of Systems: Constitutional: negative for fever or malaise Ophthalmic: negative for photophobia, double vision or loss of  vision Cardiovascular: negative for chest pain, dyspnea on exertion, or new LE swelling Respiratory: negative for SOB or persistent cough Gastrointestinal: negative for abdominal pain, change in bowel habits or melena Genitourinary: negative for dysuria or gross hematuria, no abnormal uterine bleeding or disharge Musculoskeletal: Positive for new gait disturbance or muscular weakness Integumentary: negative for new or persistent rashes, no breast lumps Neurological: negative for TIA or stroke symptoms Psychiatric: negative for SI or delusions Allergic/Immunologic: negative for hives  Patient Care Team    Relationship Specialty Notifications Start End  Leamon Arnt, MD PCP - General Family Medicine  06/19/18   Gatha Mayer, MD Consulting Physician Gastroenterology  06/19/18   Orie Rout, MD Referring Physician Specialist  10/23/18    Comment: headache specialist  Leta Baptist, MD Consulting Physician Otolaryngology  10/23/18   Consuella Lose, MD Consulting Physician Neurosurgery  10/23/18   Dian Situ, MD  Pain Medicine  05/18/19     Objective  Vitals: BP 132/86 (BP Location: Right Arm, Patient Position: Sitting, Cuff Size: Normal)   Pulse 67   Temp 97.8 F (36.6 C) (Temporal)   Ht 5\' 4"  (1.626 m)   Wt 169 lb 6.4 oz (76.8 kg)   SpO2 98%   BMI 29.08 kg/m  General:  Well developed, well nourished, no acute distress  Psych:  Alert and orientedx3,normal mood and affect, appears calm and happier today HEENT:  Normocephalic, atraumatic, non-icteric sclera,  supple neck without adenopathy, mass or thyromegaly Cardiovascular:  Normal S1, S2, RRR without gallop, rub or murmur Respiratory:  Good breath sounds bilaterally, CTAB with normal respiratory effort Gastrointestinal: normal bowel sounds, soft, non-tender, no noted masses. No HSM MSK:  Joints are without erythema or swelling.  Skin:  Warm, no rashes or suspicious lesions noted on back Neurologic:    Mental status is  normal. + tremor Breast Exam: No mass, skin retraction or nipple discharge is appreciated in either breast. No axillary adenopathy. Fibrocystic changes are not noted     Commons side effects, risks, benefits, and alternatives for medications and treatment plan prescribed today were discussed, and the patient expressed understanding of the given instructions. Patient is instructed to call or message via MyChart if he/she has any questions or concerns regarding our treatment plan. No barriers to understanding were identified. We discussed Red Flag symptoms and signs in detail. Patient expressed understanding regarding what to do in case of urgent or emergency type symptoms.   Medication list was reconciled, printed and provided to the patient in AVS. Patient instructions and summary information was reviewed with the patient as documented in the AVS. This note was prepared with assistance of Dragon voice recognition software. Occasional wrong-word or sound-a-like substitutions may have occurred due to the inherent limitations of voice recognition software  This visit occurred during the SARS-CoV-2 public health emergency.  Safety protocols were in place, including screening questions prior to the visit, additional usage of staff PPE, and extensive cleaning of exam room while observing appropriate contact time as indicated for disinfecting solutions.

## 2019-09-21 MED ORDER — ATORVASTATIN CALCIUM 40 MG PO TABS: 40.0000 mg | ORAL_TABLET | Freq: Every day | ORAL | 3 refills | Status: DC

## 2019-09-21 NOTE — Addendum Note (Signed)
Addended by: Asencion Partridge on: 09/21/2019 07:58 AM   Modules accepted: Orders

## 2019-10-09 ENCOUNTER — Ambulatory Visit
Admission: RE | Admit: 2019-10-09 | Discharge: 2019-10-09 | Disposition: A | Discharge status: Home / Self Care | Payer: 59 | Source: Ambulatory Visit | Attending: Physician Assistant | Admitting: Physician Assistant

## 2019-10-09 ENCOUNTER — Other Ambulatory Visit: Payer: Self-pay

## 2019-10-09 DIAGNOSIS — R258 Other abnormal involuntary movements: Secondary | ICD-10-CM

## 2019-11-09 ENCOUNTER — Encounter: Payer: Self-pay | Admitting: Neurology

## 2019-12-28 ENCOUNTER — Other Ambulatory Visit: Payer: Self-pay | Admitting: Family Medicine

## 2019-12-28 DIAGNOSIS — E876 Hypokalemia: Secondary | ICD-10-CM

## 2019-12-31 ENCOUNTER — Other Ambulatory Visit: Payer: Self-pay | Admitting: Family Medicine

## 2020-02-04 ENCOUNTER — Encounter: Payer: Self-pay | Admitting: Neurology

## 2020-02-04 ENCOUNTER — Ambulatory Visit: Payer: 59 | Admitting: Neurology

## 2020-02-04 ENCOUNTER — Other Ambulatory Visit: Payer: Self-pay

## 2020-02-04 VITALS — BP 115/72 | HR 83 | Ht 64.0 in | Wt 170.8 lb

## 2020-02-04 DIAGNOSIS — Q0700 Arnold-Chiari syndrome without spina bifida or hydrocephalus: Secondary | ICD-10-CM

## 2020-02-04 DIAGNOSIS — R252 Cramp and spasm: Secondary | ICD-10-CM | POA: Diagnosis not present

## 2020-02-04 NOTE — Progress Notes (Signed)
[facility] Clinic Note - Initial Visit   Date: 02/04/20  [name] MRN: [id number] DOB: [birth year]   Dear Dr. Jordan Likes:  Thank you for your kind referral of [name] for consultation of right ankle myoclonus. Although her history is well known to you, please allow [place] to reiterate it for the purpose of our medical record. The patient was accompanied to the clinic by self.  History of Present Illness: [name] is a 64 y.o. female with Chiari I malformation s/p decompression, fibromyalgia, and chronic low back pain presenting for evaluation of myoclonus.   She underwent posterior decompression for Chiari I malformation in June 2020 and several weeks later, she began to experience involuntary tremor in the right leg.  It is present when she is sitting and laying down, lasting few minutes.  It is improved by walking. She also complains of tightness and pain over the foot.  No numbness.  She does not have similar symptoms on the left foot.  She is pleased with the results of her decompression.  Prior imaging has been extensive and included MRI brain, MRI cervical spine, MRI thoracic spine, and MRI lumbar spine.  Post-op MRI cervical spine shows decompressed posterior fossa with small pseudomeningocele, right foraminal stenosis at C5-6, small disc protrusion at T7-8 and T8-9, and L4-5.  She is followed by Dr. Jordan Likes for pain management.    Out-side paper records, electronic medical record, and images have been reviewed where available and summarized as:  MRI brain 08/09/18: Significant Chiari I malformation, tonsillar herniation with impaction well into the cervical canal, 11 mm of tonsillar descent greater on the RIGHT. No hydrocephalus or visible hydromyelia. Neurosurgical consultation is warranted.  Normal cerebral volume with only mild small vessel disease.  Significant RIGHT frontal and RIGHT anterior ethmoid sinus disease. ENT consultation is  warranted, as frontal sinusitis can progress to significant intracranial complications.  MRI lumbar spine 04/06/2019: Mild lower lumbar spondylosis. Findings are most pronounced at the L4-5 level where a left foraminal disc protrusion results in mild left foraminal stenosis and mild left subarticular recess stenosis without canal stenosis.  MRI cervical spine 10/10/2019:  1. Remote Chiari decompression. 2. Intermediate sized right foraminal C5-6 disc protrusion with severe right neural foraminal stenosis. 3. Mild left C5 foraminal narrowing  MRI thoracic spine 11/08/2019: Aside from T7-8 and T8-9, the other thoracic disc spaces are normal, with no spinal canal or neural foraminal stenosis.   Lab Results  Component Value Date   VITAMINB12 292 05/18/2019   Lab Results  Component Value Date   TSH 1.17 09/20/2019   No results found for: ESRSEDRATE, POCTSEDRATE  Past Medical History:  Diagnosis Date  . Anxiety   . Bladder stones   . Bursitis of shoulder   . Chiari malformation type I (HCC)   . Chronic prescription benzodiazepine use 10/23/2018  . 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 DEPRESSIVE DISORDER 05/21/2010  . Fibromyalgia   . History of kidney stones   . HYPERLIPIDEMIA 12/13/2008  . HYPERTENSION 04/28/2007  . LOW BACK PAIN 12/02/2007  . Migraine   . Migraine without aura and without status migrainosus, not intractable 06/19/2018  . NEPHROLITHIASIS, HX OF 04/28/2007  . PONV (postoperative nausea and vomiting)   . Raynaud disease   . Renal colic 02/12/2008  . UTI'S, RECURRENT 04/28/2007    Past Surgical History:  Procedure Laterality Date  . ABDOMINAL HYSTERECTOMY  1990   fiboids, prolapse  . colonoscopy    . ETHMOIDECTOMY  Right 03/01/2019   Procedure: TOTAL ETHMOIDECTOMY;  Surgeon: Newman Pies, MD;  Location: [facility];  Service: ENT;  Laterality: Right;  . FRONTAL SINUS EXPLORATION Right 03/01/2019   Procedure: FRONTAL RECESS EXPLORATION;  Surgeon: Newman Pies, MD;   Location: [facility];  Service: ENT;  Laterality: Right;  . MRI  2021   cervical and throacic spine, demonstrates good decompression at the cervicomedullary junction  . radiotherapy ablation N/A    back  . SINUS ENDO WITH FUSION Right 03/01/2019   Procedure: SINUS ENDOSCOPY WITH FUSION NAVIGATION;  Surgeon: Newman Pies, MD;  Location: [facility];  Service: ENT;  Laterality: Right;  . SUBOCCIPITAL CRANIECTOMY CERVICAL LAMINECTOMY N/A 12/11/2018   Procedure: Chiari Decompression;  Surgeon: Lisbeth Renshaw, MD;  Location: [facility];  Service: Neurosurgery;  Laterality: N/A;  posterior/ suboccipital  . WISDOM TOOTH EXTRACTION       Medications:  Outpatient Encounter Medications as of 02/04/2020  Medication Sig  . aspirin 81 MG chewable tablet Chew by mouth daily.  Marland Kitchen atorvastatin (LIPITOR) 40 MG tablet Take 1 tablet (40 mg total) by mouth daily.  . baclofen (LIORESAL) 10 MG tablet Take 10 mg by mouth 2 (two) times daily as needed (spasms/headaches.).   Marland Kitchen Cholecalciferol (VITAMIN D) 1000 UNITS capsule Take 1,000 Units by mouth daily.    Marland Kitchen eletriptan (RELPAX) 20 MG tablet Take 1 tablet (20 mg total) by mouth as needed for migraine or headache. May repeat in 2 hours if headache persists or recurs.  Marland Kitchen KLOR-CON M20 20 MEQ tablet TAKE 1 TABLET BY MOUTH TWICE A DAY  . loperamide (IMODIUM) 2 MG capsule Take 1 capsule (2 mg total) by mouth 4 (four) times daily as needed. (Patient taking differently: Take 2 mg by mouth 4 (four) times daily as needed (collagenous colitis). )  . NUCYNTA 50 MG tablet Take 50 mg by mouth every 6 (six) hours as needed.   . venlafaxine (EFFEXOR) 50 MG tablet TAKE 1 TABLET BY MOUTH  TWICE DAILY  . verapamil (CALAN-SR) 240 MG CR tablet TAKE 1 TABLET BY MOUTH AT  BEDTIME  . vitamin B-12 (CYANOCOBALAMIN) 250 MCG tablet Take 250 mcg by mouth daily.  . promethazine (PHENERGAN) 25 MG tablet Take 1 tablet (25 mg total) by mouth every 6 (six) hours as needed. for  nausea (Patient not taking: Reported on 02/04/2020)   No facility-administered encounter medications on file as of 02/04/2020.    Allergies:  Allergies  Allergen Reactions  . Codeine Sulfate Hives and Itching  . Moxifloxacin Hives and Itching  . Penicillins Hives and Itching    Tolerates cephalosporins Did it involve swelling of the face/tongue/throat, SOB, or low BP? No Did it involve sudden or severe rash/hives, skin peeling, or any reaction on the inside of your mouth or nose? No Did you need to seek medical attention at a hospital or doctor's office? No When did it last happen? If all above answers are "NO", may proceed with cephalosporin use.   . Sulfonamide Derivatives Hives and Rash  . Biaxin [Clarithromycin] Nausea Only    Metallic taste  . Diphenhydramine Hcl Other (See Comments)    side effect of being irritable  . Tramadol Hcl Nausea And Vomiting    "Doesn't agree with my system." Does not help pain and causes vomiting.    Family History: Family History  Problem Relation Age of Onset  . Hyperlipidemia Mother   . Hypertension Mother   . Parkinson's disease Mother   .  Alcohol abuse Father   . Suicidality Father   . Healthy Sister   . Hyperlipidemia Brother   . Arrhythmia Daughter   . Hyperlipidemia Daughter   . Polycystic ovary syndrome Daughter   . Hypertension Brother   . Hyperlipidemia Brother   . Hypertension Brother   . Diabetes Neg Hx   . Cancer Neg Hx     Social History: Social History   Tobacco Use  . Smoking status: Former Smoker    Packs/day: 1.00    Types: Cigarettes    Quit date: 2013    Years since quitting: 8.5  . Smokeless tobacco: Never Used  Vaping Use  . Vaping Use: Never assessed  Substance Use Topics  . Alcohol use: No  . Drug use: No   Social History   Social History Narrative   Lives at home with husband.   Right-handed.   No daily caffeine use.    Vital Signs:  BP 115/72   Pulse 83   Ht 5\' 4"  (1.626 m)   Wt  170 lb 12.8 oz (77.5 kg)   SpO2 97%   BMI 29.32 kg/m   Neurological Exam: MENTAL STATUS including orientation to time, place, person, recent and remote memory, attention span and concentration, language, and fund of knowledge is normal.  Speech is not dysarthric.  CRANIAL NERVES: II:  No visual field defects.     III-IV-VI: Pupils equal round and reactive to light.  Normal conjugate, extra-ocular eye movements in all directions of gaze.  No nystagmus.  No ptosis.   VII:  Normal facial symmetry and movements.   VIII:  Normal hearing and vestibular function.   IX-X:  Normal palatal movement.   XI:  Normal shoulder shrug.   XII:  Tongue is midline.  MOTOR:  No atrophy, fasciculations or abnormal movements.  No pronator drift. Reduced ROM with right ankle extension. Mild right foot spasticity.   Upper Extremity:  Right  Left  Deltoid  5/5   5/5   Biceps  5/5   5/5   Triceps  5/5   5/5   Infraspinatus 5/5  5/5  Medial pectoralis 5/5  5/5  Wrist extensors  5/5   5/5   Wrist flexors  5/5   5/5   Finger extensors  5/5   5/5   Finger flexors  5/5   5/5   Dorsal interossei  5/5   5/5   Abductor pollicis  5/5   5/5   Tone (Ashworth scale)  0  0   Lower Extremity:  Right  Left  Hip flexors  5/5   5/5   Hip extensors  5/5   5/5   Adductor 5/5  5/5  Abductor 5/5  5/5  Knee flexors  5/5   5/5   Knee extensors  5/5   5/5   Dorsiflexors  5/5   5/5   Plantarflexors  5/5   5/5   Toe extensors  5/5   5/5   Toe flexors  5/5   5/5   Tone (Ashworth scale)  0  0   MSRs:  Right        Left                  brachioradialis 2+  2+  biceps 2+  2+  triceps 2+  2+  patellar 3+  3+  ankle jerk 2+  2+  Hoffman no  no  plantar response down  down  Hoffmans sign is absent 1-2  beat ankle clonus on the right only.  SENSORY:  Normal and symmetric perception of pinprick and vibration.  Romberg's sign absent.   COORDINATION/GAIT: Normal finger-to- nose-finger and heel-to-shin.  Intact rapid  alternating movements bilaterally. Gait is mildly unsteady, unassisted.   IMPRESSION: 1. Right foot spasticity, mild, causing ankle clonus and tightness.  Discussed that this can be long term sequela from Chiari I malformation and management is symptomatic. Symptoms are not due to her decompression. Continue baclofen 10mg  BID which is prescribed by Dr. Start home right leg/foot stretching regimen.  PT, if no improvement  2. 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 This is not causing any of her current symptoms.    Return to clinic as needed  Thank you for allowing me to participate in patient's care.  If I can answer any additional questions, I would be pleased to do so.    Sincerely,    Colbie Danner K. Jordan Likes, DO

## 2020-02-04 NOTE — Patient Instructions (Signed)
Start stretching your right foot and leg Continue muscle relaxants as you are taking

## 2020-02-25 ENCOUNTER — Ambulatory Visit: Payer: 59 | Admitting: Neurology

## 2020-03-13 ENCOUNTER — Other Ambulatory Visit: Payer: Self-pay | Admitting: Family Medicine

## 2020-03-27 ENCOUNTER — Ambulatory Visit: Payer: 59 | Admitting: Family Medicine

## 2020-03-27 ENCOUNTER — Encounter: Payer: Self-pay | Admitting: Family Medicine

## 2020-03-27 ENCOUNTER — Other Ambulatory Visit: Payer: Self-pay

## 2020-03-27 VITALS — BP 108/66 | HR 70 | Temp 98.0°F | Resp 16 | Ht 64.0 in | Wt 168.0 lb

## 2020-03-27 DIAGNOSIS — M797 Fibromyalgia: Secondary | ICD-10-CM | POA: Diagnosis not present

## 2020-03-27 DIAGNOSIS — E538 Deficiency of other specified B group vitamins: Secondary | ICD-10-CM

## 2020-03-27 DIAGNOSIS — F339 Major depressive disorder, recurrent, unspecified: Secondary | ICD-10-CM

## 2020-03-27 DIAGNOSIS — G894 Chronic pain syndrome: Secondary | ICD-10-CM

## 2020-03-27 DIAGNOSIS — E876 Hypokalemia: Secondary | ICD-10-CM

## 2020-03-27 DIAGNOSIS — I10 Essential (primary) hypertension: Secondary | ICD-10-CM

## 2020-03-27 DIAGNOSIS — K52831 Collagenous colitis: Secondary | ICD-10-CM

## 2020-03-27 MED ORDER — LOPERAMIDE HCL 2 MG PO CAPS: 2.0000 mg | ORAL_CAPSULE | Freq: Four times a day (QID) | ORAL | Status: AC | PRN

## 2020-03-27 NOTE — Patient Instructions (Signed)
Please return in 6 months for your annual complete physical; please come fasting.  I will release your lab results to you on your MyChart account with further instructions. Please reply with any questions.   Today you were given your flu and 1st of 2 shingrix vaccinations.   If you have any questions or concerns, please don't hesitate to send me a message via MyChart or call the office at [phone]. Thank you for visiting with [place] today! It's our pleasure caring for you.   Vitamin B12 Deficiency Vitamin B12 deficiency occurs when the body does not have enough vitamin B12, which is an important vitamin. The body needs this vitamin:  To make red blood cells.  To make DNA. This is the genetic material inside cells.  To help the nerves work properly so they can carry messages from the brain to the body. Vitamin B12 deficiency can cause various health problems, such as a low red blood cell count (anemia) or nerve damage. What are the causes? This condition may be caused by:  Not eating enough foods that contain vitamin B12.  Not having enough stomach acid and digestive fluids to properly absorb vitamin B12 from the food that you eat.  Certain digestive system diseases that make it hard to absorb vitamin B12. These diseases include Crohn's disease, chronic pancreatitis, and cystic fibrosis.  A condition in which the body does not make enough of a protein (intrinsic factor), resulting in too few red blood cells (pernicious anemia).  Having a surgery in which part of the stomach or small intestine is removed.  Taking certain medicines that make it hard for the body to absorb vitamin B12. These medicines include: ? Heartburn medicines (antacids and proton pump inhibitors). ? Certain antibiotic medicines. ? Some medicines that are used to treat diabetes, tuberculosis, gout, or high cholesterol. What increases the risk? The following factors may make you more likely to develop a B12  deficiency:  Being older than age 72.  Eating a vegetarian or vegan diet, especially while you are pregnant.  Eating a poor diet while you are pregnant.  Taking certain medicines.  Having alcoholism. What are the signs or symptoms? In some cases, there are no symptoms of this condition. If the condition leads to anemia or nerve damage, various symptoms can occur, such as:  Weakness.  Fatigue.  Loss of appetite.  Weight loss.  Numbness or tingling in your hands and feet.  Redness and burning of the tongue.  Confusion or memory problems.  Depression.  Sensory problems, such as color blindness, ringing in the ears, or loss of taste.  Diarrhea or constipation.  Trouble walking. If anemia is severe, symptoms can include:  Shortness of breath.  Dizziness.  Rapid heart rate (tachycardia). How is this diagnosed? This condition may be diagnosed with a blood test to measure the level of vitamin B12 in your blood. You may also have other tests, including:  A group of tests that measure certain characteristics of blood cells (complete blood count, CBC).  A blood test to measure intrinsic factor.  A procedure where a thin tube with a camera on the end is used to look into your stomach or intestines (endoscopy). Other tests may be needed to discover the cause of B12 deficiency. How is this treated? Treatment for this condition depends on the cause. This condition may be treated by:  Changing your eating and drinking habits, such as: ? Eating more foods that contain vitamin B12. ? Drinking less alcohol  or no alcohol.  Getting vitamin B12 injections.  Taking vitamin B12 supplements. Your health care provider will tell you which dosage is best for you. Follow these instructions at home: Eating and drinking   Eat lots of healthy foods that contain vitamin B12, including: ? Meats and poultry. This includes beef, pork, chicken, [place], and organ meats, such as  liver. ? Seafood. This includes clams, rainbow trout, salmon, tuna, and haddock. ? Eggs. ? Cereal and dairy products that are fortified. This means that vitamin B12 has been added to the food. Check the label on the package to see if the food is fortified. The items listed above may not be a complete list of recommended foods and beverages. Contact a dietitian for more information. General instructions  Get any injections that are prescribed by your health care provider.  Take supplements only as told by your health care provider. Follow the directions carefully.  Do not drink alcohol if your health care provider tells you not to. In some cases, you may only be asked to limit alcohol use.  Keep all follow-up visits as told by your health care provider. This is important. Contact a health care provider if:  Your symptoms come back. Get help right away if you:  Develop shortness of breath.  Have a rapid heart rate.  Have chest pain.  Become dizzy or lose consciousness. Summary  Vitamin B12 deficiency occurs when the body does not have enough vitamin B12.  The main causes of vitamin B12 deficiency include dietary deficiency, digestive diseases, pernicious anemia, and having a surgery in which part of the stomach or small intestine is removed.  In some cases, there are no symptoms of this condition. If the condition leads to anemia or nerve damage, various symptoms can occur, such as weakness, shortness of breath, and numbness.  Treatment may include getting vitamin B12 injections or taking vitamin B12 supplements. Eat lots of healthy foods that contain vitamin B12. This information is not intended to replace advice given to you by your health care provider. Make sure you discuss any questions you have with your health care provider. Document Revised: 12/11/2018 Document Reviewed: 03/03/2018 Elsevier Patient Education  2020 [employer].

## 2020-03-27 NOTE — Progress Notes (Signed)
Subjective  CC:  Chief Complaint  Patient presents with  . Hypertension    HPI: [name] is a 64 y.o. female who presents to the office today to address the problems listed above in the chief complaint.  Hypertension f/u: Control is good . Pt reports she is doing well. taking medications as instructed, no medication side effects noted, no TIAs, no chest pain on exertion, no dyspnea on exertion, no swelling of ankles. She denies adverse effects from his BP medications. Compliance with medication is good.   Fibromyalgia and chronic pain: Continues with pain management.  Reports things are stable.  She is managing.  Spasticity in right foot: I reviewed recent neurology notes.  Due to complication of Budd-Chiari malformation.  Baclofen recommended.  No further work-up or follow-up needed.  Major depression on Effexor remained stable.  Collagenous colitis: Having an active flare.  Imodium helps.  No fevers or significant abdominal pain.  Vitamin B12 deficiency: Taking over-the-counter 250 mcg daily.  Due for recheck.  Immunizations: Flu and Shingrix today  Assessment  1. Essential hypertension   2. Fibromyalgia   3. Hypokalemia   4. Major depression, recurrent, chronic (HCC)   5. Chronic pain syndrome   6. Collagenous colitis   7. Vitamin B12 deficiency      Plan    Hypertension f/u: BP control is well controlled.  Continue current medications.  History of hypokalemia with collagenous colitis flare: Recheck potassium levels today  Depression f/u: Stable  Fibromyalgia and chronic pain: Review chronic pain management notes.  Stable  Vitamin B12 deficiency: Recheck today.  Immunizations updated today   Education regarding management of these chronic disease states was given. Management strategies discussed on successive visits include dietary and exercise recommendations, goals of achieving and maintaining IBW, and lifestyle modifications aiming for adequate sleep  and minimizing stressors.   Follow up: No follow-ups on file.  Orders Placed This Encounter  Procedures  . Vitamin B12  . Basic metabolic panel   Meds ordered this encounter  Medications  . loperamide (IMODIUM) 2 MG capsule    Sig: Take 1 capsule (2 mg total) by mouth 4 (four) times daily as needed (collagenous colitis).      BP Readings from Last 3 Encounters:  03/27/20 108/66  02/04/20 115/72  09/20/19 132/86   Wt Readings from Last 3 Encounters:  03/27/20 168 lb (76.2 kg)  02/04/20 170 lb 12.8 oz (77.5 kg)  09/20/19 169 lb 6.4 oz (76.8 kg)    Lab Results  Component Value Date   CHOL 189 09/20/2019   CHOL 195 07/22/2018   CHOL 186 12/04/2016   Lab Results  Component Value Date   HDL 83.90 09/20/2019   HDL 66.40 07/22/2018   HDL 58.70 12/04/2016   Lab Results  Component Value Date   LDLCALC 90 09/20/2019   LDLCALC 95 07/22/2018   LDLCALC 102 (H) 12/04/2016   Lab Results  Component Value Date   TRIG 72.0 09/20/2019   TRIG 166.0 (H) 07/22/2018   TRIG 126.0 12/04/2016   Lab Results  Component Value Date   CHOLHDL 2 09/20/2019   CHOLHDL 3 07/22/2018   CHOLHDL 3 12/04/2016   Lab Results  Component Value Date   LDLDIRECT 98.0 11/21/2015   LDLDIRECT 158.7 11/22/2011   LDLDIRECT 214.9 12/06/2008   Lab Results  Component Value Date   CREATININE 1.07 09/20/2019   BUN 21 09/20/2019   NA 138 09/20/2019   K 4.8 09/20/2019   CL 102  09/20/2019   CO2 28 09/20/2019    The 10-year ASCVD risk score Denman George DC Jr., et al., 2013) is: 2.9%   Values used to calculate the score:     Age: 49 years     Sex: Female     Is Non-Hispanic African American: No     Diabetic: No     Tobacco smoker: No     Systolic Blood Pressure: 108 mmHg     Is BP treated: Yes     HDL Cholesterol: 83.9 mg/dL     Total Cholesterol: 189 mg/dL  I reviewed the patients updated PMH, FH, and SocHx.    Patient Active Problem List   Diagnosis Date Noted  . Chronic prescription  benzodiazepine use 10/23/2018    Priority: High  . Vitamin B12 deficiency 03/27/2020  . Arnold-Chiari malformation (HCC) 05/18/2019  . Chronic migraine 05/18/2019  . Memory loss 05/18/2019  . Depression 05/18/2019  . Chiari malformation type I (HCC) 10/23/2018  . Chronic narcotic use 06/19/2018  . Hypokalemia 06/19/2018  . Migraine without aura and without status migrainosus, not intractable 06/19/2018  . Fibromyalgia 12/01/2012  . Major depression, recurrent, chronic (HCC) 05/21/2010  . Dyslipidemia 12/13/2008  . Collagenous colitis 01/01/2008  . Chronic pain 12/02/2007  . Essential hypertension 04/28/2007  . History of recurrent UTI (urinary tract infection) 04/28/2007  . Nephrolithiasis 04/28/2007    Allergies: Codeine sulfate, Moxifloxacin, Penicillins, Sulfonamide derivatives, Biaxin [clarithromycin], Diphenhydramine hcl, and Tramadol hcl  Social History: Patient  reports that she quit smoking about 8 years ago. Her smoking use included cigarettes. She smoked 1.00 pack per day. She has never used smokeless tobacco. She reports that she does not drink alcohol and does not use drugs.  Current Meds  Medication Sig  . aspirin 81 MG chewable tablet Chew by mouth daily.  Marland Kitchen atorvastatin (LIPITOR) 40 MG tablet TAKE 1 TABLET BY MOUTH  DAILY  . baclofen (LIORESAL) 10 MG tablet Take 10 mg by mouth 3 (three) times daily.   . Cholecalciferol (VITAMIN D) 1000 UNITS capsule Take 1,000 Units by mouth daily.    Marland Kitchen eletriptan (RELPAX) 20 MG tablet Take 1 tablet (20 mg total) by mouth as needed for migraine or headache. May repeat in 2 hours if headache persists or recurs.  Marland Kitchen KLOR-CON M20 20 MEQ tablet TAKE 1 TABLET BY MOUTH TWICE A DAY  . loperamide (IMODIUM) 2 MG capsule Take 1 capsule (2 mg total) by mouth 4 (four) times daily as needed (collagenous colitis).  . NUCYNTA 50 MG tablet Take 75 mg by mouth every 6 (six) hours as needed.   . promethazine (PHENERGAN) 25 MG tablet Take 1 tablet (25  mg total) by mouth every 6 (six) hours as needed. for nausea  . venlafaxine (EFFEXOR) 50 MG tablet TAKE 1 TABLET BY MOUTH  TWICE DAILY  . verapamil (CALAN-SR) 240 MG CR tablet TAKE 1 TABLET BY MOUTH AT  BEDTIME  . vitamin B-12 (CYANOCOBALAMIN) 250 MCG tablet Take 250 mcg by mouth daily.  . [DISCONTINUED] loperamide (IMODIUM) 2 MG capsule Take 1 capsule (2 mg total) by mouth 4 (four) times daily as needed. (Patient taking differently: Take 2 mg by mouth 4 (four) times daily as needed (collagenous colitis). )    Review of Systems: Cardiovascular: negative for chest pain, palpitations, leg swelling, orthopnea Respiratory: negative for SOB, wheezing or persistent cough Gastrointestinal: negative for abdominal pain Genitourinary: negative for dysuria or gross hematuria  Objective  Vitals: BP 108/66   Pulse 70  Temp 98 F (36.7 C) (Temporal)   Resp 16   Ht 5\' 4"  (1.626 m)   Wt 168 lb (76.2 kg)   SpO2 97%   BMI 28.84 kg/m  General: no acute distress  Cardiovascular:  RRR without murmur. no edema Respiratory:  Good breath sounds bilaterally, CTAB with normal respiratory effort Neurologic:   Mental status is normal  Commons side effects, risks, benefits, and alternatives for medications and treatment plan prescribed today were discussed, and the patient expressed understanding of the given instructions. Patient is instructed to call or message via MyChart if he/she has any questions or concerns regarding our treatment plan. No barriers to understanding were identified. We discussed Red Flag symptoms and signs in detail. Patient expressed understanding regarding what to do in case of urgent or emergency type symptoms.   Medication list was reconciled, printed and provided to the patient in AVS. Patient instructions and summary information was reviewed with the patient as documented in the AVS. This note was prepared with assistance of Dragon voice recognition software. Occasional wrong-word or  sound-a-like substitutions may have occurred due to the inherent limitations of voice recognition software  This visit occurred during the SARS-CoV-2 public health emergency.  Safety protocols were in place, including screening questions prior to the visit, additional usage of staff PPE, and extensive cleaning of exam room while observing appropriate contact time as indicated for disinfecting solutions.

## 2020-03-28 ENCOUNTER — Other Ambulatory Visit: Payer: Self-pay | Admitting: Family Medicine

## 2020-03-28 DIAGNOSIS — E876 Hypokalemia: Secondary | ICD-10-CM

## 2020-03-28 LAB — VITAMIN B12: Vitamin B-12: >2000 pg/mL — ABNORMAL HIGH (ref 200–1100)

## 2020-03-28 LAB — BASIC METABOLIC PANEL
BUN/Creatinine Ratio: 13 (calc) (ref 6–22)
BUN: 16 mg/dL (ref 7–25)
CO2: 29 mmol/L (ref 20–32)
Calcium: 9.2 mg/dL (ref 8.6–10.4)
Chloride: 107 mmol/L (ref 98–110)
Creat: 1.28 mg/dL — ABNORMAL HIGH (ref 0.50–0.99)
Glucose, Bld: 92 mg/dL (ref 65–99)
Potassium: 4.2 mmol/L (ref 3.5–5.3)
Sodium: 142 mmol/L (ref 135–146)

## 2020-03-28 MED ORDER — VITAMIN B-12 250 MCG PO TABS: 250.0000 ug | ORAL_TABLET | ORAL | Status: DC

## 2020-09-20 IMAGING — MR MR THORACIC SPINE W/O CM
4 of 6 series · 12 of 48 positions shown · non-contrast
Comparison: None.
COMPARISON: None.

Addendum:
CLINICAL DATA: Mid back pain

EXAM:
MRI THORACIC SPINE WITHOUT CONTRAST
TECHNIQUE: Multiplanar, multisequence MR imaging of the thoracic spine was
performed. No intravenous contrast was administered.

[Series 6: T2 · sagittal · 4.0mm · 0.38mm/px · 3 of 12 slices shown (1 of 3)]
[im 1/12]
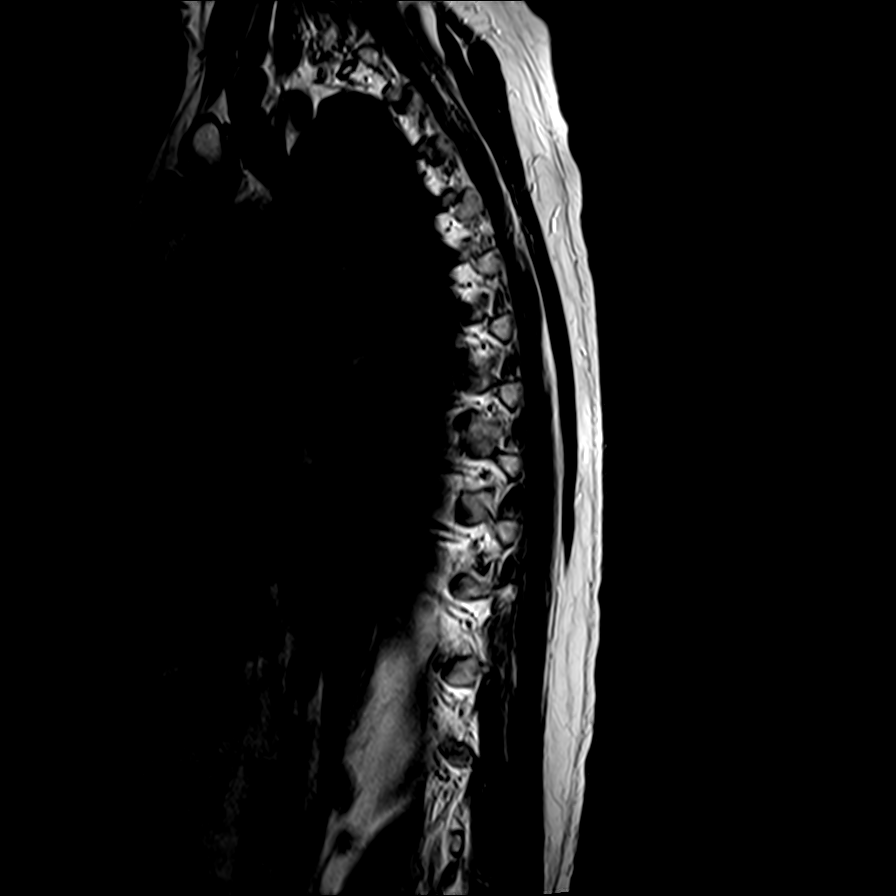
[im 6/12]
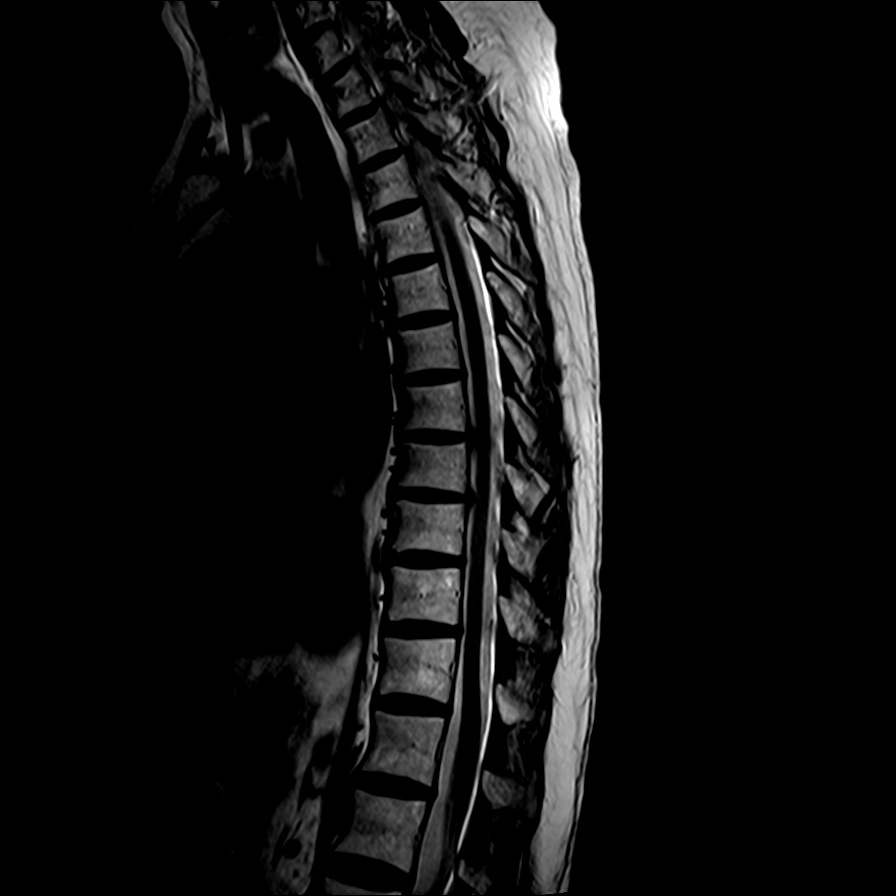
[im 12/12]
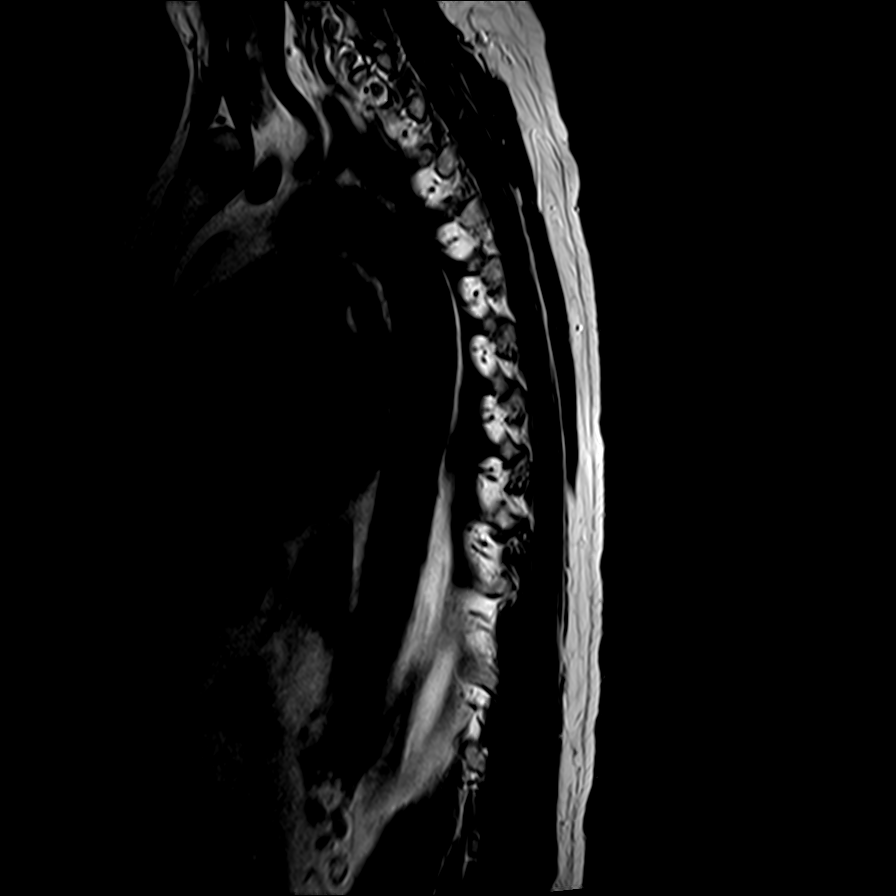

[Series 8: T1 · sagittal · 4.0mm · 0.44mm/px · 3 of 12 slices shown]
[im 1/12]
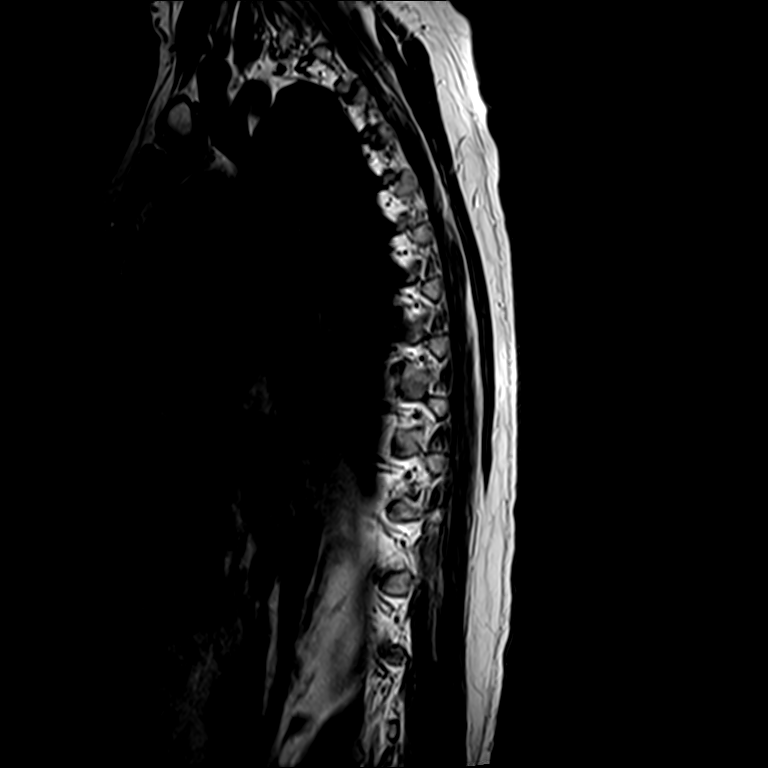
[im 6/12]
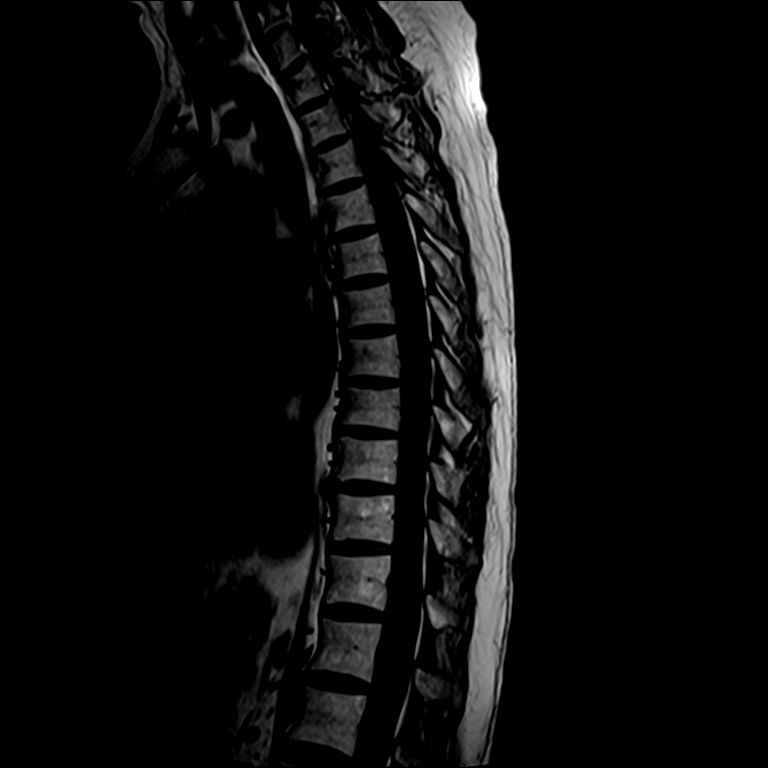
[im 12/12]
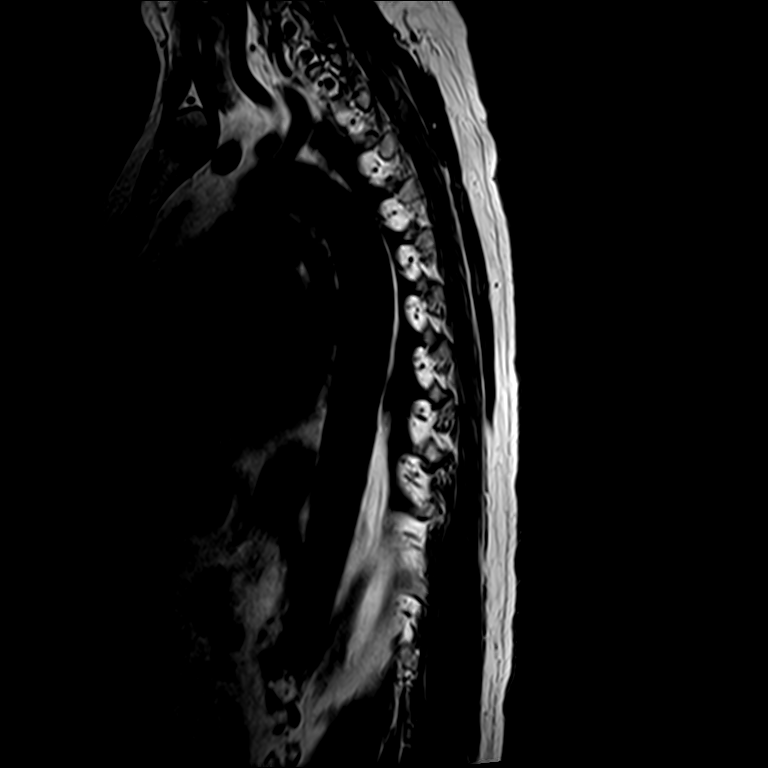

[Series 9: T2 · axial · 4.0mm · 0.39mm/px · z∈[-262,-103]mm · 3 of 36 slices shown (2 of 3)]
[im 6/36]
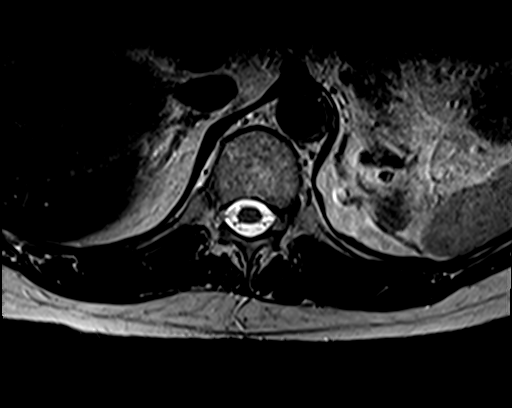
[im 18/36]
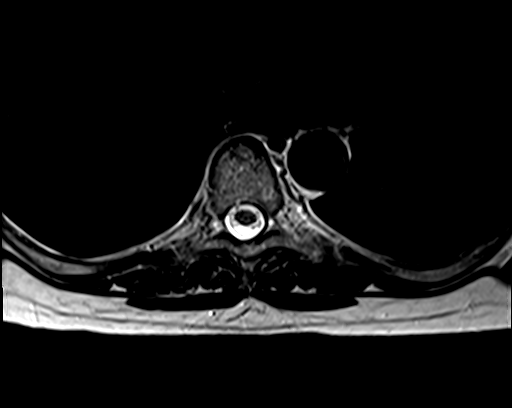
[im 31/36]
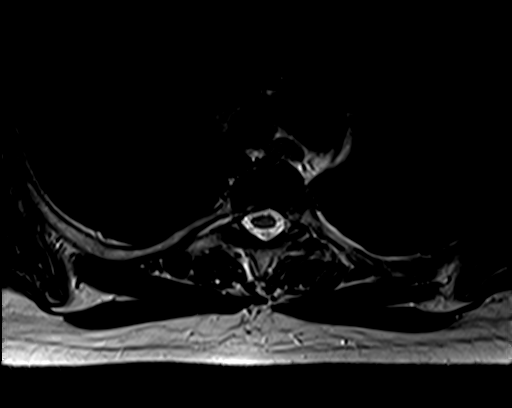

[Series 10: T2 · axial · 4.0mm · 0.78mm/px · z∈[-262,-103]mm · 3 of 36 slices shown (3 of 3)]
[im 6/36]
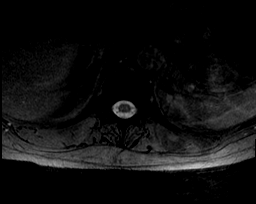
[im 18/36]
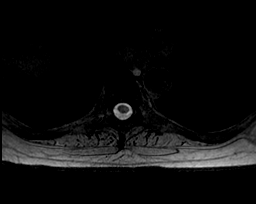
[im 31/36]
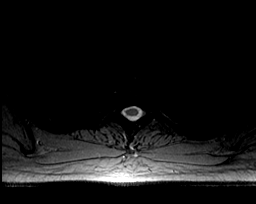

[12 of 48 positions shown; findings below may reference images not displayed]

FINDINGS: Alignment:  Physiologic.

Vertebrae: No fracture, evidence of discitis, or bone lesion.

Cord:  Normal signal and morphology.

Paraspinal and other soft tissues: Negative.

Disc levels:

T7-8: Small left subarticular disc protrusion. No stenosis.

T8-9: Small left subarticular disc protrusion. No stenosis.
IMPRESSION: No spinal canal or neuroforaminal stenosis.

ADDENDUM:
Aside from T7-8 and T8-9, the other thoracic disc spaces are normal,
with no spinal canal or neural foraminal stenosis.

*** End of Addendum ***
FINDINGS: Alignment:  Physiologic.

Vertebrae: No fracture, evidence of discitis, or bone lesion.

Cord:  Normal signal and morphology.

Paraspinal and other soft tissues: Negative.

Disc levels:

T7-8: Small left subarticular disc protrusion. No stenosis.

T8-9: Small left subarticular disc protrusion. No stenosis.
IMPRESSION: No spinal canal or neuroforaminal stenosis.

## 2020-09-25 ENCOUNTER — Encounter: Payer: 59 | Admitting: Family Medicine

## 2020-10-10 ENCOUNTER — Encounter: Payer: Self-pay | Admitting: Family Medicine

## 2020-10-11 ENCOUNTER — Other Ambulatory Visit: Payer: Self-pay

## 2020-10-11 ENCOUNTER — Ambulatory Visit (INDEPENDENT_AMBULATORY_CARE_PROVIDER_SITE_OTHER): Payer: 59 | Admitting: Family Medicine

## 2020-10-11 ENCOUNTER — Encounter: Payer: Self-pay | Admitting: Family Medicine

## 2020-10-11 VITALS — BP 122/78 | HR 71 | Temp 98.3°F | Wt 167.2 lb

## 2020-10-11 DIAGNOSIS — Z0000 Encounter for general adult medical examination without abnormal findings: Secondary | ICD-10-CM | POA: Diagnosis not present

## 2020-10-11 DIAGNOSIS — Z23 Encounter for immunization: Secondary | ICD-10-CM | POA: Diagnosis not present

## 2020-10-11 DIAGNOSIS — I10 Essential (primary) hypertension: Secondary | ICD-10-CM | POA: Diagnosis not present

## 2020-10-11 DIAGNOSIS — E785 Hyperlipidemia, unspecified: Secondary | ICD-10-CM

## 2020-10-11 DIAGNOSIS — E538 Deficiency of other specified B group vitamins: Secondary | ICD-10-CM | POA: Diagnosis not present

## 2020-10-11 DIAGNOSIS — Q0700 Arnold-Chiari syndrome without spina bifida or hydrocephalus: Secondary | ICD-10-CM

## 2020-10-11 DIAGNOSIS — E876 Hypokalemia: Secondary | ICD-10-CM | POA: Diagnosis not present

## 2020-10-11 DIAGNOSIS — F339 Major depressive disorder, recurrent, unspecified: Secondary | ICD-10-CM

## 2020-10-11 DIAGNOSIS — K52831 Collagenous colitis: Secondary | ICD-10-CM

## 2020-10-11 DIAGNOSIS — G894 Chronic pain syndrome: Secondary | ICD-10-CM | POA: Diagnosis not present

## 2020-10-11 DIAGNOSIS — M797 Fibromyalgia: Secondary | ICD-10-CM | POA: Diagnosis not present

## 2020-10-11 DIAGNOSIS — Z1231 Encounter for screening mammogram for malignant neoplasm of breast: Secondary | ICD-10-CM

## 2020-10-11 DIAGNOSIS — F1190 Opioid use, unspecified, uncomplicated: Secondary | ICD-10-CM

## 2020-10-11 LAB — COMPREHENSIVE METABOLIC PANEL
ALT: 26 U/L (ref 0–35)
AST: 14 U/L (ref 0–37)
Albumin: 3.9 g/dL (ref 3.5–5.2)
Alkaline Phosphatase: 77 U/L (ref 39–117)
BUN: 20 mg/dL (ref 6–23)
CO2: 29 mEq/L (ref 19–32)
Calcium: 9 mg/dL (ref 8.4–10.5)
Chloride: 105 mEq/L (ref 96–112)
Creatinine, Ser: 1.2 mg/dL (ref 0.40–1.20)
GFR: 48.39 mL/min — ABNORMAL LOW (ref >60.00)
Glucose, Bld: 70 mg/dL (ref 70–99)
Potassium: 4.4 mEq/L (ref 3.5–5.1)
Sodium: 141 mEq/L (ref 135–145)
Total Bilirubin: 0.6 mg/dL (ref 0.2–1.2)
Total Protein: 6.4 g/dL (ref 6.0–8.3)

## 2020-10-11 LAB — CBC WITH DIFFERENTIAL/PLATELET
Basophils Absolute: 0.1 10*3/uL (ref 0.0–0.1)
Basophils Relative: 0.8 % (ref 0.0–3.0)
Eosinophils Absolute: 0.1 10*3/uL (ref 0.0–0.7)
Eosinophils Relative: 0.6 % (ref 0.0–5.0)
HCT: 39.7 % (ref 36.0–46.0)
Hemoglobin: 13 g/dL (ref 12.0–15.0)
Lymphocytes Relative: 29.2 % (ref 12.0–46.0)
Lymphs Abs: 2.6 10*3/uL (ref 0.7–4.0)
MCHC: 32.7 g/dL (ref 30.0–36.0)
MCV: 90.6 fl (ref 78.0–100.0)
Monocytes Absolute: 0.6 10*3/uL (ref 0.1–1.0)
Monocytes Relative: 7.1 % (ref 3.0–12.0)
Neutro Abs: 5.5 10*3/uL (ref 1.4–7.7)
Neutrophils Relative %: 62.3 % (ref 43.0–77.0)
Platelets: 280 10*3/uL (ref 150.0–400.0)
RBC: 4.38 Mil/uL (ref 3.87–5.11)
RDW: 14 % (ref 11.5–15.5)
WBC: 8.8 10*3/uL (ref 4.0–10.5)

## 2020-10-11 LAB — LIPID PANEL
Cholesterol: 180 mg/dL (ref 0–200)
HDL: 82.6 mg/dL (ref >39.00)
LDL Cholesterol: 83 mg/dL (ref 0–99)
NonHDL: 97.06
Total CHOL/HDL Ratio: 2
Triglycerides: 71 mg/dL (ref 0.0–149.0)
VLDL: 14.2 mg/dL (ref 0.0–40.0)

## 2020-10-11 LAB — TSH: TSH: 2.08 u[IU]/mL (ref 0.35–4.50)

## 2020-10-11 LAB — MAGNESIUM: Magnesium: 2 mg/dL (ref 1.5–2.5)

## 2020-10-11 LAB — VITAMIN B12: Vitamin B-12: 293 pg/mL (ref 211–911)

## 2020-10-11 MED ORDER — BUPROPION HCL ER (XL) 150 MG PO TB24: 150.0000 mg | ORAL_TABLET | Freq: Every day | ORAL | 1 refills | Status: DC

## 2020-10-11 NOTE — Progress Notes (Signed)
Subjective  Chief Complaint  Patient presents with  . Annual Exam    Fasting, issues with memory loss - finds herself having to reread sentences over and over and loses train of thought. Has noticed increase in irritability   . Tinnitus    Bilateral, worsening over the past month   . Health Maintenance    2nd shingrix given in office today, will need mammogram ordered today     HPI: [name] is a 65 y.o. female who presents to [facility] today for a Female Wellness Visit. She also has the concerns and/or needs as listed above in the chief complaint. These will be addressed in addition to the Health Maintenance Visit.   Wellness Visit: annual visit with health maintenance review and exam without Pap   Health maintenance: Overdue for mammogram.  Colorectal cancer screening up-to-date.  Due second Shingrix vaccination.  Other immunizations up-to-date.  Eye exam up-to-date and normal.  Continues to work from home, full-time.  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  Lives with her husband.  1 daughter is currently pregnant and lives in [place]. Chronic disease f/u and/or acute problem visit: (deemed necessary to be done in addition to the wellness visit):  Mood: Chronic depression that is currently active.  PHQ-9 score is 18 today.  She has been on Effexor 50 mg twice a day for many years.  Up until about 2 months ago she had been very stable.  She is not exactly sure what the trigger was.  Has noted decreased mood and increased irritability.  Low motivation.  Sleeping more.  She admits that she has had some life stressors including her mother-in-law's passing, feeling badly that her daughter lives so far away in a stressful job.  She denies panic attacks or suicidal ideation.  She would be open to counseling.  Discussed memory issues.  Discussed how this is hard to evaluate in the setting of active depression. Depression screen [facility] 2/9 10/11/2020  09/20/2019 10/23/2018 07/22/2018 06/19/2018  Decreased Interest 3 0 0 1 0  Down, Depressed, Hopeless 1 0 1 0 0  PHQ - 2 Score 4 0 1 1 0  Altered sleeping 3 3 0 0 0  Tired, decreased energy 3 1 2 2  0  Change in appetite 3 1 0 3 0  Feeling bad or failure about yourself  0 0 1 2 0  Trouble concentrating 3 0 0 0 0  Moving slowly or fidgety/restless 1 0 0 0 0  Suicidal thoughts 0 0 0 0 0  PHQ-9 Score 17 5 4 8  0  Difficult doing work/chores Very difficult Somewhat difficult Not difficult at all Not difficult at all Not difficult at all    Chronic medical problem follow-up including hypertension, dyslipidemia, chronic pain due to fibromyalgia and 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  Blood pressure has been good.  No chest pain or shortness of breath.  Tolerating statin, atorvastatin 20 mg nightly.  Pain is manageable.  Takes potassium supplements for chronic GI losses.  No active colitis flares.  No longer on B12 supplements.  Fasting for recheck today.     Assessment  1. Annual physical exam   2. Essential hypertension   3. Dyslipidemia   4. Fibromyalgia   5. Chronic pain syndrome   6. Major depression, recurrent, chronic (HCC)  7. Chronic narcotic use   8. Arnold-Chiari malformation (HCC)   9. Collagenous colitis   10. Hypokalemia   11. Vitamin B12 deficiency      Plan  Female Wellness Visit:  Age appropriate Health Maintenance and Prevention measures were discussed with patient. Included topics are cancer screening recommendations, ways to keep healthy (see AVS) including dietary and exercise recommendations, regular eye and dental care, use of seat belts, and avoidance of moderate alcohol use and tobacco use.  Mammogram ordered, patient to schedule  BMI: discussed patient's BMI and encouraged positive lifestyle modifications to help get to or maintain a  target BMI.  HM needs and immunizations were addressed and ordered. See below for orders. See HM and immunization section for updates.  Second Shingrix vaccination updated today  Routine labs and screening tests ordered including cmp, cbc and lipids where appropriate.  Discussed recommendations regarding Vit D and calcium supplementation (see AVS)  Chronic disease management visit and/or acute problem visit:  Major chronic recurrent depression, moderate episode: Counseling done.  We will add Wellbutrin 150 XL daily.  Continue Effexor 50 mg twice daily.  Recommend counseling.  Patient to schedule.  Close follow-up.  Recheck 3 months if improving.  Sooner if needed.  Hypertension is well controlled on calcium channel blocker.  Check renal function electrolytes.  Hyperlipidemia on atorvastatin for recheck.  Chronic pain per pain management.  No new neurologic symptoms.  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  Recheck B12 levels off supplements.  Colitis is stable.   Follow up: No follow-ups on file.  Orders Placed This Encounter  Procedures  . CBC with Differential/Platelet  . Comprehensive metabolic panel  . Lipid panel  . TSH  . Vitamin B12  . Magnesium   No orders of the defined types were placed in this encounter.     Body mass index is 28.7 kg/m. Wt Readings from Last 3 Encounters:  10/11/20 167 lb 3.2 oz (75.8 kg)  03/27/20 168 lb (76.2 kg)  02/04/20 170 lb 12.8 oz (77.5 kg)     Patient Active Problem List   Diagnosis Date Noted  . Arnold-Chiari malformation (HCC) 05/18/2019    Priority: High  . Chronic prescription benzodiazepine use 10/23/2018    Priority: High  . Chronic narcotic use 06/19/2018    Priority: High  . Fibromyalgia 12/01/2012    Priority: High    Has failed gabapentin, lyrica, tramadol. Has seen Dr. Corliss Skains in the past.    . Major depression, recurrent, chronic (HCC) 05/21/2010    Priority: High  . Dyslipidemia  12/13/2008    Priority: High  . Chronic pain 12/02/2007    Priority: High    Dr. Vela Prose for chronic pain Head, neck, and back   . Essential hypertension 04/28/2007    Priority: High  . Chronic migraine 05/18/2019    Priority: Medium  . Memory loss 05/18/2019    Priority: Medium    Evaluated by neurology, Dr. Terrace Arabia, Mini-Mental Status exam 29 of 30, name 15 animals and animal naming test.  Normal neurologic exam.  No cognitive changes noted.  Polypharmacy and depression as likely causes.   . Hypokalemia 06/19/2018    Priority: Medium    ? GI loss; hasn't had work up. On supplement   . Collagenous colitis 01/01/2008    Priority: Medium    Dr. Leone Payor    . History of recurrent UTI (urinary tract infection) 04/28/2007    Priority: Medium  . Nephrolithiasis 04/28/2007  Priority: Medium  . Vitamin B12 deficiency 03/27/2020    Priority: Low  . Migraine without aura and without status migrainosus, not intractable 06/19/2018   Health Maintenance  Topic Date Due  . MAMMOGRAM  05/25/2020  . INFLUENZA VACCINE  02/05/2021  . Fecal DNA (Cologuard)  04/26/2022  . TETANUS/TDAP  09/19/2029  . COVID-19 Vaccine  Completed  . Hepatitis C Screening  Completed  . HIV Screening  Completed  . HPV VACCINES  Aged Out   Immunization History  Administered Date(s) Administered  . Influenza Split 05/27/2012  . Influenza Whole 03/15/2010  . Influenza, High Dose Seasonal PF 04/29/2017  . Influenza,inj,Quad PF,6+ Mos 06/02/2013, 05/24/2014, 05/09/2015, 05/28/2016, 06/19/2018, 03/31/2019  . Moderna Sars-Covid-2 Vaccination 09/30/2019, 10/26/2019, 06/23/2020  . Td 12/13/2008  . Tdap 09/20/2019   We updated and reviewed the patient's past history in detail and it is documented below. Allergies: Patient is allergic to codeine sulfate, moxifloxacin, penicillins, sulfonamide derivatives, biaxin [clarithromycin], diphenhydramine hcl, and tramadol hcl. Past Medical History Patient  has a past medical  history of Anxiety, Bladder stones, Bursitis of shoulder, Chiari malformation type I (HCC), Chronic prescription benzodiazepine use (10/23/2018), COLLAGENOUS COLITIS (01/01/2008), DEPRESSIVE DISORDER (05/21/2010), Fibromyalgia, History of kidney stones, HYPERLIPIDEMIA (12/13/2008), HYPERTENSION (04/28/2007), LOW BACK PAIN (12/02/2007), Migraine, Migraine without aura and without status migrainosus, not intractable (06/19/2018), NEPHROLITHIASIS, HX OF (04/28/2007), PONV (postoperative nausea and vomiting), Raynaud disease, Renal colic (02/12/2008), and UTI'S, RECURRENT (04/28/2007). Past Surgical History Patient  has a past surgical history that includes Abdominal hysterectomy (1990); colonoscopy; Wisdom tooth extraction; Suboccipital craniectomy cervical laminectomy (N/A, 12/11/2018); Ethmoidectomy (Right, 03/01/2019); Frontal sinus exploration (Right, 03/01/2019); Sinus endo with fusion (Right, 03/01/2019); MRI (2021); and radiotherapy ablation (N/A). Family History: Patient family history includes Alcohol abuse in her father; Arrhythmia in her daughter; Healthy in her sister; Hyperlipidemia in her brother, brother, daughter, and mother; Hypertension in her brother, brother, and mother; Parkinson's disease in her mother; Polycystic ovary syndrome in her daughter; Suicidality in her father. Social History:  Patient  reports that she quit smoking about 9 years ago. Her smoking use included cigarettes. She smoked 1.00 pack per day. She has never used smokeless tobacco. She reports that she does not drink alcohol and does not use drugs.  Review of Systems: Constitutional: negative for fever or malaise Ophthalmic: negative for photophobia, double vision or loss of vision Cardiovascular: negative for chest pain, dyspnea on exertion, or new LE swelling Respiratory: negative for SOB or persistent cough Gastrointestinal: negative for abdominal pain, change in bowel habits or melena Genitourinary: negative for dysuria or  gross hematuria, no abnormal uterine bleeding or disharge Musculoskeletal: negative for new gait disturbance or muscular weakness Integumentary: negative for new or persistent rashes, no breast lumps Neurological: negative for TIA or stroke symptoms Psychiatric: negative for SI or delusions Allergic/Immunologic: negative for hives  Patient Care Team    Relationship Specialty Notifications Start End  Willow Ora, MD PCP - General Family Medicine  06/19/18   Iva Boop, MD Consulting Physician Gastroenterology  06/19/18   Santiago Glad, MD Referring Physician Specialist  10/23/18    Comment: headache specialist  Newman Pies, MD Consulting Physician Otolaryngology  10/23/18   Lisbeth Renshaw, MD Consulting Physician Neurosurgery  10/23/18   Ardell Isaacs, MD  Pain Medicine  05/18/19     Objective  Vitals: BP 122/78   Pulse 71   Temp 98.3 F (36.8 C) (Temporal)   Wt 167 lb 3.2 oz (75.8 kg)   SpO2 98%  BMI 28.70 kg/m  General:  Well developed, well nourished, no acute distress  Psych:  Alert and orientedx3, mildly flat mood and affect, normal cognition and speech, fairly good insight HEENT:  Normocephalic, atraumatic, non-icteric sclera,  supple neck without adenopathy, mass or thyromegaly Cardiovascular:  Normal S1, S2, RRR without gallop, rub or murmur Respiratory:  Good breath sounds bilaterally, CTAB with normal respiratory effort Gastrointestinal: normal bowel sounds, soft, non-tender, no noted masses. No HSM Skin:  Warm, no rashes or suspicious lesions noted Neurologic:    Mental status is normal. CN 2-11 are normal. Gross motor and sensory exams are normal. Normal gait.  Mild tremor Breast Exam: No mass, skin retraction or nipple discharge is appreciated in either breast. No axillary adenopathy. Fibrocystic changes are not noted    Commons side effects, risks, benefits, and alternatives for medications and treatment plan prescribed today were discussed, and the  patient expressed understanding of the given instructions. Patient is instructed to call or message via MyChart if he/she has any questions or concerns regarding our treatment plan. No barriers to understanding were identified. We discussed Red Flag symptoms and signs in detail. Patient expressed understanding regarding what to do in case of urgent or emergency type symptoms.   Medication list was reconciled, printed and provided to the patient in AVS. Patient instructions and summary information was reviewed with the patient as documented in the AVS. This note was prepared with assistance of Dragon voice recognition software. Occasional wrong-word or sound-a-like substitutions may have occurred due to the inherent limitations of voice recognition software  This visit occurred during the SARS-CoV-2 public health emergency.  Safety protocols were in place, including screening questions prior to the visit, additional usage of staff PPE, and extensive cleaning of exam room while observing appropriate contact time as indicated for disinfecting solutions.

## 2020-10-11 NOTE — Patient Instructions (Addendum)
Please return in 3 months to recheck your mood.   Please call [facility] to schedule an appointment with Dr. Colen Darling or another therapist; Misty Stanley is a therapist here at our [place] office.  The phone number is: [phone]  I have ordered a mammogram and/or bone density for you as we discussed today: [x]   Mammogram  []   Bone Density  Please call the office checked below to schedule your appointment:  [x]   [facility]      [address] [place],        [address] Wing         []   [facility]  [address] [place],  [facility]   If you have any questions or concerns, please don't hesitate to send me a message via MyChart or call the office at [phone]. Thank you for visiting with [facility] today! It's our pleasure caring for you.   Major Depressive Disorder, Adult Major depressive disorder (MDD) is a mental health condition. It may also be called clinical depression or unipolar depression. MDD causes symptoms of sadness, hopelessness, and loss of interest in things. These symptoms last most of the day, almost every day, for 2 weeks. MDD can also cause physical symptoms. It can interfere with relationships and with everyday activities, such as work, school, and activities that are usually pleasant. MDD may be mild, moderate, or severe. It may be single-episode MDD, which happens once, or recurrent MDD, which may occur multiple times. What are the causes? The exact cause of this condition is not known. MDD is most likely caused by a combination of things, which may include:  Your personality traits.  Learned or conditioned behaviors or thoughts or feelings that reinforce negativity.  Any alcohol or substance misuse.  Long-term (chronic) physical or mental health illness.  Going through a traumatic experience or major life changes. What increases the risk? The following factors may make  someone more likely to develop MDD:  A family history of depression.  Being a woman.  Troubled family relationships.  Abnormally low levels of certain brain chemicals.  Traumatic or painful events in childhood, especially abuse or loss of a parent.  A lot of stress from life experiences, such as poor living conditions or discrimination.  Chronic physical illness or other mental health disorders. What are the signs or symptoms? The main symptoms of MDD usually include:  Constant depressed or irritable mood.  A loss of interest in things and activities. Other symptoms include:  Sleeping or eating too much or too little.  Unexplained weight gain or weight loss.  Tiredness or low energy.  Being agitated, restless, or weak.  Feeling hopeless, worthless, or guilty.  Trouble thinking clearly or making decisions.  Thoughts of suicide or thoughts of harming others.  Isolating oneself or avoiding other people or activities.  Trouble completing tasks, work, or any normal obligations. Severe symptoms of this condition may include:  Psychotic depression.This may include false beliefs, or delusions. It may also include seeing, hearing, tasting, smelling, or feeling things that are not real (hallucinations).  Chronic depression or persistent depressive disorder. This is low-level depression that lasts for at least 2 years.  Melancholic depression, or feeling extremely sad and hopeless.  Catatonic depression, which includes trouble speaking and trouble moving. How is this diagnosed? This condition may be diagnosed based on:  Your symptoms.  Your medical and mental  health history. You may be asked questions about your lifestyle, including any drug and alcohol use.  A physical exam.  Blood tests to rule out other conditions. MDD is confirmed if you have the following symptoms most of the day, nearly every day, in a 2-week period:  Either a depressed mood or loss of  interest.  At least four other MDD symptoms. How is this treated? This condition is usually treated by mental health professionals, such as psychologists, psychiatrists, and clinical social workers. You may need more than one type of treatment. Treatment may include:  Psychotherapy, also called talk therapy or counseling. Types of psychotherapy include: ? Cognitive behavioral therapy (CBT). This teaches you to recognize unhealthy feelings, thoughts, and behaviors, and replace them with positive thoughts and actions. ? Interpersonal therapy (IPT). This helps you to improve the way you communicate with others or relate to them. ? Family therapy. This treatment includes members of your family.  Medicines to treat anxiety and depression. These medicines help to balance the brain chemicals that affect your emotions.  Lifestyle changes. You may be asked to: ? Limit alcohol use and avoid drug use. ? Get regular exercise. ? Get plenty of sleep. ? Make healthy eating choices. ? Spend more time outdoors.  Brain stimulation. This may be done if symptoms are very severe and other treatments have not worked. Examples of this treatment are electroconvulsive therapy and transcranial magnetic stimulation. Follow these instructions at home: Activity  Exercise regularly and spend time outdoors.  Find activities that you enjoy doing, and make time to do them.  Find healthy ways to manage stress, such as: ? Meditation or deep breathing. ? Spending time in nature. ? Journaling.  Return to your normal activities as told by your health care provider. Ask your health care provider what activities are safe for you. Alcohol and drug use  If you drink alcohol: ? Limit how much you use to:  0-1 drink a day for women who are not pregnant.  0-2 drinks a day for men. ? Be aware of how much alcohol is in your drink. In the [place] one drink equals one 12 oz bottle of beer (355 mL), one 5 oz glass of wine  (148 mL), or one 1 oz glass of hard liquor (44 mL). ? Discuss your alcohol use with your health care provider. Alcohol can affect any antidepressant medicines you are taking.  Discuss any drug use with your health care provider. General instructions  Take over-the-counter and prescription medicines only as told by your health care provider.  Eat a healthy diet and get plenty of sleep.  Consider joining a support group. Your health care provider may be able to recommend one.  Keep all follow-up visits as told by your health care provider. This is important.   Where to find more information  [employer] on Mental Illness: www.nami.org  [place] [employer] of Mental Health: http://www.maynard.net/ Contact a health care provider if:  Your symptoms get worse.  You develop new symptoms. Get help right away if:  You self-harm.  You have serious thoughts about hurting yourself or others.  You hallucinate. If you ever feel like you may hurt yourself or others, or have thoughts about taking your own life, get help right away. Go to your nearest emergency department or:  Call your local emergency services (911 in the [place]  Call a suicide crisis helpline, such as the National Suicide Prevention Lifeline at 8475459984. This is open 24 hours a  day in the [place]  Text the Crisis Text Line at 4301591005 (in the [place] Summary  Major depressive disorder (MDD) is a mental health condition. MDD causes symptoms of sadness, hopelessness, and loss of interest in things. These symptoms last most of the day, almost every day, for 2 weeks.  The symptoms of MDD can interfere with relationships and with everyday activities.  Treatments and support are available for people who develop MDD. You may need more than one type of treatment.  Get help right away if you have serious thoughts about hurting yourself or others. This information is not intended to replace advice given to you by your health  care provider. Make sure you discuss any questions you have with your health care provider. Document Revised: 06/05/2019 Document Reviewed: 06/05/2019 Elsevier Patient Education  2021 [employer].

## 2020-10-14 ENCOUNTER — Encounter: Payer: Self-pay | Admitting: Family Medicine

## 2020-10-14 DIAGNOSIS — I129 Hypertensive chronic kidney disease with stage 1 through stage 4 chronic kidney disease, or unspecified chronic kidney disease: Secondary | ICD-10-CM | POA: Insufficient documentation

## 2020-10-14 DIAGNOSIS — N183 Chronic kidney disease, stage 3 unspecified: Secondary | ICD-10-CM | POA: Insufficient documentation

## 2020-10-17 ENCOUNTER — Encounter: Payer: Self-pay | Admitting: Family Medicine

## 2020-12-01 ENCOUNTER — Other Ambulatory Visit: Payer: Self-pay | Admitting: Family Medicine

## 2020-12-18 ENCOUNTER — Encounter: Payer: Self-pay | Admitting: Family Medicine

## 2020-12-18 MED ORDER — BUPROPION HCL ER (XL) 150 MG PO TB24: 150.0000 mg | ORAL_TABLET | Freq: Every day | ORAL | 1 refills | Status: DC

## 2020-12-26 ENCOUNTER — Ambulatory Visit (INDEPENDENT_AMBULATORY_CARE_PROVIDER_SITE_OTHER): Payer: 59 | Admitting: Clinical

## 2020-12-26 DIAGNOSIS — F332 Major depressive disorder, recurrent severe without psychotic features: Secondary | ICD-10-CM | POA: Diagnosis not present

## 2021-01-11 ENCOUNTER — Ambulatory Visit (INDEPENDENT_AMBULATORY_CARE_PROVIDER_SITE_OTHER): Payer: 59 | Admitting: Clinical

## 2021-01-11 DIAGNOSIS — F332 Major depressive disorder, recurrent severe without psychotic features: Secondary | ICD-10-CM | POA: Diagnosis not present

## 2021-01-15 ENCOUNTER — Encounter: Payer: Self-pay | Admitting: Family Medicine

## 2021-01-15 ENCOUNTER — Ambulatory Visit: Payer: 59 | Admitting: Family Medicine

## 2021-01-15 ENCOUNTER — Other Ambulatory Visit: Payer: Self-pay

## 2021-01-15 VITALS — BP 118/74 | HR 83 | Temp 98.0°F | Wt 167.2 lb

## 2021-01-15 DIAGNOSIS — N200 Calculus of kidney: Secondary | ICD-10-CM | POA: Diagnosis not present

## 2021-01-15 DIAGNOSIS — N1831 Chronic kidney disease, stage 3a: Secondary | ICD-10-CM

## 2021-01-15 DIAGNOSIS — F5104 Psychophysiologic insomnia: Secondary | ICD-10-CM

## 2021-01-15 DIAGNOSIS — I129 Hypertensive chronic kidney disease with stage 1 through stage 4 chronic kidney disease, or unspecified chronic kidney disease: Secondary | ICD-10-CM | POA: Diagnosis not present

## 2021-01-15 DIAGNOSIS — F339 Major depressive disorder, recurrent, unspecified: Secondary | ICD-10-CM

## 2021-01-15 DIAGNOSIS — I10 Essential (primary) hypertension: Secondary | ICD-10-CM

## 2021-01-15 NOTE — Progress Notes (Signed)
Subjective  CC:  Chief Complaint  Patient presents with   Depression    Has noticed improvement since med change and has seen behavioral health twice since last visit with PCP PHQ-9 @ 12    HPI: [name] is a 65 y.o. female who presents to the office today to address the problems listed above in the chief complaint, mood problems. Depression follow-up: 3 months ago we added Wellbutrin to her sertraline due to negative depressive symptoms.  Unfortunately, in the interim, her husband suffered from a heart attack and she had to deal with his health issues.  Overall, however, she feels that she is improved.  She is less irritable and less anxious.  She still has problems with sleep.  She describes this as her chronic insomnia.  In the past she used Ambien but her prior PCP stopped this abruptly.  She would prefer not to use medications for sleep but admits that she is chronically a poor sleeper.  She does feel tired during the day.  She thinks this does affect her mood.  She still has some anhedonia.  Energy remains low.  She is tolerating her Wellbutrin and does think it has helped with her symptoms.  She is also started psychotherapy which she feels is helpful as well  Depression screen [facility] 2/9 01/15/2021 10/11/2020 09/20/2019  Decreased Interest 3 3 0  Down, Depressed, Hopeless 0 1 0  PHQ - 2 Score 3 4 0  Altered sleeping 3 3 3   Tired, decreased energy 3 3 1   Change in appetite 0 3 1  Feeling bad or failure about yourself  0 0 0  Trouble concentrating 3 3 0  Moving slowly or fidgety/restless 0 1 0  Suicidal thoughts 0 0 0  PHQ-9 Score 12 17 5   Difficult doing work/chores Very difficult Very difficult Somewhat difficult   She has questions about her kidney disease.  She has tracked her GFR over the last 10 years.  She notes that most recently it is at 48%.  It was at this level 2 years ago.  She has had hypertension since 2008.  He currently is very well controlled.  She avoids  nephrotoxins.  She also has a long history of recurrent nephrolithiasis and what sounds like vesicoureteral reflux as a child with recurrent infections.  She denies shortness of breath, lower extremity edema or nausea. Hypertension is well controlled.  Assessment  1. Major depression, recurrent, chronic (HCC)   2. Hypertensive kidney disease with stage 3a chronic kidney disease (HCC)   3. Nephrolithiasis   4. Essential hypertension   5. Chronic insomnia      Plan  Depression: Mildly improved.  On sertraline and Wellbutrin.  Continue current medications and continuing psychotherapy.  Recheck 6 months.  Counseling done. Suspect chronic insomnia and is contributing to her low mood and her elevated depression screening.  Discussed importance of sleep and negative effects of chronic sleep deprivation.  She will work on sleep hygiene and we discussed nonprescription strategies to improve sleep. Hypertension is well controlled Chronic kidney disease likely related to hypertension and history of kidney damage from nephrolithiasis or vesicoureteral reflux in the past.  Reassured.  Avoid nephrotoxins.  Monitor every 6 to 12 months.  All questions answered Reviewed concept of mood problems caused by biochemical imbalance of neurotransmitters and rationale for treatment with medications and therapy.  Counseling given: pt was instructed to contact office, on-call physician or crisis Hotline if symptoms worsen significantly. If patient  develops any suicidal or homicidal thoughts, she is directed to the ER immediately.   Follow up: 6 months to recheck mood and blood pressure No orders of the defined types were placed in this encounter.  No orders of the defined types were placed in this encounter.     I reviewed the patients updated PMH, FH, and SocHx.    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    Priority: High   Chronic prescription benzodiazepine use 10/23/2018    Priority: High   Chronic narcotic use 06/19/2018    Priority: High   Fibromyalgia 12/01/2012    Priority: High   Major depression, recurrent, chronic (HCC) 05/21/2010    Priority: High   Dyslipidemia 12/13/2008    Priority: High   Chronic pain 12/02/2007    Priority: High   Essential hypertension 04/28/2007    Priority: High   Chronic migraine 05/18/2019    Priority: Medium   Memory loss 05/18/2019    Priority: Medium   Hypokalemia 06/19/2018    Priority: Medium   Collagenous colitis 01/01/2008    Priority: Medium   History of recurrent UTI (urinary tract infection) 04/28/2007    Priority: Medium   Nephrolithiasis 04/28/2007    Priority: Medium   Vitamin B12 deficiency 03/27/2020    Priority: Low   Chronic insomnia 01/15/2021   Migraine without aura and without status migrainosus, not intractable 06/19/2018   Current Meds  Medication Sig   aspirin 81 MG chewable tablet Chew by mouth daily.   atorvastatin (LIPITOR) 40 MG tablet TAKE 1 TABLET BY MOUTH  DAILY   baclofen (LIORESAL) 10 MG tablet Take 10 mg by mouth 3 (three) times daily.   buPROPion (WELLBUTRIN XL) 150 MG 24 hr tablet Take 1 tablet (150 mg total) by mouth daily.   Cholecalciferol (VITAMIN D) 1000 UNITS capsule Take 1,000 Units by mouth daily.   eletriptan (RELPAX) 20 MG tablet Take 1 tablet (20 mg total) by mouth as needed for migraine or headache. May repeat in 2 hours if headache persists or recurs.   loperamide (IMODIUM) 2 MG capsule Take 1 capsule (2 mg total) by mouth 4 (four) times daily as needed (collagenous colitis).   NUCYNTA 50 MG tablet Take 75 mg by mouth every 6 (six) hours as needed.    potassium chloride SA (KLOR-CON) 20 MEQ tablet TAKE 1 TABLET BY MOUTH  TWICE DAILY   promethazine (PHENERGAN) 25 MG tablet Take 1 tablet (25 mg total) by mouth every 6 (six) hours as needed. for  nausea   venlafaxine (EFFEXOR) 50 MG tablet TAKE 1 TABLET BY MOUTH  TWICE DAILY   verapamil (CALAN-SR) 240 MG CR tablet TAKE 1 TABLET BY MOUTH AT  BEDTIME    Allergies: Patient is allergic to codeine sulfate, moxifloxacin, penicillins, sulfonamide derivatives, biaxin [clarithromycin], diphenhydramine hcl, and tramadol hcl. Family history:  Patient family history includes Alcohol abuse in her father; Arrhythmia in her daughter; Healthy in her sister; Hyperlipidemia in her brother, brother, daughter, and mother; Hypertension in her brother, brother, and mother; Parkinson's disease in her mother; Polycystic ovary syndrome in her daughter; Suicidality in her father. Social History   Socioeconomic History   Marital status: Married    Spouse name: disabled vet   Number of children: 2   Years of education: some college   Highest education level: Not on  file  Occupational History   Occupation: Tourist information centre manager    Comment: [employer]  Tobacco Use   Smoking status: Former    Packs/day: 1.00    Pack years: 0.00    Types: Cigarettes    Quit date: 2013    Years since quitting: 9.5   Smokeless tobacco: Never  Vaping Use   Vaping Use: Not on file  Substance and Sexual Activity   Alcohol use: No   Drug use: No   Sexual activity: Yes  Other Topics Concern   Not on file  Social History Narrative   Lives at home with husband.   Right-handed.   No daily caffeine use.   Social Determinants of Health   Financial Resource Strain: Not on file  Food Insecurity: Not on file  Transportation Needs: Not on file  Physical Activity: Not on file  Stress: Not on file  Social Connections: Not on file     Review of Systems: Constitutional: Negative for fever malaise or anorexia Cardiovascular: negative for chest pain Respiratory: negative for SOB or persistent cough Gastrointestinal: negative for abdominal pain  Objective  Vitals: BP 118/74   Pulse 83   Temp 98 F (36.7 C) (Temporal)   Wt 167 lb  3.2 oz (75.8 kg)   SpO2 95%   BMI 28.70 kg/m  General: no acute distress, well appearing, no apparent distress, well groomed Psych:  Alert and oriented x 3,.  Flat affect, fair insight    Commons side effects, risks, benefits, and alternatives for medications and treatment plan prescribed today were discussed, and the patient expressed understanding of the given instructions. Patient is instructed to call or message via MyChart if he/she has any questions or concerns regarding our treatment plan. No barriers to understanding were identified. We discussed Red Flag symptoms and signs in detail. Patient expressed understanding regarding what to do in case of urgent or emergency type symptoms.  Medication list was reconciled, printed and provided to the patient in AVS. Patient instructions and summary information was reviewed with the patient as documented in the AVS. This note was prepared with assistance of Dragon voice recognition software. Occasional wrong-word or sound-a-like substitutions may have occurred due to the inherent limitations of voice recognition software

## 2021-01-15 NOTE — Patient Instructions (Signed)
Please return in 6 months for hypertension follow up and mood recheck.  Below is general information on chronic kidney disease.   If you have any questions or concerns, please don't hesitate to send me a message via MyChart or call the office at [phone]. Thank you for visiting with [name] today! It's our pleasure caring for you.   Chronic Kidney Disease, Adult Chronic kidney disease is when lasting damage happens to the kidneys slowly over a long time. The kidneys help to: Make pee (urine). Make hormones. Keep the right amount of fluids and chemicals in the body. Most often, this disease does not go away. You must take steps to help keep the kidney damage from getting worse. If steps are not taken, the kidneys mightstop working forever. What are the causes? Diabetes. High blood pressure. Diseases that affect the heart and blood vessels. Other kidney diseases. Diseases of the body's disease-fighting system. A problem with the flow of pee. Infections of the organs that make pee, store it, and take it out of the body. Swelling or irritation of your blood vessels. What increases the risk? Getting older. Having someone in your family who has kidney disease or kidney failure. Having a disease caused by genes. Taking medicines often that harm the kidneys. Being near or having contact with harmful substances. Being very overweight. Using tobacco now or in the past. What are the signs or symptoms? Feeling very tired. Having a swollen face, legs, ankles, or feet. Feeling like you may vomit or vomiting. Not feeling hungry. Being confused or not able to focus. Twitches and cramps in the leg muscles or other muscles. Dry, itchy skin. A taste of metal in your mouth. Making less pee, or making more pee. Shortness of breath. Trouble sleeping. You may also become anemic or get weak bones. Anemic means there is not enoughred blood cells or hemoglobin in your blood. You may get symptoms  slowly. You may not notice them until the kidney damagegets very bad. How is this treated? Often, there is no cure for this disease. Treatment can help with symptoms and help keep the disease from getting worse. You may need to: Avoid alcohol. Avoid foods that are high in salt, potassium, phosphorous, and protein. Take medicines for symptoms and to help control other conditions. Have dialysis. This treatment gets harmful waste out of your body. Treat other problems that cause your kidney disease or make it worse. Follow these instructions at home: Medicines Take over-the-counter and prescription medicines only as told by your doctor. Do not take any new medicines, vitamins, or supplements unless your doctor says it is okay. Lifestyle  Do not smoke or use any products that contain nicotine or tobacco. If you need help quitting, ask your doctor. If you drink alcohol: Limit how much you use to: 0-1 drink a day for women who are not pregnant. 0-2 drinks a day for men. Know how much alcohol is in your drink. In the [place] one drink equals one 12 oz bottle of beer (355 mL), one 5 oz glass of wine (148 mL), or one 1 oz glass of hard liquor (44 mL). Stay at a healthy weight. If you need help losing weight, ask your doctor.  General instructions  Follow instructions from your doctor about what you cannot eat or drink. Track your blood pressure at home. Tell your doctor about any changes. If you have diabetes, track your blood sugar. Exercise at least 30 minutes a day, 5 days a week. Keep your  shots (vaccinations) up to date. Keep all follow-up visits.  Where to find more information American Association of Kidney Patients: [employer].is [employer]: www.kidney.org American Kidney Fund: FightingMatch.com.ee Life Options: www.lifeoptions.org Kidney School: www.kidneyschool.org Contact a doctor if: Your symptoms get worse. You get new symptoms. Get help right away if: You get  symptoms of end-stage kidney disease. These include: Headaches. Losing feeling in your hands or feet. Easy bruising. Having hiccups often. Chest pain. Shortness of breath. Lack of menstrual periods, in women. You have a fever. You make less pee than normal. You have pain or you bleed when you pee or poop. These symptoms may be an emergency. Get help right away. Call your local emergency services (911 in the [place] Do not wait to see if the symptoms will go away. Do not drive yourself to the hospital. Summary Chronic kidney disease is when lasting damage happens to the kidneys slowly over a long time. Causes of this disease include diabetes and high blood pressure. Often, there is no cure for this disease. Treatment can help symptoms and help keep the disease from getting worse. Treatment may involve lifestyle changes, medicines, and dialysis. This information is not intended to replace advice given to you by your health care provider. Make sure you discuss any questions you have with your healthcare provider. Document Revised: 09/29/2019 Document Reviewed: 09/29/2019 Elsevier Patient Education  2022 [employer].

## 2021-01-23 ENCOUNTER — Other Ambulatory Visit: Payer: Self-pay

## 2021-01-23 ENCOUNTER — Ambulatory Visit
Admission: RE | Admit: 2021-01-23 | Discharge: 2021-01-23 | Disposition: A | Discharge status: Home / Self Care | Payer: 59 | Source: Ambulatory Visit | Attending: Family Medicine | Admitting: Family Medicine

## 2021-01-23 DIAGNOSIS — Z1231 Encounter for screening mammogram for malignant neoplasm of breast: Secondary | ICD-10-CM

## 2021-01-31 ENCOUNTER — Ambulatory Visit (INDEPENDENT_AMBULATORY_CARE_PROVIDER_SITE_OTHER): Payer: 59 | Admitting: Clinical

## 2021-01-31 DIAGNOSIS — F332 Major depressive disorder, recurrent severe without psychotic features: Secondary | ICD-10-CM

## 2021-02-13 ENCOUNTER — Other Ambulatory Visit: Payer: Self-pay | Admitting: Family Medicine

## 2021-02-20 ENCOUNTER — Other Ambulatory Visit: Payer: Self-pay | Admitting: Family Medicine

## 2021-02-28 ENCOUNTER — Other Ambulatory Visit: Payer: Self-pay | Admitting: Family Medicine

## 2021-02-28 DIAGNOSIS — E876 Hypokalemia: Secondary | ICD-10-CM

## 2021-03-15 ENCOUNTER — Ambulatory Visit (INDEPENDENT_AMBULATORY_CARE_PROVIDER_SITE_OTHER): Payer: 59 | Admitting: Clinical

## 2021-03-15 DIAGNOSIS — F332 Major depressive disorder, recurrent severe without psychotic features: Secondary | ICD-10-CM | POA: Diagnosis not present

## 2021-05-08 ENCOUNTER — Ambulatory Visit (INDEPENDENT_AMBULATORY_CARE_PROVIDER_SITE_OTHER): Payer: Managed Care, Other (non HMO) | Admitting: Clinical

## 2021-05-08 DIAGNOSIS — F332 Major depressive disorder, recurrent severe without psychotic features: Secondary | ICD-10-CM

## 2021-07-16 ENCOUNTER — Other Ambulatory Visit: Payer: Self-pay | Admitting: Family Medicine

## 2021-07-18 ENCOUNTER — Other Ambulatory Visit: Payer: Self-pay

## 2021-07-18 ENCOUNTER — Ambulatory Visit (INDEPENDENT_AMBULATORY_CARE_PROVIDER_SITE_OTHER): Payer: 59 | Admitting: Clinical

## 2021-07-18 ENCOUNTER — Encounter: Payer: Self-pay | Admitting: Family Medicine

## 2021-07-18 ENCOUNTER — Ambulatory Visit: Payer: 59 | Admitting: Family Medicine

## 2021-07-18 VITALS — BP 139/82 | HR 81 | Temp 98.0°F | Ht 64.0 in | Wt 151.0 lb

## 2021-07-18 DIAGNOSIS — I129 Hypertensive chronic kidney disease with stage 1 through stage 4 chronic kidney disease, or unspecified chronic kidney disease: Secondary | ICD-10-CM

## 2021-07-18 DIAGNOSIS — Z79899 Other long term (current) drug therapy: Secondary | ICD-10-CM

## 2021-07-18 DIAGNOSIS — Q0700 Arnold-Chiari syndrome without spina bifida or hydrocephalus: Secondary | ICD-10-CM

## 2021-07-18 DIAGNOSIS — E876 Hypokalemia: Secondary | ICD-10-CM

## 2021-07-18 DIAGNOSIS — E538 Deficiency of other specified B group vitamins: Secondary | ICD-10-CM | POA: Diagnosis not present

## 2021-07-18 DIAGNOSIS — I10 Essential (primary) hypertension: Secondary | ICD-10-CM | POA: Diagnosis not present

## 2021-07-18 DIAGNOSIS — N1831 Chronic kidney disease, stage 3a: Secondary | ICD-10-CM | POA: Diagnosis not present

## 2021-07-18 DIAGNOSIS — F339 Major depressive disorder, recurrent, unspecified: Secondary | ICD-10-CM

## 2021-07-18 DIAGNOSIS — F331 Major depressive disorder, recurrent, moderate: Secondary | ICD-10-CM | POA: Diagnosis not present

## 2021-07-18 DIAGNOSIS — Z23 Encounter for immunization: Secondary | ICD-10-CM

## 2021-07-18 DIAGNOSIS — F5104 Psychophysiologic insomnia: Secondary | ICD-10-CM

## 2021-07-18 MED ORDER — ZOLPIDEM TARTRATE 10 MG PO TABS: 10.0000 mg | ORAL_TABLET | Freq: Every evening | ORAL | 2 refills | Status: DC | PRN

## 2021-07-18 MED ORDER — VENLAFAXINE HCL 75 MG PO TABS: 75.0000 mg | ORAL_TABLET | Freq: Two times a day (BID) | ORAL | 3 refills | Status: DC

## 2021-07-18 NOTE — Patient Instructions (Addendum)
Please return in 6-8 weeks to recheck mood and in 3-4 months for complete physical.   Today you were given the prevnar 20 and flu vaccines to protect your from certain pneumonias and influenza.    I will release your lab results to you on your MyChart account with further instructions. Please reply with any questions.    I have increased the effexor dose; please continue the wellbutrin.   If you have any questions or concerns, please don't hesitate to send me a message via MyChart or call the office at [phone]. Thank you for visiting with [place] today! It's our pleasure caring for you.   When youre feeling too down to do anything, try these 10 Little things!  Take a shower. Even if you plan to stay in all day long and not see a soul, take a shower. It takes the most effort to hop in to the shower but once you do, youll feel immediate results. It will wake you up and youll be feeling much fresher (and cleaner too).  Brush and floss your teeth. Give your teeth a good brushing with a floss finish. Its a small task but it feels so good and you can check taking care of your health off the list of things to do.  Do something small on your list. Most of [place] have some small thing we would like to get done (load of laundry, sew a button, email a friend). Doing one of these things will make you feel like youve accomplished something.  Drink water. Drinking water is easy right? Its also really beneficial for your health so keep a glass beside you all day and take sips often. It gives you energy and prevents you from boredom eating.  Do some floor exercises. The last thing you want to do is exercise but it might be just the thing you need the most. Keep it simple and do exercises that involve sitting or laying on the floor. Even the smallest of exercises release chemicals in the brain that make you feel good. Yoga stretches or core exercises are going to make you feel good with minimal  effort.  Make your bed. Making your bed takes a few minutes but its productive and youll feel relieved when its done. An unmade bed is a huge visual reminder that youre having an unproductive day. Do it and consider it your housework for the day.  Put on some nice clothes. Take the sweatpants off even if you dont plan to go anywhere. Put on clothes that make you feel good. Take a look in the mirror so your brain recognizes the sweatpants have been replaced with clothes that make you look great. Its an instant confidence booster.  Wash the dishes. A pile of dirty dishes in the sink is a reflection of your mood. Its possible that if you wash up the dishes, your mood will follow suit. Its worth a try.  Cook a real meal. If you have the luxury to have a do nothing day, you have time to make a real meal for yourself. Make a meal that you love to eat. The process is good to get you out of the funk and the food will ensure you have more energy for tomorrow.  Write out your thoughts by hand. When you hand write, you stimulate your brain to focus on the moment that youre in so make yourself comfortable and write whatever comes into your mind. Put those thoughts out on paper so  they stop spinning around in your head. Those thoughts might be the very thing holding you down.

## 2021-07-18 NOTE — Progress Notes (Signed)
Subjective  CC:  Chief Complaint  Patient presents with   Mood    It has not been good lately.   Hypertension   Insomnia    Trouble staying asleep would like to discuss a sleep aid.     HPI: [name] is a 66 y.o. female who presents to the office today to address the problems listed above in the chief complaint. Hypertension f/u: Control is good . Pt reports she is doing well. taking medications as instructed, no medication side effects noted, no TIAs, no chest pain on exertion, no dyspnea on exertion, no swelling of ankles. She denies adverse effects from his BP medications. Compliance with medication is good.  Depression: 74-month follow-up for depression.  She is on Wellbutrin 150 XL daily and Effexor 50 mg twice daily.  She has been doing better in the summer, however she is struggling again.  Her mother is now in a nursing home and is not doing well.  Requiring 24-hour care.  This has caused more depressive symptoms.  Mostly negative symptoms.  No suicidal ideation.  She continues to see her therapist regularly.  Of note she has chronic insomnia that continues to likely play a role in her depressive symptoms.  We discussed this at her last visit.  She had worked on Publishing rights manager.  However it sounds like it is time to add medications.  She has done well with Ambien in the past.  Depression screen [facility] 2/9 07/18/2021 01/15/2021 10/11/2020 09/20/2019 10/23/2018  Decreased Interest 3 3 3  0 0  Down, Depressed, Hopeless 3 0 1 0 1  PHQ - 2 Score 6 3 4  0 1  Altered sleeping 3 3 3 3  0  Tired, decreased energy 3 3 3 1 2   Change in appetite 2 0 3 1 0  Feeling bad or failure about yourself  1 0 0 0 1  Trouble concentrating 3 3 3  0 0  Moving slowly or fidgety/restless 3 0 1 0 0  Suicidal thoughts 0 0 0 0 0  PHQ-9 Score 21 12 17 5 4   Difficult doing work/chores Very difficult Very difficult Very difficult Somewhat difficult Not difficult at all   Mild chronic kidney disease: No lower extremity  edema. Chronic hypokalemia: Due for recheck.  On supplements.  No leg cramps. Chronic pain, fibromyalgia, narcotic use and benzo use.  Patient reports this is unstable. Health maintenance: Eligible for flu and Prevnar 20 today Due for follow-up on vitamin B12 deficiency  Assessment  1. Essential hypertension   2. Major depression, recurrent, chronic ([place])   3. Chronic prescription benzodiazepine use   4. Chronic insomnia   5. Hypertensive kidney disease with stage 3a chronic kidney disease ([place])   6. Hypokalemia   7. Vitamin B12 deficiency   8. Arnold-Chiari malformation ([place])      Plan   Hypertension f/u: BP control is well controlled.  Continue current medications Depression: Very active.  We will increase Effexor 75 mg twice daily.  Close follow-up.  Recheck in 6 to 8 weeks.  Continue Wellbutrin XL 150 mg daily.  Continue psychotherapy.  We will push Effexor dose up as tolerated.  If cannot get under better control, would consider changing to Pristiq or Trintellix. Chronic anxiety: Sleep deprivation present exacerbating mood.  Ambien 10 mg nightly to restart. Recheck renal function, B12 and potassium levels.  Education regarding management of these chronic disease states was given. Management strategies discussed on successive visits include dietary and exercise recommendations,  goals of achieving and maintaining IBW, and lifestyle modifications aiming for adequate sleep and minimizing stressors.   Follow up: 6 to 8 weeks for mood follow-up.  Complete physical in 3 to 4 months.  Orders Placed This Encounter  Procedures   Renal function panel   Vitamin B12   CBC with Differential/Platelet   Meds ordered this encounter  Medications   venlafaxine (EFFEXOR) 75 MG tablet    Sig: Take 1 tablet (75 mg total) by mouth 2 (two) times daily.    Dispense:  180 tablet    Refill:  3    Replacing the 50 bid dose please   zolpidem (AMBIEN) 10 MG tablet    Sig: Take 1 tablet (10 mg total)  by mouth at bedtime as needed for sleep.    Dispense:  30 tablet    Refill:  2      BP Readings from Last 3 Encounters:  07/18/21 139/82  01/15/21 118/74  10/11/20 122/78   Wt Readings from Last 3 Encounters:  07/18/21 151 lb (68.5 kg)  01/15/21 167 lb 3.2 oz (75.8 kg)  10/11/20 167 lb 3.2 oz (75.8 kg)    Lab Results  Component Value Date   CHOL 180 10/11/2020   CHOL 189 09/20/2019   CHOL 195 07/22/2018   Lab Results  Component Value Date   HDL 82.60 10/11/2020   HDL 83.90 09/20/2019   HDL 66.40 07/22/2018   Lab Results  Component Value Date   LDLCALC 83 10/11/2020   LDLCALC 90 09/20/2019   LDLCALC 95 07/22/2018   Lab Results  Component Value Date   TRIG 71.0 10/11/2020   TRIG 72.0 09/20/2019   TRIG 166.0 (H) 07/22/2018   Lab Results  Component Value Date   CHOLHDL 2 10/11/2020   CHOLHDL 2 09/20/2019   CHOLHDL 3 07/22/2018   Lab Results  Component Value Date   LDLDIRECT 98.0 11/21/2015   LDLDIRECT 158.7 11/22/2011   LDLDIRECT 214.9 12/06/2008   Lab Results  Component Value Date   CREATININE 1.20 10/11/2020   BUN 20 10/11/2020   NA 141 10/11/2020   K 4.4 10/11/2020   CL 105 10/11/2020   CO2 29 10/11/2020    The 10-year ASCVD risk score (Arnett DK, et al., 2019) is: 5.4%   Values used to calculate the score:     Age: 30 years     Sex: Female     Is Non-Hispanic African American: No     Diabetic: No     Tobacco smoker: No     Systolic Blood Pressure: 139 mmHg     Is BP treated: Yes     HDL Cholesterol: 82.6 mg/dL     Total Cholesterol: 180 mg/dL  I reviewed the patients updated PMH, FH, and SocHx.    Patient Active Problem List   Diagnosis Date Noted   Chronic insomnia 01/15/2021    Priority: High   Hypertensive kidney disease with CKD stage III (HCC) 10/14/2020    Priority: High   Arnold-Chiari malformation (HCC) 05/18/2019    Priority: High   Chronic prescription benzodiazepine use 10/23/2018    Priority: High   Chronic narcotic  use 06/19/2018    Priority: High   Migraine without aura and without status migrainosus, not intractable 06/19/2018    Priority: High   Fibromyalgia 12/01/2012    Priority: High   Major depression, recurrent, chronic (HCC) 05/21/2010    Priority: High   Dyslipidemia 12/13/2008    Priority: High  Chronic pain 12/02/2007    Priority: High   Essential hypertension 04/28/2007    Priority: High   Chronic migraine 05/18/2019    Priority: Medium    Memory loss 05/18/2019    Priority: Medium    Hypokalemia 06/19/2018    Priority: Medium    Collagenous colitis 01/01/2008    Priority: Medium    History of recurrent UTI (urinary tract infection) 04/28/2007    Priority: Medium    Nephrolithiasis 04/28/2007    Priority: Medium    Vitamin B12 deficiency 03/27/2020    Priority: Low    Allergies: Codeine sulfate, Moxifloxacin, Penicillins, Sulfonamide derivatives, Biaxin [clarithromycin], Diphenhydramine hcl, and Tramadol hcl  Social History: Patient  reports that she quit smoking about 10 years ago. Her smoking use included cigarettes. She smoked an average of 1 pack per day. She has never used smokeless tobacco. She reports that she does not drink alcohol and does not use drugs.  Current Meds  Medication Sig   aspirin 81 MG chewable tablet Chew by mouth daily.   atorvastatin (LIPITOR) 40 MG tablet TAKE 1 TABLET BY MOUTH  DAILY   baclofen (LIORESAL) 10 MG tablet Take 10 mg by mouth 3 (three) times daily.   buPROPion (WELLBUTRIN XL) 150 MG 24 hr tablet TAKE 1 TABLET BY MOUTH EVERY DAY   Cholecalciferol (VITAMIN D) 1000 UNITS capsule Take 1,000 Units by mouth daily.   eletriptan (RELPAX) 20 MG tablet Take 1 tablet (20 mg total) by mouth as needed for migraine or headache. May repeat in 2 hours if headache persists or recurs.   loperamide (IMODIUM) 2 MG capsule Take 1 capsule (2 mg total) by mouth 4 (four) times daily as needed (collagenous colitis).   NUCYNTA 50 MG tablet Take 75 mg by  mouth every 6 (six) hours as needed.    potassium chloride SA (KLOR-CON) 20 MEQ tablet TAKE 1 TABLET BY MOUTH  TWICE DAILY   promethazine (PHENERGAN) 25 MG tablet Take 1 tablet (25 mg total) by mouth every 6 (six) hours as needed. for nausea   verapamil (CALAN-SR) 240 MG CR tablet TAKE 1 TABLET BY MOUTH AT  BEDTIME   zolpidem (AMBIEN) 10 MG tablet Take 1 tablet (10 mg total) by mouth at bedtime as needed for sleep.   [DISCONTINUED] venlafaxine (EFFEXOR) 50 MG tablet TAKE 1 TABLET BY MOUTH  TWICE DAILY    Review of Systems: Cardiovascular: negative for chest pain, palpitations, leg swelling, orthopnea Respiratory: negative for SOB, wheezing or persistent cough Gastrointestinal: negative for abdominal pain Genitourinary: negative for dysuria or gross hematuria  Objective  Vitals: BP 139/82    Pulse 81    Temp 98 F (36.7 C) (Temporal)    Ht 5\' 4"  (1.626 m)    Wt 151 lb (68.5 kg)    SpO2 98%    BMI 25.92 kg/m  General: no acute distress  Psych:  Alert and oriented, flat mood and affect, good insight HEENT:  Normocephalic, atraumatic, supple neck  Cardiovascular:  RRR without murmur. no edema Respiratory:  Good breath sounds bilaterally, CTAB with normal respiratory effort Skin:  Warm, no rashes Neurologic:   Mental status is normal Commons side effects, risks, benefits, and alternatives for medications and treatment plan prescribed today were discussed, and the patient expressed understanding of the given instructions. Patient is instructed to call or message via MyChart if he/she has any questions or concerns regarding our treatment plan. No barriers to understanding were identified. We discussed Red Flag symptoms and signs  in detail. Patient expressed understanding regarding what to do in case of urgent or emergency type symptoms.  Medication list was reconciled, printed and provided to the patient in AVS. Patient instructions and summary information was reviewed with the patient as  documented in the AVS. This note was prepared with assistance of Dragon voice recognition software. Occasional wrong-word or sound-a-like substitutions may have occurred due to the inherent limitations of voice recognition software  This visit occurred during the SARS-CoV-2 public health emergency.  Safety protocols were in place, including screening questions prior to the visit, additional usage of staff PPE, and extensive cleaning of exam room while observing appropriate contact time as indicated for disinfecting solutions.

## 2021-07-18 NOTE — Progress Notes (Signed)
Diagnosis: F33.2 Time: 8:00am-8:51am CPT Code: [id number]  [name] was seen remotely using secure audio conferencing (video was not available due to technical difficulties). She shared that she has made peace with staying at her job and has been able to focus on silver linings. However, she continues to experience low energy, loss of interest, and lack of motivation, which she attributes to factors in her relationship. Therapist provided communication strategies, including the use of "I" words and emotion statements. She indicated a plan to initiate a conversation with her husband. She is scheduled to be seen again in one month.   Treatment Plan Client Abilities/Strengths  [name] presented as insightful and motivated.  Client Treatment Preferences  She prefers later afternoon appointments, and telehealth  Client Statement of Needs  [name] is seeking CBT to address increased anger and frustration since undergoing Chiari surgery in June of 2020  Treatment Level  Biweekly/Monthly  Symptoms  Emotion regulation difficulties: disrespect toward others at work, feedback saying she has a negative attitude at work, reduced patience with family members, feelings of resentment toward family members (Status: maintained).  Problems Addressed  New Description  Goals 1. Increased anger and frustration, especially at work Objective Increase general sense of well-being Target Date: 2021-12-17 Frequency: Biweekly Progress: 0 Modality: individual Related Interventions 1. [name] will have an opportunity to process her experiences in session 2. Therapist will provide referrals for additional resources as appropriate  Objective [name] will develop strategies to regulate her emotions, including meditation, mindfulness, and self-care Target Date: 2021-12-26 Frequency: Biweekly Progress: 0 Modality: individual Related Interventions 3. Therapist will help [name] to identify and disengage from maladaptive thoughts and  behaviors Objective [name] will develop communication strategies and increase her overall assertiveness Target Date: 2021-12-26 Frequency: Biweekly Progress: 0 Modality: individual Related Interventions 4. Therapist will provide [name] with emotion regulation strategies, including meditation, mindfulness, and self-care 5. Therapist will provide communication strategies to support [name]'s assertiveness, including talking about challenging situations to consider what [name] might say, opportunities for practice in session, and exercises  Diagnosis Axis none 296.32 (Major depressive affective disorder, recurrent episode, moderate) - Open - [Signifier: n/a]   Conditions For Discharge Achievement of treatment goals and objectives     Wanda Cruise, PhD

## 2021-07-19 ENCOUNTER — Other Ambulatory Visit: Payer: Self-pay

## 2021-07-19 ENCOUNTER — Telehealth: Payer: Self-pay | Admitting: *Deleted

## 2021-07-19 DIAGNOSIS — E162 Hypoglycemia, unspecified: Secondary | ICD-10-CM

## 2021-07-19 LAB — CBC WITH DIFFERENTIAL/PLATELET
Basophils Absolute: 0.1 10*3/uL (ref 0.0–0.1)
Basophils Relative: 1.3 % (ref 0.0–3.0)
Eosinophils Absolute: 0.3 10*3/uL (ref 0.0–0.7)
Eosinophils Relative: 4.1 % (ref 0.0–5.0)
HCT: 39 % (ref 36.0–46.0)
Hemoglobin: 12.5 g/dL (ref 12.0–15.0)
Lymphocytes Relative: 39 % (ref 12.0–46.0)
Lymphs Abs: 2.7 10*3/uL (ref 0.7–4.0)
MCHC: 32.1 g/dL (ref 30.0–36.0)
MCV: 91.4 fl (ref 78.0–100.0)
Monocytes Absolute: 0.5 10*3/uL (ref 0.1–1.0)
Monocytes Relative: 7.1 % (ref 3.0–12.0)
Neutro Abs: 3.4 10*3/uL (ref 1.4–7.7)
Neutrophils Relative %: 48.5 % (ref 43.0–77.0)
Platelets: 257 10*3/uL (ref 150.0–400.0)
RBC: 4.26 Mil/uL (ref 3.87–5.11)
RDW: 13.3 % (ref 11.5–15.5)
WBC: 7 10*3/uL (ref 4.0–10.5)

## 2021-07-19 LAB — RENAL FUNCTION PANEL
Albumin: 4.1 g/dL (ref 3.5–5.2)
BUN: 16 mg/dL (ref 6–23)
CO2: 29 mEq/L (ref 19–32)
Calcium: 9.3 mg/dL (ref 8.4–10.5)
Chloride: 107 mEq/L (ref 96–112)
Creatinine, Ser: 1.34 mg/dL — ABNORMAL HIGH (ref 0.40–1.20)
GFR: 42.16 mL/min — ABNORMAL LOW (ref >60.00)
Glucose, Bld: 37 mg/dL — CL (ref 70–99)
Phosphorus: 3.3 mg/dL (ref 2.3–4.6)
Potassium: 4.3 mEq/L (ref 3.5–5.1)
Sodium: 142 mEq/L (ref 135–145)

## 2021-07-19 LAB — VITAMIN B12: Vitamin B-12: >1550 pg/mL — ABNORMAL HIGH (ref 211–911)

## 2021-07-19 NOTE — Telephone Encounter (Signed)
Patient has not been eating as much as normal but she is eating. She is feeling very fatigue, but besides that she feels okay other than the fibromyalgia pains. Was diagnosed with hypoglycemia years ago, but does not believe she still has this diagnosis.

## 2021-07-19 NOTE — Progress Notes (Signed)
Please call patient: I have reviewed his/her lab results. (Pt has been called about glucose and has f/u plan in place) However, vit B12 levels are now too high. Vit B12 supplements are not on her med list: is she taking them? I recommend decreasing supplements to 3x/ week. Other labs are stable

## 2021-07-19 NOTE — Telephone Encounter (Signed)
Yvone Neu called from [facility] lab with critical lab results. Glucose:37 I made Dr. Mardelle Matte Aware of this results. Routing to Dr. Mardelle Matte  as high priority

## 2021-07-19 NOTE — Telephone Encounter (Signed)
Lab and appointment has been scheduled. Patient also states she just does not feel like doing anything, feels this contributes to her depression though.  See previous message also!

## 2021-07-20 ENCOUNTER — Other Ambulatory Visit: Payer: Self-pay

## 2021-07-20 ENCOUNTER — Other Ambulatory Visit (INDEPENDENT_AMBULATORY_CARE_PROVIDER_SITE_OTHER): Payer: 59

## 2021-07-20 DIAGNOSIS — E162 Hypoglycemia, unspecified: Secondary | ICD-10-CM | POA: Diagnosis not present

## 2021-07-20 LAB — BASIC METABOLIC PANEL
BUN: 17 mg/dL (ref 6–23)
CO2: 29 mEq/L (ref 19–32)
Calcium: 9.3 mg/dL (ref 8.4–10.5)
Chloride: 105 mEq/L (ref 96–112)
Creatinine, Ser: 1.17 mg/dL (ref 0.40–1.20)
GFR: 49.61 mL/min — ABNORMAL LOW (ref >60.00)
Glucose, Bld: 82 mg/dL (ref 70–99)
Potassium: 4.6 mEq/L (ref 3.5–5.1)
Sodium: 141 mEq/L (ref 135–145)

## 2021-07-20 NOTE — Progress Notes (Signed)
Updated Med List

## 2021-08-15 ENCOUNTER — Ambulatory Visit (INDEPENDENT_AMBULATORY_CARE_PROVIDER_SITE_OTHER): Payer: Managed Care, Other (non HMO) | Admitting: Clinical

## 2021-08-15 DIAGNOSIS — F331 Major depressive disorder, recurrent, moderate: Secondary | ICD-10-CM

## 2021-08-15 NOTE — Progress Notes (Signed)
Diagnosis: F33.2 Time: 11:00am-11:56am CPT Code: [id number]  [name] was seen remotely using secure video conferencing. She was in her home and the therapist was in her office at the time of the appointment. She shared that she continues to experience lack of motivation and depressed mood. She shared that communication has improved somewhat in her husband, but she is concerned he shows symptoms of depression and would like for him to get out more. Therapist pointed out that they may have this in common, and [name] revealed that she has not told any family members that she is in therapy. Session focused on better understanding dynamics in her family of origin to understand how they impact her currently. She shared that her father was an alocholic who died by suicide in his 25s. Therapist pointed out parallels in her descriptions of her parents' relationship and in her own, and encouraged her to try to notice whether her difficulty trusting her husband may stem in part to patterns she learned growing up. She is scheduled to be seen again in one month. Suicidal ideation and intent were denied.    Treatment Plan Client Abilities/Strengths  [name] presented as insightful and motivated.  Client Treatment Preferences  She prefers later afternoon appointments, and telehealth  Client Statement of Needs  [name] is seeking CBT to address increased anger and frustration since undergoing Chiari surgery in June of 2020  Treatment Level  Biweekly/Monthly  Symptoms  Emotion regulation difficulties: disrespect toward others at work, feedback saying she has a negative attitude at work, reduced patience with family members, feelings of resentment toward family members (Status: maintained).  Problems Addressed  New Description  Goals 1. Increased anger and frustration, especially at work Objective Increase general sense of well-being Target Date: 2021-12-17 Frequency: Biweekly Progress: 0 Modality:  individual Related Interventions 1. [name] will have an opportunity to process her experiences in session 2. Therapist will provide referrals for additional resources as appropriate  Objective [name] will develop strategies to regulate her emotions, including meditation, mindfulness, and self-care Target Date: 2021-12-26 Frequency: Biweekly Progress: 0 Modality: individual Related Interventions 3. Therapist will help [name] to identify and disengage from maladaptive thoughts and behaviors Objective [name] will develop communication strategies and increase her overall assertiveness Target Date: 2021-12-26 Frequency: Biweekly Progress: 0 Modality: individual Related Interventions 4. Therapist will provide [name] with emotion regulation strategies, including meditation, mindfulness, and self-care 5. Therapist will provide communication strategies to support [name]'s assertiveness, including talking about challenging situations to consider what [name] might say, opportunities for practice in session, and exercises  Diagnosis Axis none 296.32 (Major depressive affective disorder, recurrent episode, moderate) - Open - [Signifier: n/a]   Conditions For Discharge Achievement of treatment goals and objectives     Chrissie Noa, PhD     Chrissie Noa, PhD

## 2021-08-17 NOTE — Progress Notes (Signed)
Spoke with patient, gave a verbalized understanding.  °

## 2021-09-12 ENCOUNTER — Ambulatory Visit: Payer: 59 | Admitting: Family Medicine

## 2021-09-12 ENCOUNTER — Encounter: Payer: Self-pay | Admitting: Family Medicine

## 2021-09-12 VITALS — BP 122/74 | HR 75 | Temp 98.4°F | Ht 64.0 in | Wt 146.6 lb

## 2021-09-12 DIAGNOSIS — F5104 Psychophysiologic insomnia: Secondary | ICD-10-CM

## 2021-09-12 DIAGNOSIS — F339 Major depressive disorder, recurrent, unspecified: Secondary | ICD-10-CM

## 2021-09-12 MED ORDER — ZOLPIDEM TARTRATE 10 MG PO TABS: 10.0000 mg | ORAL_TABLET | Freq: Every evening | ORAL | 5 refills | Status: DC | PRN

## 2021-09-12 NOTE — Progress Notes (Signed)
? ?Subjective  ?CC:  ?Chief Complaint  ?Patient presents with  ? Mood  ?  Increased Effexor, pt says that this seems to be helping. She states that her sleep I a lot better with Ambien. She is requesting a refill today. She has no concerns today.   ? ? ?HPI: [name] is a 66 y.o. female who presents to the office today to address the problems listed above in the chief complaint, mood problems. ?See last note. On effexor 75 bid and wellbutrinxr 150 and feeling  a bit better. About 50% improved. Having more good days. Still with emotional lability and irritability. No Aes from higher dose of effexor.  ? ?Depression screen [facility] 2/9 07/18/2021 01/15/2021 10/11/2020  ?Decreased Interest 3 3 3   ?Down, Depressed, Hopeless 3 0 1  ?PHQ - 2 Score 6 3 4   ?Altered sleeping 3 3 3   ?Tired, decreased energy 3 3 3   ?Change in appetite 2 0 3  ?Feeling bad or failure about yourself  1 0 0  ?Trouble concentrating 3 3 3   ?Moving slowly or fidgety/restless 3 0 1  ?Suicidal thoughts 0 0 0  ?PHQ-9 Score 21 12 17   ?Difficult doing work/chores Very difficult Very difficult Very difficult  ? ? ? ?Assessment  ?1. Major depression, recurrent, chronic ([place])   ?2. Chronic insomnia   ? ?  ?Plan  ?Depression:  improving, slowly. Will continue current dose and recheck in 6-8 weeks. Can increase wellbutrin or effexor at that time if needed. Counseling done.  ?Insomnia is improved with ambien ?Reviewed concept of mood problems caused by biochemical imbalance of neurotransmitters and rationale for treatment with medications and therapy.  ?Counseling given: pt was instructed to contact office, on-call physician or crisis Hotline if symptoms worsen significantly. If patient develops any suicidal or homicidal thoughts, she is directed to the ER immediately.  ? ?Follow up: No follow-ups on file.  ?No orders of the defined types were placed in this encounter. ? ?Meds ordered this encounter  ?Medications  ? zolpidem (AMBIEN) 10 MG tablet  ?  Sig: Take 1  tablet (10 mg total) by mouth at bedtime as needed for sleep.  ?  Dispense:  30 tablet  ?  Refill:  5  ? ?  ? ?I reviewed the patients updated PMH, FH, and SocHx.  ?  ?Patient Active Problem List  ? Diagnosis Date Noted  ? Chronic insomnia 01/15/2021  ?  Priority: High  ? Hypertensive kidney disease with CKD stage III ([place]) 10/14/2020  ?  Priority: High  ? Arnold-Chiari malformation ([place]) 05/18/2019  ?  Priority: High  ? Chronic prescription benzodiazepine use 10/23/2018  ?  Priority: High  ? Chronic narcotic use 06/19/2018  ?  Priority: High  ? Migraine without aura and without status migrainosus, not intractable 06/19/2018  ?  Priority: High  ? Fibromyalgia 12/01/2012  ?  Priority: High  ? Major depression, recurrent, chronic ([place]) 05/21/2010  ?  Priority: High  ? Dyslipidemia 12/13/2008  ?  Priority: High  ? Chronic pain 12/02/2007  ?  Priority: High  ? Essential hypertension 04/28/2007  ?  Priority: High  ? Chronic migraine 05/18/2019  ?  Priority: Medium   ? Memory loss 05/18/2019  ?  Priority: Medium   ? Hypokalemia 06/19/2018  ?  Priority: Medium   ? Collagenous colitis 01/01/2008  ?  Priority: Medium   ? History of recurrent UTI (urinary tract infection) 04/28/2007  ?  Priority: Medium   ?  Nephrolithiasis 04/28/2007  ?  Priority: Medium   ? Vitamin B12 deficiency 03/27/2020  ?  Priority: Low  ? ?Current Meds  ?Medication Sig  ? aspirin 81 MG chewable tablet Chew by mouth daily.  ? atorvastatin (LIPITOR) 40 MG tablet TAKE 1 TABLET BY MOUTH  DAILY  ? baclofen (LIORESAL) 10 MG tablet Take 10 mg by mouth 3 (three) times daily.  ? buPROPion (WELLBUTRIN XL) 150 MG 24 hr tablet TAKE 1 TABLET BY MOUTH EVERY DAY  ? Cholecalciferol (VITAMIN D) 1000 UNITS capsule Take 1,000 Units by mouth daily.  ? eletriptan (RELPAX) 20 MG tablet Take 1 tablet (20 mg total) by mouth as needed for migraine or headache. May repeat in 2 hours if headache persists or recurs.  ? loperamide (IMODIUM) 2 MG capsule Take 1 capsule (2 mg total)  by mouth 4 (four) times daily as needed (collagenous colitis).  ? NUCYNTA 50 MG tablet Take 75 mg by mouth every 6 (six) hours as needed.   ? potassium chloride SA (KLOR-CON) 20 MEQ tablet TAKE 1 TABLET BY MOUTH  TWICE DAILY  ? promethazine (PHENERGAN) 25 MG tablet Take 1 tablet (25 mg total) by mouth every 6 (six) hours as needed. for nausea  ? venlafaxine (EFFEXOR) 75 MG tablet Take 1 tablet (75 mg total) by mouth 2 (two) times daily.  ? verapamil (CALAN-SR) 240 MG CR tablet TAKE 1 TABLET BY MOUTH AT  BEDTIME  ? vitamin B-12 (CYANOCOBALAMIN) 1000 MCG tablet Take 1,000 mcg by mouth 3 (three) times a week. 3 times a week  ? [DISCONTINUED] zolpidem (AMBIEN) 10 MG tablet Take 1 tablet (10 mg total) by mouth at bedtime as needed for sleep.  ? ? ?Allergies: ?Patient is allergic to codeine sulfate, moxifloxacin, penicillins, sulfonamide derivatives, biaxin [clarithromycin], diphenhydramine hcl, and tramadol hcl. ?Family history:  ?Patient family history includes Alcohol abuse in her father; Arrhythmia in her daughter; Healthy in her sister; Hyperlipidemia in her brother, brother, daughter, and mother; Hypertension in her brother, brother, and mother; Parkinson's disease in her mother; Polycystic ovary syndrome in her daughter; Suicidality in her father. ?Social History  ? ?Socioeconomic History  ? Marital status: Married  ?  Spouse name: disabled vet  ? Number of children: 2  ? Years of education: some college  ? Highest education level: Not on file  ?Occupational History  ? Occupation: Forensic psychologist  ?  Comment: [employer]  ?Tobacco Use  ? Smoking status: Former  ?  Packs/day: 1.00  ?  Types: Cigarettes  ?  Quit date: 2013  ?  Years since quitting: 10.1  ? Smokeless tobacco: Never  ?Vaping Use  ? Vaping Use: Not on file  ?Substance and Sexual Activity  ? Alcohol use: No  ? Drug use: No  ? Sexual activity: Yes  ?Other Topics Concern  ? Not on file  ?Social History Narrative  ? Lives at home with husband.  ? Right-handed.  ? No  daily caffeine use.  ? ?Social Determinants of Health  ? ?Financial Resource Strain: Not on file  ?Food Insecurity: Not on file  ?Transportation Needs: Not on file  ?Physical Activity: Not on file  ?Stress: Not on file  ?Social Connections: Not on file  ? ? ? ?Review of Systems: ?Constitutional: Negative for fever malaise or anorexia ?Cardiovascular: negative for chest pain ?Respiratory: negative for SOB or persistent cough ?Gastrointestinal: negative for abdominal pain ? ?Objective  ?Vitals: BP 122/74   Pulse 75   Temp 98.4 ?F (36.9 ?C) (Temporal)  Ht 5\' 4"  (1.626 m)   Wt 146 lb 9.6 oz (66.5 kg)   SpO2 99%   BMI 25.16 kg/m?  ?General: no acute distress, well appearing, no apparent distress, well groomed ?Psych flat affect ? ? ?Commons side effects, risks, benefits, and alternatives for medications and treatment plan prescribed today were discussed, and the patient expressed understanding of the given instructions. Patient is instructed to call or message via MyChart if he/she has any questions or concerns regarding our treatment plan. No barriers to understanding were identified. We discussed Red Flag symptoms and signs in detail. Patient expressed understanding regarding what to do in case of urgent or emergency type symptoms.  ?Medication list was reconciled, printed and provided to the patient in AVS. Patient instructions and summary information was reviewed with the patient as documented in the AVS. ?This note was prepared with assistance of Systems analyst. Occasional wrong-word or sound-a-like substitutions may have occurred due to the inherent limitations of voice recognition software ? ? ? ? ?

## 2021-09-28 ENCOUNTER — Ambulatory Visit: Payer: Managed Care, Other (non HMO) | Admitting: Clinical

## 2021-11-20 ENCOUNTER — Encounter: Payer: Self-pay | Admitting: Family Medicine

## 2021-11-20 ENCOUNTER — Ambulatory Visit (INDEPENDENT_AMBULATORY_CARE_PROVIDER_SITE_OTHER): Payer: 59 | Admitting: Family Medicine

## 2021-11-20 VITALS — BP 106/68 | HR 75 | Temp 98.3°F | Ht 64.0 in | Wt 144.0 lb

## 2021-11-20 DIAGNOSIS — Q0700 Arnold-Chiari syndrome without spina bifida or hydrocephalus: Secondary | ICD-10-CM

## 2021-11-20 DIAGNOSIS — I129 Hypertensive chronic kidney disease with stage 1 through stage 4 chronic kidney disease, or unspecified chronic kidney disease: Secondary | ICD-10-CM

## 2021-11-20 DIAGNOSIS — E538 Deficiency of other specified B group vitamins: Secondary | ICD-10-CM

## 2021-11-20 DIAGNOSIS — Z1212 Encounter for screening for malignant neoplasm of rectum: Secondary | ICD-10-CM

## 2021-11-20 DIAGNOSIS — E785 Hyperlipidemia, unspecified: Secondary | ICD-10-CM

## 2021-11-20 DIAGNOSIS — E876 Hypokalemia: Secondary | ICD-10-CM

## 2021-11-20 DIAGNOSIS — I10 Essential (primary) hypertension: Secondary | ICD-10-CM | POA: Diagnosis not present

## 2021-11-20 DIAGNOSIS — F5104 Psychophysiologic insomnia: Secondary | ICD-10-CM

## 2021-11-20 DIAGNOSIS — F339 Major depressive disorder, recurrent, unspecified: Secondary | ICD-10-CM

## 2021-11-20 DIAGNOSIS — M797 Fibromyalgia: Secondary | ICD-10-CM | POA: Diagnosis not present

## 2021-11-20 DIAGNOSIS — N1831 Chronic kidney disease, stage 3a: Secondary | ICD-10-CM

## 2021-11-20 DIAGNOSIS — Z0000 Encounter for general adult medical examination without abnormal findings: Secondary | ICD-10-CM

## 2021-11-20 DIAGNOSIS — Z1211 Encounter for screening for malignant neoplasm of colon: Secondary | ICD-10-CM

## 2021-11-20 LAB — CBC WITH DIFFERENTIAL/PLATELET
Basophils Absolute: 0.1 10*3/uL (ref 0.0–0.1)
Basophils Relative: 1.1 % (ref 0.0–3.0)
Eosinophils Absolute: 0.4 10*3/uL (ref 0.0–0.7)
Eosinophils Relative: 6.2 % — ABNORMAL HIGH (ref 0.0–5.0)
HCT: 37.2 % (ref 36.0–46.0)
Hemoglobin: 12.5 g/dL (ref 12.0–15.0)
Lymphocytes Relative: 35.9 % (ref 12.0–46.0)
Lymphs Abs: 2.1 10*3/uL (ref 0.7–4.0)
MCHC: 33.5 g/dL (ref 30.0–36.0)
MCV: 90.9 fl (ref 78.0–100.0)
Monocytes Absolute: 0.5 10*3/uL (ref 0.1–1.0)
Monocytes Relative: 9.2 % (ref 3.0–12.0)
Neutro Abs: 2.8 10*3/uL (ref 1.4–7.7)
Neutrophils Relative %: 47.6 % (ref 43.0–77.0)
Platelets: 244 10*3/uL (ref 150.0–400.0)
RBC: 4.1 Mil/uL (ref 3.87–5.11)
RDW: 13.5 % (ref 11.5–15.5)
WBC: 5.8 10*3/uL (ref 4.0–10.5)

## 2021-11-20 LAB — VITAMIN B12: Vitamin B-12: 858 pg/mL (ref 211–911)

## 2021-11-20 LAB — COMPREHENSIVE METABOLIC PANEL
ALT: 24 U/L (ref 0–35)
AST: 22 U/L (ref 0–37)
Albumin: 4 g/dL (ref 3.5–5.2)
Alkaline Phosphatase: 94 U/L (ref 39–117)
BUN: 16 mg/dL (ref 6–23)
CO2: 29 mEq/L (ref 19–32)
Calcium: 9.2 mg/dL (ref 8.4–10.5)
Chloride: 105 mEq/L (ref 96–112)
Creatinine, Ser: 1.2 mg/dL (ref 0.40–1.20)
GFR: 48.01 mL/min — ABNORMAL LOW (ref >60.00)
Glucose, Bld: 79 mg/dL (ref 70–99)
Potassium: 4.1 mEq/L (ref 3.5–5.1)
Sodium: 140 mEq/L (ref 135–145)
Total Bilirubin: 0.4 mg/dL (ref 0.2–1.2)
Total Protein: 7 g/dL (ref 6.0–8.3)

## 2021-11-20 LAB — LIPID PANEL
Cholesterol: 171 mg/dL (ref 0–200)
HDL: 75.7 mg/dL (ref >39.00)
LDL Cholesterol: 78 mg/dL (ref 0–99)
NonHDL: 95.08
Total CHOL/HDL Ratio: 2
Triglycerides: 83 mg/dL (ref 0.0–149.0)
VLDL: 16.6 mg/dL (ref 0.0–40.0)

## 2021-11-20 LAB — TSH: TSH: 1.5 u[IU]/mL (ref 0.35–5.50)

## 2021-11-20 MED ORDER — BUPROPION HCL ER (XL) 150 MG PO TB24: 150.0000 mg | ORAL_TABLET | Freq: Every day | ORAL | 3 refills | Status: DC

## 2021-11-20 NOTE — Patient Instructions (Addendum)
Please return in 6 months for hypertension follow up and mood recheck.  ? ?I will release your lab results to you on your MyChart account with further instructions. You may see the results before I do, but when I review them I will send you a message with my report or have my assistant call you if things need to be discussed. Please reply to my message with any questions. Thank you!  ? ?Your cologuard colon cancer screen will be due in October. I have ordered it for the future today, FYI. ? ?If you have any questions or concerns, please don't hesitate to send me a message via MyChart or call the office at [phone]. Thank you for visiting with [place] today! It's our pleasure caring for you.  ? ?Please do these things to maintain good health! ? ?Exercise at least 30-45 minutes a day,  4-5 days a week.  ?Eat a low-fat diet with lots of fruits and vegetables, up to 7-9 servings per day. ?Drink plenty of water daily. Try to drink 8 8oz glasses per day. ?Seatbelts can save your life. Always wear your seatbelt. ?Place Smoke Detectors on every level of your home and check batteries every year. ?Schedule an appointment with an eye doctor for an eye exam every 1-2 years ?Safe sex - use condoms to protect yourself from STDs if you could be exposed to these types of infections. Use birth control if you do not want to become pregnant and are sexually active. ?Avoid heavy alcohol use. If you drink, keep it to less than 2 drinks/day and not every day. ?[place].  Choose someone you trust that could speak for you if you became unable to speak for yourself. ?Depression is common in our stressful world.If you're feeling down or losing interest in things you normally enjoy, please come in for a visit. ?If anyone is threatening or hurting you, please get help. Physical or Emotional Violence is never OK.   ?

## 2021-11-20 NOTE — Progress Notes (Signed)
? ?Subjective  ?Chief Complaint  ?Patient presents with  ? Annual Exam  ?  Pt here for Annual Exam and is currently fasting  ? ? ?HPI: [name] is a 66 y.o. female who presents to [place] at [place] today for a Female Wellness Visit. She also has the concerns and/or needs as listed above in the chief complaint. These will be addressed in addition to the Health Maintenance Visit.  ? ?Wellness Visit: annual visit with health maintenance review and exam without Pap ? ?Health maintenance: Screening.  Colorectal cancer screening with Cologuard will be due again in October of this year.  Mammogram is due in July.  Immunizations are up-to-date.  Overall she is stable reports she is doing well. ?Chronic disease f/u and/or acute problem visit: (deemed necessary to be done in addition to the wellness visit): ?Mood: We adjusted Her medications back earlier this year and she is stabilized.  On Effexor 75 mg daily and Wellbutrin XR 150 mg daily. ?Hypertension: Blood pressure is borderline low and she is asymptomatic.  Takes diltiazem 240 mg daily.  No chest pain, palpitations, diaphoresis, orthostatic symptoms. ?Dyslipidemia on atorvastatin 40 nightly.  Tolerates well.  Fasting for lab recheck today. ?Fibromyalgia and chronic pain managed by pain management on chronic narcotics.  Currently undergoing physical therapy for back pain that is active. 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 ? ?Assessment  ?1. Annual physical exam   ?2. Major depression, recurrent, chronic ([place])   ?3. Essential hypertension   ?4. Dyslipidemia   ?5. Fibromyalgia   ?6. Arnold-Chiari malformation ([place])   ?7. Hypertensive kidney disease with stage 3a chronic kidney disease ([place])   ?8. Chronic insomnia   ?9. Hypokalemia   ?10. Vitamin B12 deficiency   ?11. Screening for colorectal cancer   ? ?  ?Plan  ?Female Wellness Visit: ?Age appropriate Health  Maintenance and Prevention measures were discussed with patient. Included topics are cancer screening recommendations, ways to keep healthy (see AVS) including dietary and exercise recommendations, regular eye and dental care, use of seat belts, and avoidance of moderate alcohol use and tobacco use.  Cologuard due in October. ?BMI: discussed patient's BMI and encouraged positive lifestyle modifications to help get to or maintain a target BMI. ?HM needs and immunizations were addressed and ordered. See below for orders. See HM and immunization section for updates.  Up-to-date ?Routine labs and screening tests ordered including cmp, cbc and lipids where appropriate. ?Discussed recommendations regarding Vit D and calcium supplementation (see AVS) ? ?Chronic disease management visit and/or acute problem visit: ?Major depression chronic is fairly well controlled.  Continue Wellbutrin and Effexor as noted above. ?Blood pressure very well controlled.  She has a home blood pressure cuff and will monitor if she has any symptoms of lightheadedness.  Continue diltiazem 240 daily.  Recheck renal function electrolytes. ?Chronic kidney disease due to hypertensive disease.  Asymptomatic.  Monitoring with lab work. ?Chronic insomnia no longer on Ambien due to side effects ?Recheck B12 levels and her lipid levels on statin.  Lipitor 40 nightly.  Check liver test. ?Chronic pain for chronic pain management ? ?Follow up: 6 months to recheck blood pressure and mood ?Orders Placed This Encounter  ?Procedures  ? CBC with Differential/Platelet  ? Comprehensive metabolic panel  ? Lipid panel  ? TSH  ? Vitamin B12  ? Cologuard  ? ?Meds ordered this encounter  ?Medications  ? buPROPion (  WELLBUTRIN XL) 150 MG 24 hr tablet  ?  Sig: Take 1 tablet (150 mg total) by mouth daily.  ?  Dispense:  90 tablet  ?  Refill:  3  ? ?  ? ?Body mass index is 24.72 kg/m?. ?Wt Readings from Last 3 Encounters:  ?11/20/21 144 lb (65.3 kg)  ?09/12/21 146 lb 9.6 oz  (66.5 kg)  ?07/18/21 151 lb (68.5 kg)  ? ? ? ?Patient Active Problem List  ? Diagnosis Date Noted  ? Chronic insomnia 01/15/2021  ?  Priority: High  ? Hypertensive kidney disease with CKD stage III ([place]) 10/14/2020  ?  Priority: High  ?  GFR 48 09/2020 ? ?  ? Arnold-Chiari malformation ([place]) 05/18/2019  ?  Priority: High  ? Chronic prescription benzodiazepine use 10/23/2018  ?  Priority: High  ? Chronic narcotic use 06/19/2018  ?  Priority: High  ? Migraine without aura and without status migrainosus, not intractable 06/19/2018  ?  Priority: High  ? Fibromyalgia 12/01/2012  ?  Priority: High  ?  Has failed gabapentin, lyrica, tramadol. Has seen Dr. Estanislado Pandy in the past.  ? ?  ? Major depression, recurrent, chronic ([place]) 05/21/2010  ?  Priority: High  ? Dyslipidemia 12/13/2008  ?  Priority: High  ? Chronic pain 12/02/2007  ?  Priority: High  ?  Dr. Melton Alar for chronic pain ?Head, neck, and back ?  ? Essential hypertension 04/28/2007  ?  Priority: High  ? Chronic migraine 05/18/2019  ?  Priority: Medium   ? Memory loss 05/18/2019  ?  Priority: Medium   ?  Evaluated by neurology, Dr. Krista Blue, Mini-Mental Status exam 29 of 30, name 15 animals and animal naming test.  Normal neurologic exam.  No cognitive changes noted.  Polypharmacy and depression as likely causes. ? ?  ? Hypokalemia 06/19/2018  ?  Priority: Medium   ?  ? GI loss; hasn't had work up. On supplement ? ?  ? Collagenous colitis 01/01/2008  ?  Priority: Medium   ?  Dr. Carlean Purl ? ?  ? History of recurrent UTI (urinary tract infection) 04/28/2007  ?  Priority: Medium   ? Nephrolithiasis 04/28/2007  ?  Priority: Medium   ? Vitamin B12 deficiency 03/27/2020  ?  Priority: Low  ? ?Health Maintenance  ?Topic Date Due  ? COVID-19 Vaccine (4 - Booster for Moderna series) 12/06/2021 (Originally 08/18/2020)  ? MAMMOGRAM  01/23/2022  ? INFLUENZA VACCINE  02/05/2022  ? Fecal DNA (Cologuard)  04/26/2022  ? TETANUS/TDAP  09/19/2029  ? Hepatitis C Screening  Completed  ? HIV  Screening  Completed  ? Zoster Vaccines- Shingrix  Completed  ? HPV VACCINES  Aged Out  ? COLONOSCOPY (Pts 45-68yrs Insurance coverage will need to be confirmed)  Discontinued  ? ?Immunization History  ?Administered Date(s) Administered  ? Influenza Split 05/27/2012  ? Influenza Whole 03/15/2010  ? Influenza, High Dose Seasonal PF 04/29/2017  ? Influenza,inj,Quad PF,6+ Mos 06/02/2013, 05/24/2014, 05/09/2015, 05/28/2016, 06/19/2018, 03/31/2019, 07/18/2021  ? Moderna Sars-Covid-2 Vaccination 09/30/2019, 10/26/2019, 06/23/2020  ? PNEUMOCOCCAL CONJUGATE-20 07/18/2021  ? Td 12/13/2008  ? Tdap 09/20/2019  ? Zoster Recombinat (Shingrix) 03/27/2020, 10/11/2020  ? ?We updated and reviewed the patient's past history in detail and it is documented below. ?Allergies: ?Patient is allergic to codeine sulfate, moxifloxacin, penicillins, sulfonamide derivatives, biaxin [clarithromycin], diphenhydramine hcl, and tramadol hcl. ?Past Medical History ?Patient  has a past medical history of Anxiety, Bladder stones, Bursitis of shoulder, Chiari malformation type I ([place]),  Chronic prescription benzodiazepine use (10/23/2018), COLLAGENOUS COLITIS (01/01/2008), DEPRESSIVE DISORDER (05/21/2010), Fibromyalgia, History of kidney stones, HYPERLIPIDEMIA (12/13/2008), HYPERTENSION (04/28/2007), LOW BACK PAIN (12/02/2007), Migraine, Migraine without aura and without status migrainosus, not intractable (06/19/2018), NEPHROLITHIASIS, HX OF (04/28/2007), PONV (postoperative nausea and vomiting), Raynaud disease, Renal colic ([id number]), and UTI'S, RECURRENT (04/28/2007). ?Past Surgical History ?Patient  has a past surgical history that includes Abdominal hysterectomy (1990); colonoscopy; Wisdom tooth extraction; Suboccipital craniectomy cervical laminectomy (N/A, 12/11/2018); Ethmoidectomy (Right, 03/01/2019); Frontal sinus exploration (Right, 03/01/2019); Sinus endo with fusion (Right, 03/01/2019); MRI (2021); and radiotherapy ablation (N/A). ?Family  History: ?Patient family history includes Alcohol abuse in her father; Arrhythmia in her daughter; Healthy in her sister; Hyperlipidemia in her brother, brother, daughter, and mother; Hypertension in her brother, brother,

## 2022-01-19 ENCOUNTER — Other Ambulatory Visit: Payer: Self-pay | Admitting: Family Medicine

## 2022-01-31 ENCOUNTER — Other Ambulatory Visit: Payer: Self-pay | Admitting: Family Medicine

## 2022-01-31 DIAGNOSIS — E876 Hypokalemia: Secondary | ICD-10-CM

## 2022-04-01 ENCOUNTER — Encounter: Payer: Self-pay | Admitting: *Deleted

## 2022-05-28 ENCOUNTER — Ambulatory Visit: Payer: 59 | Admitting: Family Medicine

## 2022-05-28 ENCOUNTER — Encounter: Payer: Self-pay | Admitting: Family Medicine

## 2022-05-28 VITALS — BP 120/72 | HR 82 | Temp 98.3°F | Ht 64.0 in | Wt 157.6 lb

## 2022-05-28 DIAGNOSIS — I10 Essential (primary) hypertension: Secondary | ICD-10-CM | POA: Diagnosis not present

## 2022-05-28 DIAGNOSIS — I129 Hypertensive chronic kidney disease with stage 1 through stage 4 chronic kidney disease, or unspecified chronic kidney disease: Secondary | ICD-10-CM

## 2022-05-28 DIAGNOSIS — Z23 Encounter for immunization: Secondary | ICD-10-CM

## 2022-05-28 DIAGNOSIS — Z1211 Encounter for screening for malignant neoplasm of colon: Secondary | ICD-10-CM | POA: Diagnosis not present

## 2022-05-28 DIAGNOSIS — Z1212 Encounter for screening for malignant neoplasm of rectum: Secondary | ICD-10-CM

## 2022-05-28 DIAGNOSIS — F339 Major depressive disorder, recurrent, unspecified: Secondary | ICD-10-CM | POA: Diagnosis not present

## 2022-05-28 DIAGNOSIS — E876 Hypokalemia: Secondary | ICD-10-CM

## 2022-05-28 DIAGNOSIS — N1831 Chronic kidney disease, stage 3a: Secondary | ICD-10-CM | POA: Diagnosis not present

## 2022-05-28 DIAGNOSIS — Z1231 Encounter for screening mammogram for malignant neoplasm of breast: Secondary | ICD-10-CM

## 2022-05-28 LAB — VITAMIN B12: Vitamin B-12: 501 pg/mL (ref 211–911)

## 2022-05-28 LAB — COMPREHENSIVE METABOLIC PANEL
ALT: 15 U/L (ref 0–35)
AST: 13 U/L (ref 0–37)
Albumin: 4.1 g/dL (ref 3.5–5.2)
Alkaline Phosphatase: 91 U/L (ref 39–117)
BUN: 19 mg/dL (ref 6–23)
CO2: 26 mEq/L (ref 19–32)
Calcium: 8.8 mg/dL (ref 8.4–10.5)
Chloride: 106 mEq/L (ref 96–112)
Creatinine, Ser: 1.18 mg/dL (ref 0.40–1.20)
GFR: 48.81 mL/min — ABNORMAL LOW (ref >60.00)
Glucose, Bld: 93 mg/dL (ref 70–99)
Potassium: 3.8 mEq/L (ref 3.5–5.1)
Sodium: 142 mEq/L (ref 135–145)
Total Bilirubin: 0.4 mg/dL (ref 0.2–1.2)
Total Protein: 7.1 g/dL (ref 6.0–8.3)

## 2022-05-28 LAB — MAGNESIUM: Magnesium: 1.8 mg/dL (ref 1.5–2.5)

## 2022-05-28 NOTE — Progress Notes (Signed)
Subjective  CC:  Chief Complaint  Patient presents with   Hypertension   Depression    HPI: [name] is a 66 y.o. female who presents to the office today to address the problems listed above in the chief complaint. Hypertension f/u: Control is good . Pt reports she is doing well. taking medications as instructed, no medication side effects noted, no TIAs, no chest pain on exertion, no dyspnea on exertion, she does have mild end of daying swelling of ankles. She denies adverse effects from his BP medications. Compliance with medication is good.  Hypokalemia; chronic. On supplements H/o mild ckd; no new sxs Depression remains; reports stable on wellbutrin and sertraline.  HM: due cologuard (ordered but she never received it) and due mammogram. Flu shot today  Assessment  1. Essential hypertension   2. Major depression, recurrent, chronic (HCC)   3. Hypertensive kidney disease with stage 3a chronic kidney disease (HCC)   4. Screening for colorectal cancer   5. Encounter for screening mammogram for breast cancer   6. Hypokalemia      Plan   Hypertension f/u: BP control is well controlled. Continue verapamil Depression stable Recheck renal, lytes and b12 Pt to schedule mammogram. Cologuard reordered and education given. Flu shot today.   Education regarding management of these chronic disease states was given. Management strategies discussed on successive visits include dietary and exercise recommendations, goals of achieving and maintaining IBW, and lifestyle modifications aiming for adequate sleep and minimizing stressors.   Follow up: 6 mo for cpe  Orders Placed This Encounter  Procedures   Comprehensive metabolic panel   Magnesium   Vitamin B12   No orders of the defined types were placed in this encounter.     BP Readings from Last 3 Encounters:  05/28/22 120/72  11/20/21 106/68  09/12/21 122/74   Wt Readings from Last 3 Encounters:  05/28/22 157 lb 9.6 oz  (71.5 kg)  11/20/21 144 lb (65.3 kg)  09/12/21 146 lb 9.6 oz (66.5 kg)    Lab Results  Component Value Date   CHOL 171 11/20/2021   CHOL 180 10/11/2020   CHOL 189 09/20/2019   Lab Results  Component Value Date   HDL 75.70 11/20/2021   HDL 82.60 10/11/2020   HDL 83.90 09/20/2019   Lab Results  Component Value Date   LDLCALC 78 11/20/2021   LDLCALC 83 10/11/2020   LDLCALC 90 09/20/2019   Lab Results  Component Value Date   TRIG 83.0 11/20/2021   TRIG 71.0 10/11/2020   TRIG 72.0 09/20/2019   Lab Results  Component Value Date   CHOLHDL 2 11/20/2021   CHOLHDL 2 10/11/2020   CHOLHDL 2 09/20/2019   Lab Results  Component Value Date   LDLDIRECT 98.0 11/21/2015   LDLDIRECT 158.7 11/22/2011   LDLDIRECT 214.9 12/06/2008   Lab Results  Component Value Date   CREATININE 1.20 11/20/2021   BUN 16 11/20/2021   NA 140 11/20/2021   K 4.1 11/20/2021   CL 105 11/20/2021   CO2 29 11/20/2021    The 10-year ASCVD risk score (Arnett DK, et al., 2019) is: 4.7%   Values used to calculate the score:     Age: 4 years     Sex: Female     Is Non-Hispanic African American: No     Diabetic: No     Tobacco smoker: No     Systolic Blood Pressure: 120 mmHg     Is BP treated: Yes  HDL Cholesterol: 75.7 mg/dL     Total Cholesterol: 171 mg/dL  I reviewed the patients updated PMH, FH, and SocHx.    Patient Active Problem List   Diagnosis Date Noted   Chronic insomnia 01/15/2021    Priority: High   Hypertensive kidney disease with CKD stage III (HCC) 10/14/2020    Priority: High   Arnold-Chiari malformation (HCC) 05/18/2019    Priority: High   Chronic prescription benzodiazepine use 10/23/2018    Priority: High   Chronic narcotic use 06/19/2018    Priority: High   Migraine without aura and without status migrainosus, not intractable 06/19/2018    Priority: High   Fibromyalgia 12/01/2012    Priority: High   Major depression, recurrent, chronic (HCC) 05/21/2010     Priority: High   Dyslipidemia 12/13/2008    Priority: High   Chronic pain 12/02/2007    Priority: High   Essential hypertension 04/28/2007    Priority: High   Chronic migraine 05/18/2019    Priority: Medium    Memory loss 05/18/2019    Priority: Medium    Hypokalemia 06/19/2018    Priority: Medium    Collagenous colitis 01/01/2008    Priority: Medium    History of recurrent UTI (urinary tract infection) 04/28/2007    Priority: Medium    Nephrolithiasis 04/28/2007    Priority: Medium    Vitamin B12 deficiency 03/27/2020    Priority: Low    Allergies: Codeine sulfate, Moxifloxacin, Penicillins, Sulfonamide derivatives, Biaxin [clarithromycin], Diphenhydramine hcl, and Tramadol hcl  Social History: Patient  reports that she quit smoking about 10 years ago. Her smoking use included cigarettes. She smoked an average of 1 pack per day. She has never used smokeless tobacco. She reports that she does not drink alcohol and does not use drugs.  Current Meds  Medication Sig   aspirin 81 MG chewable tablet Chew by mouth daily.   atorvastatin (LIPITOR) 40 MG tablet TAKE 1 TABLET BY MOUTH  DAILY   baclofen (LIORESAL) 10 MG tablet Take 10 mg by mouth 3 (three) times daily.   buPROPion (WELLBUTRIN XL) 150 MG 24 hr tablet Take 1 tablet (150 mg total) by mouth daily.   Cholecalciferol (VITAMIN D) 1000 UNITS capsule Take 1,000 Units by mouth daily.   eletriptan (RELPAX) 20 MG tablet Take 1 tablet (20 mg total) by mouth as needed for migraine or headache. May repeat in 2 hours if headache persists or recurs.   loperamide (IMODIUM) 2 MG capsule Take 1 capsule (2 mg total) by mouth 4 (four) times daily as needed (collagenous colitis).   NUCYNTA 50 MG tablet Take 75 mg by mouth every 6 (six) hours as needed.    potassium chloride SA (KLOR-CON M) 20 MEQ tablet TAKE 1 TABLET BY MOUTH  TWICE DAILY   promethazine (PHENERGAN) 25 MG tablet Take 1 tablet (25 mg total) by mouth every 6 (six) hours as needed.  for nausea   venlafaxine (EFFEXOR) 75 MG tablet Take 1 tablet (75 mg total) by mouth 2 (two) times daily.   verapamil (CALAN-SR) 240 MG CR tablet TAKE 1 TABLET BY MOUTH AT  BEDTIME   vitamin B-12 (CYANOCOBALAMIN) 1000 MCG tablet Take 1,000 mcg by mouth 3 (three) times a week. 3 times a week    Review of Systems: Cardiovascular: negative for chest pain, palpitations, leg swelling, orthopnea Respiratory: negative for SOB, wheezing or persistent cough Gastrointestinal: negative for abdominal pain Genitourinary: negative for dysuria or gross hematuria  Objective  Vitals: BP 120/72  Pulse 82   Temp 98.3 F (36.8 C)   Ht 5\' 4"  (1.626 m)   Wt 157 lb 9.6 oz (71.5 kg)   SpO2 98%   BMI 27.05 kg/m  General: no acute distress  Psych:  Alert and oriented, normal mood and affect HEENT:  Normocephalic, atraumatic, supple neck  Cardiovascular:  RRR without murmur. no edema Respiratory:  Good breath sounds bilaterally, CTAB with normal respiratory effort Skin:  Warm, no rashes Neurologic:   Mental status is normal, tremor present Commons side effects, risks, benefits, and alternatives for medications and treatment plan prescribed today were discussed, and the patient expressed understanding of the given instructions. Patient is instructed to call or message via MyChart if he/she has any questions or concerns regarding our treatment plan. No barriers to understanding were identified. We discussed Red Flag symptoms and signs in detail. Patient expressed understanding regarding what to do in case of urgent or emergency type symptoms.  Medication list was reconciled, printed and provided to the patient in AVS. Patient instructions and summary information was reviewed with the patient as documented in the AVS. This note was prepared with assistance of Dragon voice recognition software. Occasional wrong-word or sound-a-like substitutions may have occurred due to the inherent limitation

## 2022-05-28 NOTE — Patient Instructions (Signed)
Please return in 6 months for your annual complete physical; please come fasting.   I will release your lab results to you on your MyChart account with further instructions. You may see the results before I do, but when I review them I will send you a message with my report or have my assistant call you if things need to be discussed. Please reply to my message with any questions. Thank you!   Please get your cologuard done; let me know if you do not receive it!!  If you have any questions or concerns, please don't hesitate to send me a message via MyChart or call the office at [phone]. Thank you for visiting with [place] today! It's our pleasure caring for you.   I have ordered a mammogram and/or bone density for you as we discussed today: [x]   Mammogram  []   Bone Density  Please call the office checked below to schedule your appointment:  [x]   [facility]      [address] [place],        [address] Wing         []   [facility]  [address] [place],  [facility]

## 2022-06-18 LAB — COLOGUARD: COLOGUARD: NEGATIVE

## 2022-07-13 ENCOUNTER — Other Ambulatory Visit: Payer: Self-pay | Admitting: Family Medicine

## 2022-10-17 ENCOUNTER — Encounter: Payer: Self-pay | Admitting: Family Medicine

## 2022-10-18 ENCOUNTER — Other Ambulatory Visit: Payer: Self-pay

## 2022-10-18 DIAGNOSIS — R110 Nausea: Secondary | ICD-10-CM

## 2022-10-23 ENCOUNTER — Other Ambulatory Visit: Payer: Self-pay | Admitting: Family Medicine

## 2022-10-23 DIAGNOSIS — Z1231 Encounter for screening mammogram for malignant neoplasm of breast: Secondary | ICD-10-CM

## 2022-11-26 ENCOUNTER — Other Ambulatory Visit (HOSPITAL_BASED_OUTPATIENT_CLINIC_OR_DEPARTMENT_OTHER): Payer: Self-pay | Admitting: Nurse Practitioner

## 2022-11-26 ENCOUNTER — Ambulatory Visit (INDEPENDENT_AMBULATORY_CARE_PROVIDER_SITE_OTHER): Payer: 59 | Admitting: Family Medicine

## 2022-11-26 ENCOUNTER — Encounter: Payer: Self-pay | Admitting: Family Medicine

## 2022-11-26 ENCOUNTER — Ambulatory Visit (HOSPITAL_BASED_OUTPATIENT_CLINIC_OR_DEPARTMENT_OTHER)
Admission: RE | Admit: 2022-11-26 | Discharge: 2022-11-26 | Disposition: A | Discharge status: Home / Self Care | Payer: 59 | Source: Ambulatory Visit | Attending: Nurse Practitioner | Admitting: Nurse Practitioner

## 2022-11-26 VITALS — BP 110/60 | HR 73 | Temp 98.6°F | Ht 64.0 in | Wt 151.0 lb

## 2022-11-26 DIAGNOSIS — E538 Deficiency of other specified B group vitamins: Secondary | ICD-10-CM | POA: Diagnosis not present

## 2022-11-26 DIAGNOSIS — G894 Chronic pain syndrome: Secondary | ICD-10-CM | POA: Diagnosis not present

## 2022-11-26 DIAGNOSIS — I129 Hypertensive chronic kidney disease with stage 1 through stage 4 chronic kidney disease, or unspecified chronic kidney disease: Secondary | ICD-10-CM

## 2022-11-26 DIAGNOSIS — Z780 Asymptomatic menopausal state: Secondary | ICD-10-CM

## 2022-11-26 DIAGNOSIS — E785 Hyperlipidemia, unspecified: Secondary | ICD-10-CM

## 2022-11-26 DIAGNOSIS — F1190 Opioid use, unspecified, uncomplicated: Secondary | ICD-10-CM

## 2022-11-26 DIAGNOSIS — Z0000 Encounter for general adult medical examination without abnormal findings: Secondary | ICD-10-CM

## 2022-11-26 DIAGNOSIS — M533 Sacrococcygeal disorders, not elsewhere classified: Secondary | ICD-10-CM | POA: Diagnosis not present

## 2022-11-26 DIAGNOSIS — N1831 Chronic kidney disease, stage 3a: Secondary | ICD-10-CM

## 2022-11-26 DIAGNOSIS — F339 Major depressive disorder, recurrent, unspecified: Secondary | ICD-10-CM

## 2022-11-26 DIAGNOSIS — Q0700 Arnold-Chiari syndrome without spina bifida or hydrocephalus: Secondary | ICD-10-CM

## 2022-11-26 DIAGNOSIS — I10 Essential (primary) hypertension: Secondary | ICD-10-CM

## 2022-11-26 DIAGNOSIS — F5104 Psychophysiologic insomnia: Secondary | ICD-10-CM

## 2022-11-26 DIAGNOSIS — M797 Fibromyalgia: Secondary | ICD-10-CM

## 2022-11-26 LAB — CBC WITH DIFFERENTIAL/PLATELET
Basophils Absolute: 0 10*3/uL (ref 0.0–0.1)
Basophils Relative: 0.5 % (ref 0.0–3.0)
Eosinophils Absolute: 0.5 10*3/uL (ref 0.0–0.7)
Eosinophils Relative: 6.9 % — ABNORMAL HIGH (ref 0.0–5.0)
HCT: 35 % — ABNORMAL LOW (ref 36.0–46.0)
Hemoglobin: 11.5 g/dL — ABNORMAL LOW (ref 12.0–15.0)
Lymphocytes Relative: 32.4 % (ref 12.0–46.0)
Lymphs Abs: 2.1 10*3/uL (ref 0.7–4.0)
MCHC: 32.9 g/dL (ref 30.0–36.0)
MCV: 90.1 fl (ref 78.0–100.0)
Monocytes Absolute: 0.4 10*3/uL (ref 0.1–1.0)
Monocytes Relative: 5.5 % (ref 3.0–12.0)
Neutro Abs: 3.6 10*3/uL (ref 1.4–7.7)
Neutrophils Relative %: 54.7 % (ref 43.0–77.0)
Platelets: 271 10*3/uL (ref 150.0–400.0)
RBC: 3.89 Mil/uL (ref 3.87–5.11)
RDW: 13.9 % (ref 11.5–15.5)
WBC: 6.6 10*3/uL (ref 4.0–10.5)

## 2022-11-26 LAB — COMPREHENSIVE METABOLIC PANEL
ALT: 12 U/L (ref 0–35)
AST: 15 U/L (ref 0–37)
Albumin: 3.8 g/dL (ref 3.5–5.2)
Alkaline Phosphatase: 91 U/L (ref 39–117)
BUN: 13 mg/dL (ref 6–23)
CO2: 29 mEq/L (ref 19–32)
Calcium: 9 mg/dL (ref 8.4–10.5)
Chloride: 105 mEq/L (ref 96–112)
Creatinine, Ser: 1.12 mg/dL (ref 0.40–1.20)
GFR: 51.79 mL/min — ABNORMAL LOW (ref >60.00)
Glucose, Bld: 91 mg/dL (ref 70–99)
Potassium: 3.9 mEq/L (ref 3.5–5.1)
Sodium: 141 mEq/L (ref 135–145)
Total Bilirubin: 0.5 mg/dL (ref 0.2–1.2)
Total Protein: 6.5 g/dL (ref 6.0–8.3)

## 2022-11-26 LAB — LIPID PANEL
Cholesterol: 155 mg/dL (ref 0–200)
HDL: 60.6 mg/dL (ref >39.00)
LDL Cholesterol: 74 mg/dL (ref 0–99)
NonHDL: 94.67
Total CHOL/HDL Ratio: 3
Triglycerides: 103 mg/dL (ref 0.0–149.0)
VLDL: 20.6 mg/dL (ref 0.0–40.0)

## 2022-11-26 LAB — TSH: TSH: 1.04 u[IU]/mL (ref 0.35–5.50)

## 2022-11-26 LAB — VITAMIN B12: Vitamin B-12: 653 pg/mL (ref 211–911)

## 2022-11-26 NOTE — Patient Instructions (Signed)
Please return in 12 months for your annual complete physical; please come fasting.   If you have any questions or concerns, please don't hesitate to send me a message via MyChart or call the office at [phone]. Thank you for visiting with [place] today! It's our pleasure caring for you.   Please call the office checked below to schedule your appointment for your mammogram and/or bone density screen (the checked studies were ordered): []   Mammogram  [x]   Bone Density  [x]   [facility]     [address] [place], [place]        [phone]         []   [facility]  [address] [place], [place]  [phone]

## 2022-11-26 NOTE — Progress Notes (Signed)
Subjective  Chief Complaint  Patient presents with   Annual Exam    Pt here for Annual Exam and is currently fasting. Dexa has not been done as of yet    HPI: [name] is a 67 y.o. female who presents to [facility] [place] today for a Female Wellness Visit. She also has the concerns and/or needs as listed above in the chief complaint. These will be addressed in addition to the Health Maintenance Visit.    Wellness Visit: annual visit with health maintenance review and exam without Pap  Health maintenance: Mammogram scheduled for this week.  Due for bone density, for screening.  Retired 2 weeks ago.  Feels good about this.  Overall reports she is stable.  Immunizations are up-to-date.  Colorectal cancer screening, negative Cologuard last year. Chronic disease f/u and/or acute problem visit: (deemed necessary to be done in addition to the wellness visit): Chronic medical problems listed below reviewed.  Pain management is stable.  Working with rheumatology for some hip pain.  Blood pressure is well-controlled on calcium channel blocker.  No lower extremity edema has history of mild kidney disease due for recheck.  No nausea or vomiting.  Mood is well-controlled on current medications.  B12 deficiency with oral supplementation due for recheck.  She is fasting for lipid recheck.  Assessment  1. Annual physical exam   2. Asymptomatic menopausal state   3. Arnold-Chiari malformation (HCC)   4. Chronic insomnia   5. Chronic narcotic use   6. Chronic pain syndrome   7. Dyslipidemia   8. Essential hypertension   9. Fibromyalgia   10. Hypertensive kidney disease with stage 3a chronic kidney disease (HCC)   11. Major depression, recurrent, chronic (HCC)   12. Vitamin B12 deficiency      Plan  Female Wellness Visit: Age appropriate Health Maintenance and Prevention measures were discussed with patient. Included topics are cancer screening recommendations, ways to keep  healthy (see AVS) including dietary and exercise recommendations, regular eye and dental care, use of seat belts, and avoidance of moderate alcohol use and tobacco use.  Mammogram this week.  Bone density screening ordered BMI: discussed patient's BMI and encouraged positive lifestyle modifications to help get to or maintain a target BMI. HM needs and immunizations were addressed and ordered. See below for orders. See HM and immunization section for updates. Routine labs and screening tests ordered including cmp, cbc and lipids where appropriate. Discussed recommendations regarding Vit D and calcium supplementation (see AVS)  Chronic disease management visit and/or acute problem visit: Hypertension well-controlled on verapamil 240 mg daily.  Recheck renal function and electrolytes Hypertensive chronic kidney disease for recheck today.  Asymptomatic. Chronic pain per pain management.  Migraines are well-controlled Chronic depression and anxiety are controlled on bupropion 150 daily and effects or 75 mg twice a day. Hyperlipidemia on 40 mg nightly due for recheck fasting today with lipids.  Has been well-controlled.  Outpatient Encounter Medications as of 11/26/2022  Medication Sig   aspirin 81 MG chewable tablet Chew by mouth daily.   atorvastatin (LIPITOR) 40 MG tablet TAKE 1 TABLET BY MOUTH  DAILY   buPROPion (WELLBUTRIN XL) 150 MG 24 hr tablet Take 1 tablet (150 mg total) by mouth daily.   Cholecalciferol (VITAMIN D) 1000 UNITS capsule Take 1,000 Units by mouth daily.   cyclobenzaprine (FLEXERIL) 10 MG tablet Take 10 mg by mouth 3 (three) times daily.   eletriptan (RELPAX) 20 MG tablet Take 1  tablet (20 mg total) by mouth as needed for migraine or headache. May repeat in 2 hours if headache persists or recurs.   loperamide (IMODIUM) 2 MG capsule Take 1 capsule (2 mg total) by mouth 4 (four) times daily as needed (collagenous colitis).   oxyCODONE-acetaminophen (PERCOCET) 10-325 MG tablet Take  1 tablet by mouth 4 (four) times daily as needed for pain.   potassium chloride SA (KLOR-CON M) 20 MEQ tablet TAKE 1 TABLET BY MOUTH  TWICE DAILY   venlafaxine (EFFEXOR) 75 MG tablet TAKE 1 TABLET BY MOUTH TWICE A DAY   verapamil (CALAN-SR) 240 MG CR tablet TAKE 1 TABLET BY MOUTH AT  BEDTIME   vitamin B-12 (CYANOCOBALAMIN) 1000 MCG tablet Take 1,000 mcg by mouth 3 (three) times a week. 3 times a week   No facility-administered encounter medications on file as of 11/26/2022.    Follow up: 12 months for complete physical Orders Placed This Encounter  Procedures   DG Bone Density   CBC with Differential/Platelet   Comprehensive metabolic panel   Lipid panel   TSH   Vitamin B12   No orders of the defined types were placed in this encounter.     Body mass index is 25.92 kg/m. Wt Readings from Last 3 Encounters:  11/26/22 151 lb (68.5 kg)  05/28/22 157 lb 9.6 oz (71.5 kg)  11/20/21 144 lb (65.3 kg)     Patient Active Problem List   Diagnosis Date Noted   Chronic insomnia 01/15/2021    Priority: High   Hypertensive kidney disease with CKD stage III (HCC) 10/14/2020    Priority: High    GFR 48 09/2020    Arnold-Chiari malformation (HCC) 05/18/2019    Priority: High   Chronic prescription benzodiazepine use 10/23/2018    Priority: High   Chronic narcotic use 06/19/2018    Priority: High   Migraine without aura and without status migrainosus, not intractable 06/19/2018    Priority: High   Fibromyalgia 12/01/2012    Priority: High    Has failed gabapentin, lyrica, tramadol. Has seen Dr. Corliss Skains in the past.     Major depression, recurrent, chronic (HCC) 05/21/2010    Priority: High   Dyslipidemia 12/13/2008    Priority: High   Chronic pain 12/02/2007    Priority: High    Dr. Vela Prose for chronic pain Head, neck, and back    Essential hypertension 04/28/2007    Priority: High   Chronic migraine 05/18/2019    Priority: Medium    Memory loss 05/18/2019    Priority:  Medium     Evaluated by neurology, Dr. Terrace Arabia, Mini-Mental Status exam 29 of 30, name 15 animals and animal naming test.  Normal neurologic exam.  No cognitive changes noted.  Polypharmacy and depression as likely causes.    Hypokalemia 06/19/2018    Priority: Medium     ? GI loss; hasn't had work up. On supplement    Collagenous colitis 01/01/2008    Priority: Medium     Dr. Leone Payor     History of recurrent UTI (urinary tract infection) 04/28/2007    Priority: Medium    Nephrolithiasis 04/28/2007    Priority: Medium    Vitamin B12 deficiency 03/27/2020    Priority: Low   Health Maintenance  Topic Date Due   Medicare Annual Wellness (AWV)  Never done   MAMMOGRAM  01/23/2022   DEXA SCAN  Never done   COVID-19 Vaccine (4 - 2023-24 season) 12/12/2022 (Originally 03/08/2022)   INFLUENZA VACCINE  02/06/2023   Fecal DNA (Cologuard)  06/10/2025   DTaP/Tdap/Td (3 - Td or Tdap) 09/19/2029   Pneumonia Vaccine 70+ Years old  Completed   Hepatitis C Screening  Completed   HIV Screening  Completed   Zoster Vaccines- Shingrix  Completed   HPV VACCINES  Aged Out   COLONOSCOPY (Pts 45-36yrs Insurance coverage will need to be confirmed)  Discontinued   Immunization History  Administered Date(s) Administered   Influenza Split 05/27/2012   Influenza Whole 03/15/2010   Influenza, High Dose Seasonal PF 04/29/2017   Influenza,inj,Quad PF,6+ Mos 06/02/2013, 05/24/2014, 05/09/2015, 05/28/2016, 06/19/2018, 03/31/2019, 07/18/2021, 05/28/2022   Moderna Sars-Covid-2 Vaccination 09/30/2019, 10/26/2019, 06/23/2020   PNEUMOCOCCAL CONJUGATE-20 07/18/2021   Td 12/13/2008   Tdap 09/20/2019   Zoster Recombinat (Shingrix) 03/27/2020, 10/11/2020   We updated and reviewed the patient's past history in detail and it is documented below. Allergies: Patient is allergic to codeine sulfate, moxifloxacin, penicillins, sulfonamide derivatives, biaxin [clarithromycin], diphenhydramine hcl, and tramadol hcl. Past  Medical History Patient  has a past medical history of Anxiety, Bladder stones, Bursitis of shoulder, Chiari malformation type I (HCC), Chronic prescription benzodiazepine use (10/23/2018), COLLAGENOUS COLITIS (01/01/2008), DEPRESSIVE DISORDER (05/21/2010), Fibromyalgia, History of kidney stones, HYPERLIPIDEMIA (12/13/2008), HYPERTENSION (04/28/2007), LOW BACK PAIN (12/02/2007), Migraine, Migraine without aura and without status migrainosus, not intractable (06/19/2018), NEPHROLITHIASIS, HX OF (04/28/2007), PONV (postoperative nausea and vomiting), Raynaud disease, Renal colic (02/12/2008), and UTI'S, RECURRENT (04/28/2007). Past Surgical History Patient  has a past surgical history that includes Abdominal hysterectomy (1990); colonoscopy; Wisdom tooth extraction; Suboccipital craniectomy cervical laminectomy (N/A, 12/11/2018); Ethmoidectomy (Right, 03/01/2019); Frontal sinus exploration (Right, 03/01/2019); Sinus endo with fusion (Right, 03/01/2019); MRI (2021); and radiotherapy ablation (N/A). Family History: Patient family history includes Alcohol abuse in her father; Anxiety disorder in her mother; Arrhythmia in her daughter; Arthritis in her mother; Healthy in her sister; Hyperlipidemia in her brother, brother, brother, daughter, and mother; Hypertension in her brother, brother, and mother; Parkinson's disease in her mother; Polycystic ovary syndrome in her daughter; Suicidality in her father. Social History:  Patient  reports that she quit smoking about 11 years ago. Her smoking use included cigarettes. She has a 15.00 pack-year smoking history. She has never used smokeless tobacco. She reports that she does not drink alcohol and does not use drugs.  Review of Systems: Constitutional: negative for fever or malaise Ophthalmic: negative for photophobia, double vision or loss of vision Cardiovascular: negative for chest pain, dyspnea on exertion, or new LE swelling Respiratory: negative for SOB or persistent  cough Gastrointestinal: negative for abdominal pain, change in bowel habits or melena Genitourinary: negative for dysuria or gross hematuria, no abnormal uterine bleeding or disharge Musculoskeletal: negative for new gait disturbance or muscular weakness Integumentary: negative for new or persistent rashes, no breast lumps Neurological: negative for TIA or stroke symptoms Psychiatric: negative for SI or delusions Allergic/Immunologic: negative for hives  Patient Care Team    Relationship Specialty Notifications Start End  Willow Ora, MD PCP - General Family Medicine  06/19/18   Iva Boop, MD Consulting Physician Gastroenterology  06/19/18   Santiago Glad, MD Referring Physician Specialist  10/23/18    Comment: headache specialist  Newman Pies, MD Consulting Physician Otolaryngology  10/23/18   Lisbeth Renshaw, MD Consulting Physician Neurosurgery  10/23/18   Ardell Isaacs, MD  Pain Medicine  05/18/19     Objective  Vitals: BP 110/60   Pulse 73   Temp 98.6 F (37 C)   Ht 5\' 4"  (1.626  m)   Wt 151 lb (68.5 kg)   SpO2 97%   BMI 25.92 kg/m  General:  Well developed, well nourished, no acute distress  Psych:  Alert and orientedx3,normal mood and affect HEENT:  Normocephalic, atraumatic, non-icteric sclera,  supple neck without adenopathy, mass or thyromegaly Cardiovascular:  Normal S1, S2, RRR without gallop, rub or murmur Respiratory:  Good breath sounds bilaterally, CTAB with normal respiratory effort Gastrointestinal: normal bowel sounds, soft, non-tender, no noted masses. No HSM MSK: extremities without edema, joints without erythema or swelling Neurologic:    Mental status is normal.  Gross motor and sensory exams are normal.  No tremor, mildly unsteady gait  Commons side effects, risks, benefits, and alternatives for medications and treatment plan prescribed today were discussed, and the patient expressed understanding of the given instructions. Patient is  instructed to call or message via MyChart if he/she has any questions or concerns regarding our treatment plan. No barriers to understanding were identified. We discussed Red Flag symptoms and signs in detail. Patient expressed understanding regarding what to do in case of urgent or emergency type symptoms.  Medication list was reconciled, printed and provided to the patient in AVS. Patient instructions and summary information was reviewed with the patient as documented in the AVS. This note was prepared with assistance of Dragon voice recognition software. Occasional wrong-word or sound-a-like substitutions may have occurred due to the inherent limitations of voice recognition software

## 2022-11-29 ENCOUNTER — Ambulatory Visit
Admission: RE | Admit: 2022-11-29 | Discharge: 2022-11-29 | Disposition: A | Discharge status: Home / Self Care | Payer: 59 | Source: Ambulatory Visit | Attending: Family Medicine | Admitting: Family Medicine

## 2022-11-29 DIAGNOSIS — Z1231 Encounter for screening mammogram for malignant neoplasm of breast: Secondary | ICD-10-CM

## 2022-11-29 NOTE — Progress Notes (Signed)
See mychart note Dear [name], Your lab results look mostly excellent.  However, there is a mild anemia which is new. I am not sure of the cause and would like to recheck this in 6 to 12 weeks.  Please schedule a follow-up visit with me so that I can look into it for you.  All other values look good. Sincerely, Dr. Mardelle Matte

## 2022-12-19 DIAGNOSIS — G894 Chronic pain syndrome: Secondary | ICD-10-CM | POA: Diagnosis not present

## 2022-12-19 DIAGNOSIS — M47816 Spondylosis without myelopathy or radiculopathy, lumbar region: Secondary | ICD-10-CM | POA: Diagnosis not present

## 2022-12-19 DIAGNOSIS — M542 Cervicalgia: Secondary | ICD-10-CM | POA: Diagnosis not present

## 2022-12-19 DIAGNOSIS — Z79891 Long term (current) use of opiate analgesic: Secondary | ICD-10-CM | POA: Diagnosis not present

## 2022-12-19 DIAGNOSIS — M47812 Spondylosis without myelopathy or radiculopathy, cervical region: Secondary | ICD-10-CM | POA: Diagnosis not present

## 2022-12-19 DIAGNOSIS — M545 Low back pain, unspecified: Secondary | ICD-10-CM | POA: Diagnosis not present

## 2022-12-19 DIAGNOSIS — M47817 Spondylosis without myelopathy or radiculopathy, lumbosacral region: Secondary | ICD-10-CM | POA: Diagnosis not present

## 2022-12-20 ENCOUNTER — Other Ambulatory Visit: Payer: Self-pay | Admitting: Nurse Practitioner

## 2022-12-20 DIAGNOSIS — M47817 Spondylosis without myelopathy or radiculopathy, lumbosacral region: Secondary | ICD-10-CM

## 2022-12-22 ENCOUNTER — Other Ambulatory Visit: Payer: Self-pay | Admitting: Family Medicine

## 2022-12-23 ENCOUNTER — Other Ambulatory Visit: Payer: Self-pay | Admitting: Family Medicine

## 2022-12-31 ENCOUNTER — Ambulatory Visit
Admission: RE | Admit: 2022-12-31 | Discharge: 2022-12-31 | Disposition: A | Discharge status: Home / Self Care | Payer: Medicare HMO | Source: Ambulatory Visit | Attending: Nurse Practitioner | Admitting: Nurse Practitioner

## 2022-12-31 DIAGNOSIS — M47816 Spondylosis without myelopathy or radiculopathy, lumbar region: Secondary | ICD-10-CM | POA: Diagnosis not present

## 2022-12-31 DIAGNOSIS — M5126 Other intervertebral disc displacement, lumbar region: Secondary | ICD-10-CM | POA: Diagnosis not present

## 2022-12-31 DIAGNOSIS — M47817 Spondylosis without myelopathy or radiculopathy, lumbosacral region: Secondary | ICD-10-CM

## 2023-01-15 DIAGNOSIS — M47816 Spondylosis without myelopathy or radiculopathy, lumbar region: Secondary | ICD-10-CM | POA: Diagnosis not present

## 2023-01-15 DIAGNOSIS — M47812 Spondylosis without myelopathy or radiculopathy, cervical region: Secondary | ICD-10-CM | POA: Diagnosis not present

## 2023-01-15 DIAGNOSIS — M545 Low back pain, unspecified: Secondary | ICD-10-CM | POA: Diagnosis not present

## 2023-01-15 DIAGNOSIS — M542 Cervicalgia: Secondary | ICD-10-CM | POA: Diagnosis not present

## 2023-01-15 DIAGNOSIS — Z79891 Long term (current) use of opiate analgesic: Secondary | ICD-10-CM | POA: Diagnosis not present

## 2023-01-15 DIAGNOSIS — G894 Chronic pain syndrome: Secondary | ICD-10-CM | POA: Diagnosis not present

## 2023-01-23 DIAGNOSIS — M5412 Radiculopathy, cervical region: Secondary | ICD-10-CM | POA: Diagnosis not present

## 2023-01-28 ENCOUNTER — Other Ambulatory Visit: Payer: Self-pay | Admitting: Family Medicine

## 2023-01-28 DIAGNOSIS — Z780 Asymptomatic menopausal state: Secondary | ICD-10-CM

## 2023-02-18 DIAGNOSIS — M542 Cervicalgia: Secondary | ICD-10-CM | POA: Diagnosis not present

## 2023-02-18 DIAGNOSIS — G894 Chronic pain syndrome: Secondary | ICD-10-CM | POA: Diagnosis not present

## 2023-02-18 DIAGNOSIS — Z79891 Long term (current) use of opiate analgesic: Secondary | ICD-10-CM | POA: Diagnosis not present

## 2023-02-18 DIAGNOSIS — M47812 Spondylosis without myelopathy or radiculopathy, cervical region: Secondary | ICD-10-CM | POA: Diagnosis not present

## 2023-02-18 DIAGNOSIS — M545 Low back pain, unspecified: Secondary | ICD-10-CM | POA: Diagnosis not present

## 2023-02-18 DIAGNOSIS — M47816 Spondylosis without myelopathy or radiculopathy, lumbar region: Secondary | ICD-10-CM | POA: Diagnosis not present

## 2023-02-27 DIAGNOSIS — M5416 Radiculopathy, lumbar region: Secondary | ICD-10-CM | POA: Diagnosis not present

## 2023-03-18 ENCOUNTER — Other Ambulatory Visit: Payer: Self-pay | Admitting: Nurse Practitioner

## 2023-03-18 DIAGNOSIS — M5412 Radiculopathy, cervical region: Secondary | ICD-10-CM

## 2023-03-18 DIAGNOSIS — G894 Chronic pain syndrome: Secondary | ICD-10-CM | POA: Diagnosis not present

## 2023-03-18 DIAGNOSIS — M47816 Spondylosis without myelopathy or radiculopathy, lumbar region: Secondary | ICD-10-CM | POA: Diagnosis not present

## 2023-03-18 DIAGNOSIS — M47812 Spondylosis without myelopathy or radiculopathy, cervical region: Secondary | ICD-10-CM | POA: Diagnosis not present

## 2023-03-18 DIAGNOSIS — M542 Cervicalgia: Secondary | ICD-10-CM | POA: Diagnosis not present

## 2023-03-18 DIAGNOSIS — M545 Low back pain, unspecified: Secondary | ICD-10-CM | POA: Diagnosis not present

## 2023-03-18 DIAGNOSIS — Z79891 Long term (current) use of opiate analgesic: Secondary | ICD-10-CM | POA: Diagnosis not present

## 2023-03-21 ENCOUNTER — Encounter: Payer: Self-pay | Admitting: Nurse Practitioner

## 2023-03-28 ENCOUNTER — Ambulatory Visit
Admission: RE | Admit: 2023-03-28 | Discharge: 2023-03-28 | Disposition: A | Discharge status: Home / Self Care | Payer: Medicare HMO | Source: Ambulatory Visit | Attending: Nurse Practitioner | Admitting: Nurse Practitioner

## 2023-03-28 DIAGNOSIS — M5412 Radiculopathy, cervical region: Secondary | ICD-10-CM

## 2023-03-28 DIAGNOSIS — M50122 Cervical disc disorder at C5-C6 level with radiculopathy: Secondary | ICD-10-CM | POA: Diagnosis not present

## 2023-04-16 ENCOUNTER — Encounter: Payer: Self-pay | Admitting: Family Medicine

## 2023-04-16 DIAGNOSIS — M5416 Radiculopathy, lumbar region: Secondary | ICD-10-CM | POA: Diagnosis not present

## 2023-04-16 DIAGNOSIS — M47816 Spondylosis without myelopathy or radiculopathy, lumbar region: Secondary | ICD-10-CM | POA: Diagnosis not present

## 2023-04-16 DIAGNOSIS — Z79891 Long term (current) use of opiate analgesic: Secondary | ICD-10-CM | POA: Diagnosis not present

## 2023-04-16 DIAGNOSIS — G894 Chronic pain syndrome: Secondary | ICD-10-CM | POA: Diagnosis not present

## 2023-04-16 DIAGNOSIS — M545 Low back pain, unspecified: Secondary | ICD-10-CM | POA: Diagnosis not present

## 2023-04-16 DIAGNOSIS — M5412 Radiculopathy, cervical region: Secondary | ICD-10-CM | POA: Diagnosis not present

## 2023-04-16 DIAGNOSIS — M47812 Spondylosis without myelopathy or radiculopathy, cervical region: Secondary | ICD-10-CM | POA: Diagnosis not present

## 2023-04-16 DIAGNOSIS — M542 Cervicalgia: Secondary | ICD-10-CM | POA: Diagnosis not present

## 2023-04-17 ENCOUNTER — Other Ambulatory Visit: Payer: Self-pay

## 2023-04-17 DIAGNOSIS — E876 Hypokalemia: Secondary | ICD-10-CM

## 2023-04-17 MED ORDER — POTASSIUM CHLORIDE CRYS ER 20 MEQ PO TBCR
20.0000 meq | EXTENDED_RELEASE_TABLET | Freq: Two times a day (BID) | ORAL | 3 refills | Status: DC
Start: 2023-04-17 — End: 2023-07-28

## 2023-04-17 MED ORDER — ATORVASTATIN CALCIUM 40 MG PO TABS: 40.0000 mg | ORAL_TABLET | Freq: Every day | ORAL | 3 refills | Status: DC

## 2023-04-17 MED ORDER — VERAPAMIL HCL ER 240 MG PO TBCR: 240.0000 mg | EXTENDED_RELEASE_TABLET | Freq: Every day | ORAL | 3 refills | Status: DC

## 2023-04-17 NOTE — Telephone Encounter (Addendum)
I have sent in the medications as pt requested. I advised her that she will need to contact her pharmacy to give them the updated insurance information

## 2023-05-14 DIAGNOSIS — M47816 Spondylosis without myelopathy or radiculopathy, lumbar region: Secondary | ICD-10-CM | POA: Diagnosis not present

## 2023-05-14 DIAGNOSIS — Z79891 Long term (current) use of opiate analgesic: Secondary | ICD-10-CM | POA: Diagnosis not present

## 2023-05-14 DIAGNOSIS — G894 Chronic pain syndrome: Secondary | ICD-10-CM | POA: Diagnosis not present

## 2023-05-14 DIAGNOSIS — M5416 Radiculopathy, lumbar region: Secondary | ICD-10-CM | POA: Diagnosis not present

## 2023-05-14 DIAGNOSIS — M545 Low back pain, unspecified: Secondary | ICD-10-CM | POA: Diagnosis not present

## 2023-05-14 DIAGNOSIS — M542 Cervicalgia: Secondary | ICD-10-CM | POA: Diagnosis not present

## 2023-05-29 DIAGNOSIS — M47812 Spondylosis without myelopathy or radiculopathy, cervical region: Secondary | ICD-10-CM | POA: Diagnosis not present

## 2023-06-19 DIAGNOSIS — G8929 Other chronic pain: Secondary | ICD-10-CM | POA: Diagnosis not present

## 2023-06-19 DIAGNOSIS — M47812 Spondylosis without myelopathy or radiculopathy, cervical region: Secondary | ICD-10-CM | POA: Diagnosis not present

## 2023-06-19 DIAGNOSIS — Z79891 Long term (current) use of opiate analgesic: Secondary | ICD-10-CM | POA: Diagnosis not present

## 2023-06-20 DIAGNOSIS — Z79891 Long term (current) use of opiate analgesic: Secondary | ICD-10-CM | POA: Diagnosis not present

## 2023-06-30 DIAGNOSIS — M47812 Spondylosis without myelopathy or radiculopathy, cervical region: Secondary | ICD-10-CM | POA: Diagnosis not present

## 2023-07-05 ENCOUNTER — Other Ambulatory Visit: Payer: Self-pay | Admitting: Family Medicine

## 2023-07-27 ENCOUNTER — Encounter: Payer: Self-pay | Admitting: Family Medicine

## 2023-07-28 ENCOUNTER — Other Ambulatory Visit: Payer: Self-pay

## 2023-07-28 DIAGNOSIS — Z79891 Long term (current) use of opiate analgesic: Secondary | ICD-10-CM | POA: Diagnosis not present

## 2023-07-28 DIAGNOSIS — M47816 Spondylosis without myelopathy or radiculopathy, lumbar region: Secondary | ICD-10-CM | POA: Diagnosis not present

## 2023-07-28 DIAGNOSIS — M47812 Spondylosis without myelopathy or radiculopathy, cervical region: Secondary | ICD-10-CM | POA: Diagnosis not present

## 2023-07-28 DIAGNOSIS — M545 Low back pain, unspecified: Secondary | ICD-10-CM | POA: Diagnosis not present

## 2023-07-28 DIAGNOSIS — E876 Hypokalemia: Secondary | ICD-10-CM

## 2023-07-28 DIAGNOSIS — M5416 Radiculopathy, lumbar region: Secondary | ICD-10-CM | POA: Diagnosis not present

## 2023-07-28 DIAGNOSIS — G894 Chronic pain syndrome: Secondary | ICD-10-CM | POA: Diagnosis not present

## 2023-07-28 DIAGNOSIS — M542 Cervicalgia: Secondary | ICD-10-CM | POA: Diagnosis not present

## 2023-07-28 MED ORDER — ATORVASTATIN CALCIUM 40 MG PO TABS: 40.0000 mg | ORAL_TABLET | Freq: Every day | ORAL | 3 refills | Status: DC

## 2023-07-28 MED ORDER — POTASSIUM CHLORIDE CRYS ER 20 MEQ PO TBCR: 20.0000 meq | EXTENDED_RELEASE_TABLET | Freq: Two times a day (BID) | ORAL | 3 refills | Status: DC

## 2023-07-28 MED ORDER — VERAPAMIL HCL ER 240 MG PO TBCR: 240.0000 mg | EXTENDED_RELEASE_TABLET | Freq: Every day | ORAL | 3 refills | Status: DC

## 2023-07-28 NOTE — Telephone Encounter (Signed)
Rx has been sent  

## 2023-08-01 ENCOUNTER — Ambulatory Visit
Admission: RE | Admit: 2023-08-01 | Discharge: 2023-08-01 | Disposition: A | Discharge status: Home / Self Care | Payer: Medicare HMO | Source: Ambulatory Visit | Attending: Family Medicine | Admitting: Family Medicine

## 2023-08-01 DIAGNOSIS — N958 Other specified menopausal and perimenopausal disorders: Secondary | ICD-10-CM | POA: Diagnosis not present

## 2023-08-01 DIAGNOSIS — M8588 Other specified disorders of bone density and structure, other site: Secondary | ICD-10-CM | POA: Diagnosis not present

## 2023-08-01 DIAGNOSIS — E2839 Other primary ovarian failure: Secondary | ICD-10-CM | POA: Diagnosis not present

## 2023-08-01 DIAGNOSIS — Z780 Asymptomatic menopausal state: Secondary | ICD-10-CM

## 2023-08-26 DIAGNOSIS — M542 Cervicalgia: Secondary | ICD-10-CM | POA: Diagnosis not present

## 2023-08-26 DIAGNOSIS — Z79891 Long term (current) use of opiate analgesic: Secondary | ICD-10-CM | POA: Diagnosis not present

## 2023-08-26 DIAGNOSIS — M47816 Spondylosis without myelopathy or radiculopathy, lumbar region: Secondary | ICD-10-CM | POA: Diagnosis not present

## 2023-08-26 DIAGNOSIS — G894 Chronic pain syndrome: Secondary | ICD-10-CM | POA: Diagnosis not present

## 2023-08-26 DIAGNOSIS — M545 Low back pain, unspecified: Secondary | ICD-10-CM | POA: Diagnosis not present

## 2023-08-26 DIAGNOSIS — M5416 Radiculopathy, lumbar region: Secondary | ICD-10-CM | POA: Diagnosis not present

## 2023-08-26 DIAGNOSIS — M47812 Spondylosis without myelopathy or radiculopathy, cervical region: Secondary | ICD-10-CM | POA: Diagnosis not present

## 2023-08-28 ENCOUNTER — Encounter: Payer: Self-pay | Admitting: Family Medicine

## 2023-08-28 DIAGNOSIS — M810 Age-related osteoporosis without current pathological fracture: Secondary | ICD-10-CM | POA: Insufficient documentation

## 2023-08-28 NOTE — Progress Notes (Signed)
 See mychart note.  Dexa 07/2023: lowest T + -2.5, rec treatment/ov. cla Ms.Flemmer, Thank you for getting your bone density screening test done. I have reviewed the results. Your bone mass is low or in the osteoporosis range. This increases your risk of fractures and treatment is recommended. Please schedule an office visit to discuss.  Thank you.   Sincerely, Dr. Mardelle Matte

## 2023-09-04 DIAGNOSIS — M47812 Spondylosis without myelopathy or radiculopathy, cervical region: Secondary | ICD-10-CM | POA: Diagnosis not present

## 2023-09-22 DIAGNOSIS — M47812 Spondylosis without myelopathy or radiculopathy, cervical region: Secondary | ICD-10-CM | POA: Diagnosis not present

## 2023-10-22 ENCOUNTER — Other Ambulatory Visit: Payer: Self-pay | Admitting: Family Medicine

## 2023-10-28 DIAGNOSIS — Z79891 Long term (current) use of opiate analgesic: Secondary | ICD-10-CM | POA: Diagnosis not present

## 2023-10-28 DIAGNOSIS — M5416 Radiculopathy, lumbar region: Secondary | ICD-10-CM | POA: Diagnosis not present

## 2023-10-28 DIAGNOSIS — G894 Chronic pain syndrome: Secondary | ICD-10-CM | POA: Diagnosis not present

## 2023-10-28 DIAGNOSIS — M47812 Spondylosis without myelopathy or radiculopathy, cervical region: Secondary | ICD-10-CM | POA: Diagnosis not present

## 2023-10-28 DIAGNOSIS — M47816 Spondylosis without myelopathy or radiculopathy, lumbar region: Secondary | ICD-10-CM | POA: Diagnosis not present

## 2023-10-28 DIAGNOSIS — M542 Cervicalgia: Secondary | ICD-10-CM | POA: Diagnosis not present

## 2023-10-28 DIAGNOSIS — M545 Low back pain, unspecified: Secondary | ICD-10-CM | POA: Diagnosis not present

## 2023-11-10 DIAGNOSIS — H25813 Combined forms of age-related cataract, bilateral: Secondary | ICD-10-CM | POA: Diagnosis not present

## 2023-11-27 ENCOUNTER — Ambulatory Visit (INDEPENDENT_AMBULATORY_CARE_PROVIDER_SITE_OTHER): Payer: 59 | Admitting: Family Medicine

## 2023-11-27 ENCOUNTER — Encounter: Payer: Self-pay | Admitting: Family Medicine

## 2023-11-27 VITALS — BP 120/70 | HR 76 | Temp 97.7°F | Ht 64.0 in | Wt 146.0 lb

## 2023-11-27 DIAGNOSIS — M797 Fibromyalgia: Secondary | ICD-10-CM

## 2023-11-27 DIAGNOSIS — F1190 Opioid use, unspecified, uncomplicated: Secondary | ICD-10-CM | POA: Diagnosis not present

## 2023-11-27 DIAGNOSIS — F5104 Psychophysiologic insomnia: Secondary | ICD-10-CM

## 2023-11-27 DIAGNOSIS — M810 Age-related osteoporosis without current pathological fracture: Secondary | ICD-10-CM

## 2023-11-27 DIAGNOSIS — E785 Hyperlipidemia, unspecified: Secondary | ICD-10-CM | POA: Diagnosis not present

## 2023-11-27 DIAGNOSIS — Z79891 Long term (current) use of opiate analgesic: Secondary | ICD-10-CM | POA: Diagnosis not present

## 2023-11-27 DIAGNOSIS — M47814 Spondylosis without myelopathy or radiculopathy, thoracic region: Secondary | ICD-10-CM | POA: Diagnosis not present

## 2023-11-27 DIAGNOSIS — N1831 Chronic kidney disease, stage 3a: Secondary | ICD-10-CM

## 2023-11-27 DIAGNOSIS — Z0001 Encounter for general adult medical examination with abnormal findings: Secondary | ICD-10-CM

## 2023-11-27 DIAGNOSIS — D649 Anemia, unspecified: Secondary | ICD-10-CM

## 2023-11-27 DIAGNOSIS — M542 Cervicalgia: Secondary | ICD-10-CM | POA: Diagnosis not present

## 2023-11-27 DIAGNOSIS — F339 Major depressive disorder, recurrent, unspecified: Secondary | ICD-10-CM | POA: Diagnosis not present

## 2023-11-27 DIAGNOSIS — I10 Essential (primary) hypertension: Secondary | ICD-10-CM

## 2023-11-27 DIAGNOSIS — E876 Hypokalemia: Secondary | ICD-10-CM

## 2023-11-27 DIAGNOSIS — Q0700 Arnold-Chiari syndrome without spina bifida or hydrocephalus: Secondary | ICD-10-CM

## 2023-11-27 DIAGNOSIS — G894 Chronic pain syndrome: Secondary | ICD-10-CM | POA: Diagnosis not present

## 2023-11-27 DIAGNOSIS — E538 Deficiency of other specified B group vitamins: Secondary | ICD-10-CM

## 2023-11-27 DIAGNOSIS — M546 Pain in thoracic spine: Secondary | ICD-10-CM | POA: Diagnosis not present

## 2023-11-27 DIAGNOSIS — M47812 Spondylosis without myelopathy or radiculopathy, cervical region: Secondary | ICD-10-CM | POA: Diagnosis not present

## 2023-11-27 LAB — CBC WITH DIFFERENTIAL/PLATELET
Basophils Absolute: 0.1 10*3/uL (ref 0.0–0.1)
Basophils Relative: 1.1 % (ref 0.0–3.0)
Eosinophils Absolute: 0.4 10*3/uL (ref 0.0–0.7)
Eosinophils Relative: 7.3 % — ABNORMAL HIGH (ref 0.0–5.0)
HCT: 34.5 % — ABNORMAL LOW (ref 36.0–46.0)
Hemoglobin: 11.6 g/dL — ABNORMAL LOW (ref 12.0–15.0)
Lymphocytes Relative: 30 % (ref 12.0–46.0)
Lymphs Abs: 1.8 10*3/uL (ref 0.7–4.0)
MCHC: 33.6 g/dL (ref 30.0–36.0)
MCV: 90.5 fl (ref 78.0–100.0)
Monocytes Absolute: 0.3 10*3/uL (ref 0.1–1.0)
Monocytes Relative: 5.4 % (ref 3.0–12.0)
Neutro Abs: 3.4 10*3/uL (ref 1.4–7.7)
Neutrophils Relative %: 56.2 % (ref 43.0–77.0)
Platelets: 274 10*3/uL (ref 150.0–400.0)
RBC: 3.81 Mil/uL — ABNORMAL LOW (ref 3.87–5.11)
RDW: 13 % (ref 11.5–15.5)
WBC: 6 10*3/uL (ref 4.0–10.5)

## 2023-11-27 LAB — COMPREHENSIVE METABOLIC PANEL WITH GFR
ALT: 19 U/L (ref 0–35)
AST: 19 U/L (ref 0–37)
Albumin: 4 g/dL (ref 3.5–5.2)
Alkaline Phosphatase: 88 U/L (ref 39–117)
BUN: 14 mg/dL (ref 6–23)
CO2: 28 meq/L (ref 19–32)
Calcium: 8.9 mg/dL (ref 8.4–10.5)
Chloride: 104 meq/L (ref 96–112)
Creatinine, Ser: 0.93 mg/dL (ref 0.40–1.20)
GFR: 64.28 mL/min (ref >60.00)
Glucose, Bld: 92 mg/dL (ref 70–99)
Potassium: 4.3 meq/L (ref 3.5–5.1)
Sodium: 139 meq/L (ref 135–145)
Total Bilirubin: 0.4 mg/dL (ref 0.2–1.2)
Total Protein: 7 g/dL (ref 6.0–8.3)

## 2023-11-27 LAB — LIPID PANEL
Cholesterol: 154 mg/dL (ref 0–200)
HDL: 63.5 mg/dL (ref >39.00)
LDL Cholesterol: 72 mg/dL (ref 0–99)
NonHDL: 90.44
Total CHOL/HDL Ratio: 2
Triglycerides: 94 mg/dL (ref 0.0–149.0)
VLDL: 18.8 mg/dL (ref 0.0–40.0)

## 2023-11-27 LAB — TSH: TSH: 0.78 u[IU]/mL (ref 0.35–5.50)

## 2023-11-27 LAB — VITAMIN B12: Vitamin B-12: 958 pg/mL — ABNORMAL HIGH (ref 211–911)

## 2023-11-27 MED ORDER — ALENDRONATE SODIUM 70 MG PO TABS: 70.0000 mg | ORAL_TABLET | ORAL | 3 refills | Status: AC

## 2023-11-27 NOTE — Patient Instructions (Addendum)
 Please return in for your annual complete physical; please come fasting. For follow up on chronic medical conditions   I will release your lab results to you on your MyChart account with further instructions. You may see the results before I do, but when I review them I will send you a message with my report or have my assistant call you if things need to be discussed. Please reply to my message with any questions. Thank you!   If you have any questions or concerns, please don't hesitate to send me a message via MyChart or call the office at [phone]. Thank you for visiting with us  today! It's our pleasure caring for you.    VISIT SUMMARY: Today, you came in for a routine follow-up visit. We discussed your ongoing health concerns, including your role as a caregiver, your blood pressure, bone health, and mild anemia. We also reviewed your general health maintenance needs.  YOUR PLAN: -OSTEOPOROSIS: Osteoporosis is a condition where bones become weak and are more likely to break. Your T-score of -2.5 indicates increased fracture risk. We will start you on bisphosphonate therapy (e.g., Fosamax) to prevent bone loss. Take this medication once a week on an empty stomach with a full glass of water, and remain upright for 30 minutes after taking it. Continue with calcium  and vitamin D  supplements, and keep up with your exercise routine. We will reassess your bone density in two years.  -HYPERTENSION: Hypertension, or high blood pressure, is well-controlled with your current medication. Your blood pressure is around 120/65 mmHg. It's important to continue taking your medication as prescribed and monitor your blood pressure regularly. Please report any significant changes.  -MILD ANEMIA: Mild anemia is a condition where you have fewer red blood cells than normal, which can cause fatigue and weakness. Although you have no symptoms, we will recheck your lab work to monitor your anemia status. This will  include a complete blood count.  -GENERAL HEALTH MAINTENANCE: We discussed routine health maintenance. You are due for a mammogram and should schedule it soon. Additionally, it is recommended that you receive a flu vaccination at the end of August or early September.  INSTRUCTIONS: Please schedule and complete your mammogram. Also, make sure to get your flu vaccination at the end of August or early September. Continue to monitor your blood pressure regularly and report any significant changes. We will recheck your lab work to monitor your anemia status, including a complete blood count.                      Contains text generated by [employer]bridge.                                 Contains text generated by [employer]bridge.

## 2023-11-27 NOTE — Progress Notes (Signed)
 Subjective  Chief Complaint  Patient presents with   Annual Exam    Pt here for Annual Exam and is currently fasting   Hypertension    HPI: [name] is a 68 y.o. female who presents to [facility] [place] today for a Female Wellness Visit. She also has the concerns and/or needs as listed above in the chief complaint. These will be addressed in addition to the Health Maintenance Visit.   Wellness Visit: annual visit with health maintenance review and exam  HM: due mammo now; pt to schedule. CRC cologuard current. Dexa: see below. Eye exam: glaucoma suspect. Imms up to date. Healthy lifestyl: 72 year old mother with Parkinson's moved in with her and her husband in December.  She is the primary caregiver.  Managing stressors well.  Feels overwhelmed at times however.  Mood medications are controlling her depression well. Chronic disease f/u and/or acute problem visit: (deemed necessary to be done in addition to the wellness visit): Hypertension with kidney disease: Blood pressure elevated in office today however she did not take her medication this morning.  She gets regular blood pressure checks at outside office visits and they remain very normal, averaging 110-120 over 70s.  No chest pain or shortness of breath. Due for recheck of lipids on atorvastatin  40.  She tolerates this well. Chronic pain syndrome and fibromyalgia managed with chronic narcotics per pain management.  At her checkup today.  All stable Mild anemia found on labs incidentally last May.  She denies symptoms of anemia.  No melena.  Due for recheck. Takes oral vitamin B12 Density DEXA shows lowest T- 2.5, new diagnosis.  No history of fractures. Chronic hypokalemia: On twice daily dosing of potassium.  Due for recheck  Assessment  1. Encounter for well adult exam with abnormal findings   2. Essential hypertension   3. Dyslipidemia   4. Fibromyalgia   5. Hypertensive kidney disease with stage 3a  chronic kidney disease (HCC)   6. Major depression, recurrent, chronic (HCC)   7. Chronic insomnia   8. Chronic pain syndrome   9. Chronic narcotic use   10. Vitamin B12 deficiency   11. Normochromic anemia   12. Age-related osteoporosis without current pathological fracture   13. Chronic hypokalemia   14. 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 Included topics are cancer screening recommendations, ways to keep healthy (see AVS) including dietary and exercise recommendations, regular eye and dental care, use of seat belts, and avoidance of moderate alcohol use and tobacco use.  Patient to schedule mammogram.  Other screens current BMI: discussed patient's BMI and encouraged positive lifestyle modifications to help get to or maintain a target BMI. HM needs and immunizations were addressed and ordered. See below for orders. See HM and immunization section for updates. Routine labs and screening tests ordered including cmp, cbc and lipids where appropriate. Discussed recommendations regarding Vit D and calcium  supplementation (see AVS)  Chronic disease management visit and/or acute problem visit: Osteoporosis: New diagnosis.  Education and prognosis given.  Recommend Fosamax.  Discussed appropriate administration, monitor for side effects.  Recheck bone density 2 years.  Continue calcium , vitamin D  and weightbearing exercise. Recheck labs for hypertension, hyperlipidemia, hypokalemia.  Monitoring kidney function. Start workup for mild anemia.  No symptoms Major depression this medical condition is well controlled. There are no signs of complications, medication side effects, or red  flags. Patient is instructed to continue the current treatment plan without change in therapies or medications. Fibromyalgia and chronic pain syndrome currently managed by chronic pain management,  reportedly stable  Follow up: 12 months for recheck, complete physical Orders Placed This Encounter  Procedures   TSH   CBC with Differential/Platelet   Comprehensive metabolic panel with GFR   Lipid panel   Vitamin B12   Iron, TIBC and Ferritin Panel   Meds ordered this encounter  Medications   alendronate (FOSAMAX) 70 MG tablet    Sig: Take 1 tablet (70 mg total) by mouth once a week. Take with a full glass of water on an empty stomach.    Dispense:  12 tablet    Refill:  3      Body mass index is 25.06 kg/m. Wt Readings from Last 3 Encounters:  11/27/23 146 lb (66.2 kg)  11/26/22 151 lb (68.5 kg)  05/28/22 157 lb 9.6 oz (71.5 kg)     Patient Active Problem List   Diagnosis Date Noted Date Diagnosed   Chronic insomnia 01/15/2021     Priority: High   Hypertensive kidney disease with CKD stage III (HCC) 10/14/2020     Priority: High    GFR 48 09/2020    Arnold-Chiari malformation (HCC) 05/18/2019     Priority: High   Chronic prescription benzodiazepine use 10/23/2018     Priority: High   Chronic narcotic use 06/19/2018     Priority: High   Migraine without aura and without status migrainosus, not intractable 06/19/2018     Priority: High   Fibromyalgia 12/01/2012     Priority: High    Has failed gabapentin , lyrica, tramadol. Has seen Dr. Alvira Josephs in the past.     Major depression, recurrent, chronic (HCC) 05/21/2010     Priority: High   Dyslipidemia 12/13/2008     Priority: High   Chronic pain 12/02/2007     Priority: High    Dr. Lamar Pillar for chronic pain Head, neck, and back    Essential hypertension 04/28/2007     Priority: High   Osteoporosis 08/28/2023     Priority: Medium     Dexa 07/2023: lowest T + -2.5, rec treatment/ov. cla    Chronic migraine 05/18/2019     Priority: Medium    Memory loss 05/18/2019     Priority: Medium     Evaluated by neurology, Dr. Gracie Lav, Mini-Mental Status exam 29 of 30, name 15 animals and animal naming test.  Normal  neurologic exam.  No cognitive changes noted.  Polypharmacy and depression as likely causes.    Chronic hypokalemia 06/19/2018     Priority: Medium     ? GI loss; hasn't had work up. On supplement    Collagenous colitis 01/01/2008     Priority: Medium     Dr. Willy Harvest     History of recurrent UTI (urinary tract infection) 04/28/2007     Priority: Medium    Nephrolithiasis 04/28/2007     Priority: Medium    Vitamin B12 deficiency 03/27/2020     Priority: Low   Health Maintenance  Topic Date Due   Medicare Annual Wellness (AWV)  Never done   COVID-19 Vaccine (4 - 2024-25 season) 12/13/2023 (Originally 03/09/2023)   MAMMOGRAM  11/29/2023   INFLUENZA VACCINE  02/06/2024   Fecal DNA (Cologuard)  06/10/2025   DEXA SCAN  07/31/2025   DTaP/Tdap/Td (3 - Td or Tdap) 09/19/2029   Pneumonia Vaccine 72+ Years old  Completed  Hepatitis C Screening  Completed   Zoster Vaccines- Shingrix  Completed   HPV VACCINES  Aged Out   Meningococcal B Vaccine  Aged Out   Colonoscopy  Discontinued   Immunization History  Administered Date(s) Administered   Influenza Split 05/27/2012   Influenza Whole 03/15/2010   Influenza, High Dose Seasonal PF 04/29/2017   Influenza,inj,Quad PF,6+ Mos 06/02/2013, 05/24/2014, 05/09/2015, 05/28/2016, 06/19/2018, 03/31/2019, 07/18/2021, 05/28/2022   Moderna Sars-Covid-2 Vaccination 09/30/2019, 10/26/2019, 06/23/2020   PNEUMOCOCCAL CONJUGATE-20 07/18/2021   Td 12/13/2008   Tdap 09/20/2019   Zoster Recombinant(Shingrix) 03/27/2020, 10/11/2020   We updated and reviewed the patient's past history in detail and it is documented below. Allergies: Patient is allergic to codeine sulfate, moxifloxacin, penicillins, sulfonamide derivatives, biaxin [clarithromycin], diphenhydramine  hcl, and tramadol hcl. Past Medical History Patient  has a past medical history of Anxiety, Bladder stones, Bursitis of shoulder, Chiari malformation type I (HCC), Chronic prescription  benzodiazepine use (10/23/2018), COLLAGENOUS COLITIS (01/01/2008), DEPRESSIVE DISORDER (05/21/2010), Fibromyalgia, History of kidney stones, HYPERLIPIDEMIA (12/13/2008), HYPERTENSION (04/28/2007), LOW BACK PAIN (12/02/2007), Migraine, Migraine without aura and without status migrainosus, not intractable (06/19/2018), NEPHROLITHIASIS, HX OF (04/28/2007), PONV (postoperative nausea and vomiting), Raynaud disease, Renal colic (02/12/2008), and UTI'S, RECURRENT (04/28/2007). Past Surgical History Patient  has a past surgical history that includes Abdominal hysterectomy (1990); colonoscopy; Wisdom tooth extraction; Suboccipital craniectomy cervical laminectomy (N/A, 12/11/2018); Ethmoidectomy (Right, 03/01/2019); Frontal sinus exploration (Right, 03/01/2019); Sinus endo with fusion (Right, 03/01/2019); MRI (2021); and radiotherapy ablation (N/A). Family History: Patient family history includes Alcohol abuse in her father; Anxiety disorder in her mother; Arrhythmia in her daughter; Arthritis in her mother; Healthy in her sister; Hyperlipidemia in her brother, brother, brother, daughter, and mother; Hypertension in her brother, brother, and mother; Parkinson's disease in her mother; Polycystic ovary syndrome in her daughter; Suicidality in her father. Social History:  Patient  reports that she quit smoking about 12 years ago. Her smoking use included cigarettes. She started smoking about 27 years ago. She has a 15 pack-year smoking history. She has never used smokeless tobacco. She reports that she does not drink alcohol and does not use drugs.  Review of Systems: Constitutional: negative for fever or malaise Ophthalmic: negative for photophobia, double vision or loss of vision Cardiovascular: negative for chest pain, dyspnea on exertion, or new LE swelling Respiratory: negative for SOB or persistent cough Gastrointestinal: negative for abdominal pain, change in bowel habits or melena Genitourinary: negative for dysuria  or gross hematuria, no abnormal uterine bleeding or disharge Musculoskeletal: negative for new gait disturbance or muscular weakness Integumentary: negative for new or persistent rashes, no breast lumps Neurological: negative for TIA or stroke symptoms Psychiatric: negative for SI or delusions Allergic/Immunologic: negative for hives  Patient Care Team    Relationship Specialty Notifications Start End  Luevenia Saha, MD PCP - General Family Medicine  06/19/18   Kenney Peacemaker, MD Consulting Physician Gastroenterology  06/19/18   Reinhold Carbine, MD Referring Physician Specialist  10/23/18    Comment: headache specialist  Reynold Caves, MD Consulting Physician Otolaryngology  10/23/18   Augusto Blonder, MD Consulting Physician Neurosurgery  10/23/18   Evangeline Hilts, MD  Pain Medicine  05/18/19     Objective  Vitals: BP 120/70 Comment: by frequent outside readings  Pulse 76   Temp 97.7 F (36.5 C)   Ht 5\' 4"  (1.626 m)   Wt 146 lb (66.2 kg)   SpO2 99%   BMI 25.06 kg/m  General:  Well developed, well nourished, no  acute distress, appears well Psych:  Alert and orientedx3,normal mood and affect HEENT:  Normocephalic, atraumatic, non-icteric sclera,  supple neck without adenopathy, mass or thyromegaly Cardiovascular:  Normal S1, S2, RRR without gallop, rub or murmur Respiratory:  Good breath sounds bilaterally, CTAB with normal respiratory effort Gastrointestinal: normal bowel sounds, soft, non-tender, no noted masses. No HSM MSK: extremities without edema, joints without erythema or swelling Neurologic:    Mental status is normal.  Gross motor and sensory exams are normal.  No tremor  Commons side effects, risks, benefits, and alternatives for medications and treatment plan prescribed today were discussed, and the patient expressed understanding of the given instructions. Patient is instructed to call or message via MyChart if he/she has any questions or concerns regarding our  treatment plan. No barriers to understanding were identified. We discussed Red Flag symptoms and signs in detail. Patient expressed understanding regarding what to do in case of urgent or emergency type symptoms.  Medication list was reconciled, printed and provided to the patient in AVS. Patient instructions and summary information was reviewed with the patient as documented in the AVS. This note was prepared with assistance of Dragon voice recognition software. Occasional wrong-word or sound-a-like substitutions may have occurred due to the inherent limitations of voice recognition software

## 2023-11-29 LAB — IRON,TIBC AND FERRITIN PANEL
%SAT: 19 % (ref 16–45)
Ferritin: 16 ng/mL (ref 16–288)
Iron: 73 ug/dL (ref 45–160)
TIBC: 379 ug/dL (ref 250–450)

## 2023-12-02 ENCOUNTER — Ambulatory Visit: Payer: Self-pay | Admitting: Family Medicine

## 2023-12-02 NOTE — Progress Notes (Signed)
 See mychart note mild low iron and mild anemia: unclear source. Neg cologuard 2023. Dear [name], It was good seeing you! Your lab results all look good; however, your mild anemia remains. Your iron levels are on the low normal side: this could be the cause.  Please start iron supplements (otc) twice a day. Add an antiacid pepcid or prilosec if you are having any heartburn or reflux symptoms.  Your other labs are fine. I will monitor your hgb and blood count.  Sincerely, Dr. Jonelle Neri

## 2023-12-18 ENCOUNTER — Ambulatory Visit

## 2023-12-18 VITALS — Ht 63.5 in | Wt 146.0 lb

## 2023-12-18 DIAGNOSIS — Z0000 Encounter for general adult medical examination without abnormal findings: Secondary | ICD-10-CM | POA: Diagnosis not present

## 2023-12-18 NOTE — Progress Notes (Signed)
 Subjective:   [name] is a 68 y.o. who presents for a Medicare Wellness preventive visit.  As a reminder, Annual Wellness Visits don't include a physical exam, and some assessments may be limited, especially if this visit is performed virtually. We may recommend an in-person follow-up visit with your provider if needed.  Visit Complete: Virtual I connected with  Beatryce L Pizano on 12/18/23 by a audio enabled telemedicine application and verified that I am speaking with the correct person using two identifiers.  Patient Location: Home  Provider Location: Office/Clinic  I discussed the limitations of evaluation and management by telemedicine. The patient expressed understanding and agreed to proceed.  Vital Signs: Because this visit was a virtual/telehealth visit, some criteria may be missing or patient reported. Any vitals not documented were not able to be obtained and vitals that have been documented are patient reported.  VideoDeclined- This patient declined Librarian, academic. Therefore the visit was completed with audio only.  Persons Participating in Visit: Patient.  AWV Questionnaire: No: Patient Medicare AWV questionnaire was not completed prior to this visit.  Cardiac Risk Factors include: advanced age (>68men, >19 women);dyslipidemia;hypertension     Objective:    Today's Vitals   12/18/23 1501  Weight: 146 lb (66.2 kg)  Height: 5' 3.5 (1.613 m)  PainSc: 6    Body mass index is 25.46 kg/m.     12/18/2023    3:10 PM 02/04/2020   12:56 PM 03/01/2019    8:53 AM 12/11/2018    5:00 PM 12/11/2018    9:47 AM 12/07/2018   10:59 AM  Advanced Directives  Does Patient Have a Medical Advance Directive? No No No No No No  Would patient like information on creating a medical advance directive? No - Patient declined No - Patient declined Yes (Inpatient - patient requests chaplain consult to create a medical advance directive);No - Patient declined   No - Patient declined  No - Patient declined  Yes (MAU/Ambulatory/Procedural Areas - Information given)      Data saved with a previous flowsheet row definition    Current Medications (verified) Outpatient Encounter Medications as of 12/18/2023  Medication Sig   alendronate  (FOSAMAX ) 70 MG tablet Take 1 tablet (70 mg total) by mouth once a week. Take with a full glass of water on an empty stomach.   aspirin  81 MG chewable tablet Chew by mouth daily.   atorvastatin  (LIPITOR) 40 MG tablet Take 1 tablet (40 mg total) by mouth daily.   buPROPion  (WELLBUTRIN  XL) 150 MG 24 hr tablet TAKE 1 TABLET BY MOUTH EVERY DAY   Cholecalciferol  (VITAMIN D ) 1000 UNITS capsule Take 1,000 Units by mouth daily.   CRANBERRY PO Take by mouth.   cyclobenzaprine (FLEXERIL) 10 MG tablet Take 10 mg by mouth 3 (three) times daily.   eletriptan  (RELPAX ) 20 MG tablet Take 1 tablet (20 mg total) by mouth as needed for migraine or headache. May repeat in 2 hours if headache persists or recurs.   Ferrous Sulfate (IRON PO) Take by mouth.   loperamide  (IMODIUM ) 2 MG capsule Take 1 capsule (2 mg total) by mouth 4 (four) times daily as needed (collagenous colitis).   ondansetron  (ZOFRAN -ODT) 4 MG disintegrating tablet    oxyCODONE -acetaminophen  (PERCOCET) 10-325 MG tablet Take 1 tablet by mouth 4 (four) times daily as needed for pain.   potassium chloride  SA (KLOR-CON  M) 20 MEQ tablet Take 1 tablet (20 mEq total) by mouth 2 (two) times daily.  venlafaxine  (EFFEXOR ) 75 MG tablet TAKE 1 TABLET BY MOUTH TWICE A DAY   verapamil  (CALAN -SR) 240 MG CR tablet Take 1 tablet (240 mg total) by mouth at bedtime.   vitamin B-12 (CYANOCOBALAMIN ) 1000 MCG tablet Take 1,000 mcg by mouth 3 (three) times a week. 3 times a week   No facility-administered encounter medications on file as of 12/18/2023.    Allergies (verified) Codeine sulfate, Moxifloxacin, Penicillins, Sulfonamide derivatives, Biaxin [clarithromycin], Diphenhydramine  hcl, and  Tramadol hcl   History: Past Medical History:  Diagnosis Date   Anxiety    Bladder stones    Bursitis of shoulder    Chiari malformation type I (HCC)    Chronic prescription benzodiazepine use 10/23/2018   COLLAGENOUS COLITIS 01/01/2008   DEPRESSIVE DISORDER 05/21/2010   Fibromyalgia    History of kidney stones    HYPERLIPIDEMIA 12/13/2008   HYPERTENSION 04/28/2007   LOW BACK PAIN 12/02/2007   Migraine    Migraine without aura and without status migrainosus, not intractable 06/19/2018   NEPHROLITHIASIS, HX OF 04/28/2007   PONV (postoperative nausea and vomiting)    Raynaud disease    Renal colic 02/12/2008   UTI'S, RECURRENT 04/28/2007   Past Surgical History:  Procedure Laterality Date   ABDOMINAL HYSTERECTOMY  1990   fiboids, prolapse   colonoscopy     ETHMOIDECTOMY Right 03/01/2019   Procedure: TOTAL ETHMOIDECTOMY;  Surgeon: Reynold Caves, MD;  Location: [facility];  Service: ENT;  Laterality: Right;   FRONTAL SINUS EXPLORATION Right 03/01/2019   Procedure: FRONTAL RECESS EXPLORATION;  Surgeon: Reynold Caves, MD;  Location: [facility];  Service: ENT;  Laterality: Right;   MRI  2021   cervical and throacic spine, demonstrates good decompression at the cervicomedullary junction   radiotherapy ablation N/A    back   SINUS ENDO WITH FUSION Right 03/01/2019   Procedure: SINUS ENDOSCOPY WITH FUSION NAVIGATION;  Surgeon: Reynold Caves, MD;  Location: [facility];  Service: ENT;  Laterality: Right;   SUBOCCIPITAL CRANIECTOMY CERVICAL LAMINECTOMY N/A 12/11/2018   Procedure: Chiari Decompression;  Surgeon: Augusto Blonder, MD;  Location: [facility];  Service: Neurosurgery;  Laterality: N/A;  posterior/ suboccipital   WISDOM TOOTH EXTRACTION     Family History  Problem Relation Age of Onset   Hyperlipidemia Mother    Hypertension Mother    Parkinson's disease Mother    Anxiety disorder Mother    Arthritis Mother    Alcohol abuse Father    Suicidality Father     Healthy Sister    Hyperlipidemia Brother    Arrhythmia Daughter    Hyperlipidemia Daughter    Polycystic ovary syndrome Daughter    Hypertension Brother    Hyperlipidemia Brother    Hyperlipidemia Brother    Hypertension Brother    Diabetes Neg Hx    Cancer Neg Hx    Social History   Socioeconomic History   Marital status: Married    Spouse name: disabled vet   Number of children: 2   Years of education: some college   Highest education level: Not on file  Occupational History   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   Right-handed.   No daily caffeine use.   Social Drivers of Corporate investment banker Strain: Low Risk  (12/18/2023)   Overall Financial Resource Strain (CARDIA)    Difficulty of Paying Living Expenses: Not hard at all  Food Insecurity: No Food Insecurity (12/18/2023)   Hunger Vital Sign    Worried About Running Out of Food in the Last Year: Never true    Ran Out of Food in the Last Year: Never true  Transportation Needs: No Transportation Needs (12/18/2023)   PRAPARE - Administrator, Civil Service (Medical): No    Lack of Transportation (Non-Medical): No  Physical Activity: Sufficiently Active (12/18/2023)   Exercise Vital Sign    Days of Exercise per Week: 5 days    Minutes of Exercise per Session: 30 min  Stress: Stress Concern Present (12/18/2023)   [employer] of Occupational Health - Occupational Stress Questionnaire    Feeling  of Stress: To some extent  Social Connections: Moderately Integrated (12/18/2023)   Social Connection and Isolation Panel    Frequency of Communication with Friends and Family: More than three times a week    Frequency of Social Gatherings with Friends and Family: Once a week    Attends Religious Services: 1 to 4 times per year    Active Member of [employer] or Organizations: No    Attends Engineer, structural: Never    Marital Status: Married    Tobacco Counseling Counseling given: Not Answered    Clinical Intake:  Pre-visit preparation completed: Yes  Pain : 0-10 Pain Score: 6  Pain Type: Chronic pain Pain Location: Back Pain Descriptors / Indicators: Aching, Burning, Stabbing Pain Onset: More than a month ago Pain Frequency: Constant     BMI - recorded: 25.46 Nutritional Status: BMI 25 -29 Overweight Diabetes: No  No results found for: HGBA1C   How often do you need to have someone help you when you read instructions, pamphlets, or other written materials from your doctor or pharmacy?: 1 - Never  Interpreter Needed?: No  Information entered by :: Lamont Pilsner, LPN   Activities of Daily Living     12/18/2023    3:03 PM  In your present state of health, do you have any difficulty performing the following activities:  Hearing? 1  Comment hearing aids  Vision? 0  Difficulty concentrating or making decisions? 0  Walking or climbing stairs? 1  Comment pain related  Dressing or bathing? 0  Doing errands, shopping? 0  Preparing Food and eating ? N  Using the Toilet? N  In the past six months, have you accidently leaked urine? N  Do you have problems with loss of bowel control? Y  Comment at times  Managing your Medications? N  Managing your Finances? N  Housekeeping or managing your Housekeeping? N    Patient Care Team: Luevenia Saha, MD as PCP - General (Family Medicine) Kenney Peacemaker, MD as Consulting Physician (Gastroenterology) Reinhold Carbine, MD as Referring Physician (Specialist) Reynold Caves, MD as Consulting Physician (Otolaryngology) Augusto Blonder, MD as Consulting Physician (Neurosurgery) Evangeline Hilts, MD (Pain Medicine)  I have updated your Care Teams any recent Medical Services you  may have received from other providers in the past year.     Assessment:   This is a routine wellness examination for Paula.  Hearing/Vision screen Hearing Screening - Comments:: Pt has hearing aids  Vision Screening - Comments:: Wears rx glasses - up to date with routine eye exams with my eye Dr in [facility]     Goals Addressed             This Visit's Progress    Patient Stated       Stay active       Depression Screen     12/18/2023    3:11 PM 11/27/2023   10:41 AM 11/26/2022   10:14 AM 05/28/2022    8:59 AM 07/18/2021    3:33 PM 01/15/2021    3:28 PM 10/11/2020    8:49 AM  PHQ 2/9 Scores  PHQ - 2 Score 0 1 1 1 6 3 4   PHQ- 9 Score 0 7 8 7 21 12 17     Fall Risk     12/18/2023    3:13 PM 11/27/2023   10:32 AM 11/26/2022   10:14 AM 05/28/2022    8:55 AM 10/11/2020    8:50 AM  Fall Risk   Falls in the past year? 1 0 1 0 1  Number falls in past yr: 1 0 1 0 1  Injury with Fall? 1 0 0 0 0  Comment bruised      Risk for fall due to : History of fall(s);Impaired balance/gait History of fall(s) No Fall Risks No Fall Risks History of fall(s)  Follow up Falls prevention discussed Falls evaluation completed Falls evaluation completed Falls evaluation completed       Data saved with a previous flowsheet row definition    MEDICARE RISK AT HOME:  Medicare Risk at Home Any stairs in or around the home?: Yes If so, are there any without handrails?: No Home free of loose throw rugs in walkways, pet beds, electrical cords, etc?: Yes Adequate lighting in your home to reduce risk of falls?: Yes Life alert?: No Use of a cane, walker or w/c?: No Grab bars in the bathroom?: Yes Shower chair or bench in shower?:  Yes Elevated toilet seat or a handicapped toilet?: Yes  TIMED UP AND GO:  Was the test performed?  No  Cognitive Function: 6CIT completed    05/18/2019    2:01 PM  MMSE - Mini Mental State Exam  Orientation to time 5  Orientation to Place 5  Registration 3  Attention/ Calculation 5  Recall 2  Language- name 2 objects 2  Language- repeat 1  Language- follow 3 step command 3  Language- read & follow direction 1  Write a sentence 1  Copy design 1  Total score 29        12/18/2023    3:14 PM  6CIT Screen  What Year? 0 points  What month? 0 points  What time? 0 points  Count back from 20 0 points  Months in reverse 0 points  Repeat phrase 0 points  Total Score 0 points    Immunizations Immunization History  Administered Date(s) Administered   Influenza Split 05/27/2012   Influenza Whole 03/15/2010   Influenza, High Dose Seasonal PF 04/29/2017   Influenza,inj,Quad PF,6+ Mos 06/02/2013, 05/24/2014, 05/09/2015, 05/28/2016, 06/19/2018, 03/31/2019, 07/18/2021, 05/28/2022   Moderna Sars-Covid-2 Vaccination 09/30/2019, 10/26/2019, 06/23/2020   PNEUMOCOCCAL CONJUGATE-20 07/18/2021   Td 12/13/2008   Tdap 09/20/2019   Zoster Recombinant(Shingrix)  03/27/2020, 10/11/2020    Screening Tests Health Maintenance  Topic Date Due   COVID-19 Vaccine (4 - 2024-25 season) 03/09/2023   MAMMOGRAM  11/29/2023   INFLUENZA VACCINE  02/06/2024   Medicare Annual Wellness (AWV)  12/17/2024   Fecal DNA (Cologuard)  06/10/2025   DEXA SCAN  07/31/2025   DTaP/Tdap/Td (3 - Td or Tdap) 09/19/2029   Pneumococcal Vaccine: 50+ Years  Completed   Hepatitis C Screening  Completed   Zoster Vaccines- Shingrix  Completed   HPV VACCINES  Aged Out   Meningococcal B Vaccine  Aged Out   Colonoscopy  Discontinued    Health Maintenance  Health Maintenance Due  Topic Date Due   COVID-19 Vaccine (4 - 2024-25 season) 03/09/2023   MAMMOGRAM  11/29/2023   Health Maintenance Items Addressed: See  Nurse Notes at the end of this note  Additional Screening:  Vision Screening: Recommended annual ophthalmology exams for early detection of glaucoma and other disorders of the eye. Would you like a referral to an eye doctor? No    Dental Screening: Recommended annual dental exams for proper oral hygiene  Community Resource Referral / Chronic Care Management: CRR required this visit?  No   CCM required this visit?  No   Plan:    I have personally reviewed and noted the following in the patient's chart:   Medical and social history Use of alcohol, tobacco or illicit drugs  Current medications and supplements including opioid prescriptions. Patient is currently taking opioid prescriptions. Information provided to patient regarding non-opioid alternatives. Patient advised to discuss non-opioid treatment plan with their provider. Functional ability and status Nutritional status Physical activity Advanced directives List of other physicians Hospitalizations, surgeries, and ER visits in previous 12 months Vitals Screenings to include cognitive, depression, and falls Referrals and appointments  In addition, I have reviewed and discussed with patient certain preventive protocols, quality metrics, and best practice recommendations. A written personalized care plan for preventive services as well as general preventive health recommendations were provided to patient.   Bruno Capri, LPN   1/61/0960   After Visit Summary: (MyChart) Due to this being a telephonic visit, the after visit summary with patients personalized plan was offered to patient via MyChart   Notes: Nothing significant to report at this time.

## 2023-12-18 NOTE — Patient Instructions (Signed)
 [name] , Thank you for taking time out of your busy schedule to complete your Annual Wellness Visit with me. I enjoyed our conversation and look forward to speaking with you again next year. I, as well as your care team,  appreciate your ongoing commitment to your health goals. Please review the following plan we discussed and let me know if I can assist you in the future. Your Game plan/ To Do List    Referrals: If you haven't heard from the office you've been referred to, please reach out to them at the phone provided.   Follow up Visits: Next Medicare AWV with our clinical staff: 12/23/24   Have you seen your provider in the last 6 months (3 months if uncontrolled diabetes)? Yes Next Office Visit with your provider: 11/2024  Clinician Recommendations:  Aim for 30 minutes of exercise or brisk walking, 6-8 glasses of water, and 5 servings of fruits and vegetables each day.       This is a list of the screening recommended for you and due dates:  Health Maintenance  Topic Date Due   Medicare Annual Wellness Visit  Never done   COVID-19 Vaccine (4 - 2024-25 season) 03/09/2023   Mammogram  11/29/2023   Flu Shot  02/06/2024   Cologuard (Stool DNA test)  06/10/2025   DEXA scan (bone density measurement)  07/31/2025   DTaP/Tdap/Td vaccine (3 - Td or Tdap) 09/19/2029   Pneumococcal Vaccine for age over 23  Completed   Hepatitis C Screening  Completed   Zoster (Shingles) Vaccine  Completed   HPV Vaccine  Aged Out   Meningitis B Vaccine  Aged Out   Colon Cancer Screening  Discontinued    Advanced directives: (Declined) Advance directive discussed with you today. Even though you declined this today, please call our office should you change your mind, and we can give you the proper paperwork for you to fill out. Advance Care Planning is important because it:  [x]  Makes sure you receive the medical care that is consistent with your values, goals, and preferences  [x]  It provides guidance  to your family and loved ones and reduces their decisional burden about whether or not they are making the right decisions based on your wishes.  Follow the link provided in your after visit summary or read over the paperwork we have mailed to you to help you started getting your Advance Directives in place. If you need assistance in completing these, please reach out to us  so that we can help you!  See attachments for Preventive Care and Fall Prevention Tips.

## 2023-12-25 DIAGNOSIS — M5414 Radiculopathy, thoracic region: Secondary | ICD-10-CM | POA: Diagnosis not present

## 2023-12-25 DIAGNOSIS — M47812 Spondylosis without myelopathy or radiculopathy, cervical region: Secondary | ICD-10-CM | POA: Diagnosis not present

## 2023-12-25 DIAGNOSIS — M47814 Spondylosis without myelopathy or radiculopathy, thoracic region: Secondary | ICD-10-CM | POA: Diagnosis not present

## 2023-12-25 DIAGNOSIS — M546 Pain in thoracic spine: Secondary | ICD-10-CM | POA: Diagnosis not present

## 2023-12-25 DIAGNOSIS — Z79891 Long term (current) use of opiate analgesic: Secondary | ICD-10-CM | POA: Diagnosis not present

## 2023-12-25 DIAGNOSIS — M542 Cervicalgia: Secondary | ICD-10-CM | POA: Diagnosis not present

## 2023-12-25 DIAGNOSIS — G894 Chronic pain syndrome: Secondary | ICD-10-CM | POA: Diagnosis not present

## 2023-12-30 DIAGNOSIS — M5414 Radiculopathy, thoracic region: Secondary | ICD-10-CM | POA: Diagnosis not present

## 2023-12-30 DIAGNOSIS — M4803 Spinal stenosis, cervicothoracic region: Secondary | ICD-10-CM | POA: Diagnosis not present

## 2023-12-30 DIAGNOSIS — M4804 Spinal stenosis, thoracic region: Secondary | ICD-10-CM | POA: Diagnosis not present

## 2024-01-27 DIAGNOSIS — M47814 Spondylosis without myelopathy or radiculopathy, thoracic region: Secondary | ICD-10-CM | POA: Diagnosis not present

## 2024-01-27 DIAGNOSIS — G894 Chronic pain syndrome: Secondary | ICD-10-CM | POA: Diagnosis not present

## 2024-01-27 DIAGNOSIS — M542 Cervicalgia: Secondary | ICD-10-CM | POA: Diagnosis not present

## 2024-01-27 DIAGNOSIS — M546 Pain in thoracic spine: Secondary | ICD-10-CM | POA: Diagnosis not present

## 2024-01-27 DIAGNOSIS — M5414 Radiculopathy, thoracic region: Secondary | ICD-10-CM | POA: Diagnosis not present

## 2024-01-27 DIAGNOSIS — Z79891 Long term (current) use of opiate analgesic: Secondary | ICD-10-CM | POA: Diagnosis not present

## 2024-01-27 DIAGNOSIS — M47812 Spondylosis without myelopathy or radiculopathy, cervical region: Secondary | ICD-10-CM | POA: Diagnosis not present

## 2024-02-26 DIAGNOSIS — M5414 Radiculopathy, thoracic region: Secondary | ICD-10-CM | POA: Diagnosis not present

## 2024-02-26 DIAGNOSIS — M47812 Spondylosis without myelopathy or radiculopathy, cervical region: Secondary | ICD-10-CM | POA: Diagnosis not present

## 2024-02-26 DIAGNOSIS — M542 Cervicalgia: Secondary | ICD-10-CM | POA: Diagnosis not present

## 2024-02-26 DIAGNOSIS — Z79891 Long term (current) use of opiate analgesic: Secondary | ICD-10-CM | POA: Diagnosis not present

## 2024-02-26 DIAGNOSIS — G894 Chronic pain syndrome: Secondary | ICD-10-CM | POA: Diagnosis not present

## 2024-02-26 DIAGNOSIS — M47814 Spondylosis without myelopathy or radiculopathy, thoracic region: Secondary | ICD-10-CM | POA: Diagnosis not present

## 2024-02-26 DIAGNOSIS — M546 Pain in thoracic spine: Secondary | ICD-10-CM | POA: Diagnosis not present

## 2024-03-25 DIAGNOSIS — M79604 Pain in right leg: Secondary | ICD-10-CM | POA: Diagnosis not present

## 2024-03-25 DIAGNOSIS — G894 Chronic pain syndrome: Secondary | ICD-10-CM | POA: Diagnosis not present

## 2024-03-25 DIAGNOSIS — M47814 Spondylosis without myelopathy or radiculopathy, thoracic region: Secondary | ICD-10-CM | POA: Diagnosis not present

## 2024-03-25 DIAGNOSIS — Z79891 Long term (current) use of opiate analgesic: Secondary | ICD-10-CM | POA: Diagnosis not present

## 2024-03-25 DIAGNOSIS — M5416 Radiculopathy, lumbar region: Secondary | ICD-10-CM | POA: Diagnosis not present

## 2024-03-25 DIAGNOSIS — M546 Pain in thoracic spine: Secondary | ICD-10-CM | POA: Diagnosis not present

## 2024-03-25 DIAGNOSIS — M79605 Pain in left leg: Secondary | ICD-10-CM | POA: Diagnosis not present

## 2024-04-21 ENCOUNTER — Other Ambulatory Visit: Payer: Self-pay | Admitting: Family Medicine

## 2024-04-22 DIAGNOSIS — G894 Chronic pain syndrome: Secondary | ICD-10-CM | POA: Diagnosis not present

## 2024-04-22 DIAGNOSIS — M79604 Pain in right leg: Secondary | ICD-10-CM | POA: Diagnosis not present

## 2024-04-22 DIAGNOSIS — M79605 Pain in left leg: Secondary | ICD-10-CM | POA: Diagnosis not present

## 2024-04-22 DIAGNOSIS — M47814 Spondylosis without myelopathy or radiculopathy, thoracic region: Secondary | ICD-10-CM | POA: Diagnosis not present

## 2024-04-22 DIAGNOSIS — Z79891 Long term (current) use of opiate analgesic: Secondary | ICD-10-CM | POA: Diagnosis not present

## 2024-04-22 DIAGNOSIS — M546 Pain in thoracic spine: Secondary | ICD-10-CM | POA: Diagnosis not present

## 2024-04-22 DIAGNOSIS — M5416 Radiculopathy, lumbar region: Secondary | ICD-10-CM | POA: Diagnosis not present

## 2024-04-27 DIAGNOSIS — H2513 Age-related nuclear cataract, bilateral: Secondary | ICD-10-CM | POA: Diagnosis not present

## 2024-04-27 DIAGNOSIS — H40053 Ocular hypertension, bilateral: Secondary | ICD-10-CM | POA: Diagnosis not present

## 2024-04-29 DIAGNOSIS — M47814 Spondylosis without myelopathy or radiculopathy, thoracic region: Secondary | ICD-10-CM | POA: Diagnosis not present

## 2024-04-30 DIAGNOSIS — H40003 Preglaucoma, unspecified, bilateral: Secondary | ICD-10-CM | POA: Diagnosis not present

## 2024-04-30 DIAGNOSIS — H43393 Other vitreous opacities, bilateral: Secondary | ICD-10-CM | POA: Diagnosis not present

## 2024-04-30 DIAGNOSIS — H2513 Age-related nuclear cataract, bilateral: Secondary | ICD-10-CM | POA: Diagnosis not present

## 2024-04-30 DIAGNOSIS — H40053 Ocular hypertension, bilateral: Secondary | ICD-10-CM | POA: Diagnosis not present

## 2024-04-30 DIAGNOSIS — H43813 Vitreous degeneration, bilateral: Secondary | ICD-10-CM | POA: Diagnosis not present

## 2024-05-20 DIAGNOSIS — Z79891 Long term (current) use of opiate analgesic: Secondary | ICD-10-CM | POA: Diagnosis not present

## 2024-05-20 DIAGNOSIS — G894 Chronic pain syndrome: Secondary | ICD-10-CM | POA: Diagnosis not present

## 2024-05-20 DIAGNOSIS — M546 Pain in thoracic spine: Secondary | ICD-10-CM | POA: Diagnosis not present

## 2024-05-20 DIAGNOSIS — M47814 Spondylosis without myelopathy or radiculopathy, thoracic region: Secondary | ICD-10-CM | POA: Diagnosis not present

## 2024-05-31 NOTE — H&P (Signed)
 Surgical History & Physical  Patient Name: [name]  DOB: 08-22-1955  Surgery: Cataract extraction with intraocular lens implant phacoemulsification; Right Eye Surgeon: Marsa Cleverly MD Surgery Date: 06/08/2024 Pre-Op Date: 04/30/2024  HPI: A 89 Yr. old female patient 1. The patient complains of difficulty when reading fine print, books, newspaper, instructions etc., which began 6 months ago. Both eyes are affected. The episode is gradual. The patient describes foggy and hazy symptoms affecting their eyes/vision. HPI was performed by Marsa Cleverly .  Medical History: Dry Eyes Glaucoma Cataracts  High Blood Pressure LDL  Review of Systems Cardiovascular High Blood Pressure Eyes Blurred Vision Psychiatry Anxiety, Depression All recorded systems are negative except as noted above.  Social Former smoker  Alcohol Never  Medication Prednisolone-moxiflox-bromfen,  Venlafaxine , Alendronate , Oxycodone -acetaminophen , Atorvastatin , Potassium chloride , Ondansetron , Verapamil , Bupropion  HCl  Sx/Procedures Chiari Sx, Hysterectomy, Kidney Stone Removal, Leproscopy  Drug Allergies  NKDA  History & Physical: Heent: cataracts NECK: supple without bruits LUNGS: lungs clear to auscultation CV: regular rate and rhythm Abdomen: soft and non-tender  Impression & Plan: Assessment: 1.  CATARACT NUCLEAR SCLEROSIS AGE RELATED; Both Eyes (H25.13) 2.  Ocular Hypertension; Both Eyes (H40.053) 3.  GLAUCOMA SUSPECT; Both Eyes (H40.003) 4.  Posterior Vitreous Detachment; Both Eyes (H43.813) 5.  VITREOUS FLOATERS; Both Eyes (H43.393)  Plan: 1.  Cataracts are visually significant and account for the patient's complaints. Discussed all risks, benefits, procedures and recovery, including infection, loss of vision and eye, need for glasses after surgery or additional procedures. Patient understands changing glasses will not improve vision. Patient indicated understanding of procedure. All  questions answered. Patient desires to have surgery, recommend phacoemulsification with intraocular lens. Patient to have preliminary testing necessary (Argos/IOL Master, Mac OCT, TOPO) Educational materials provided:Cataract.  Plan: - Proceed with cataract surgery OD followed by OS - Plan for best distance target with DIU toric OD and plan for -1.50 target OS - No DM, no fuchs, no prior eye surgery - good dilation - Dextenza  if available  2.  Borderline elevated IOP. OCT and optic nerve head also overall reassuring. Will complete HVF after CE  3.  AA  4.  No retinal tear or retinal detachment. I told the patient to contact me ASAP for new onset/worsening of floaters, flashes, or vision loss.  5.  As above

## 2024-06-01 ENCOUNTER — Encounter (HOSPITAL_COMMUNITY): Payer: Self-pay

## 2024-06-01 ENCOUNTER — Encounter (HOSPITAL_COMMUNITY)
Admission: RE | Admit: 2024-06-01 | Discharge: 2024-06-01 | Disposition: A | Discharge status: Home / Self Care | Source: Ambulatory Visit | Attending: Optometry | Admitting: Optometry

## 2024-06-02 DIAGNOSIS — H2511 Age-related nuclear cataract, right eye: Secondary | ICD-10-CM | POA: Diagnosis not present

## 2024-06-08 ENCOUNTER — Ambulatory Visit (HOSPITAL_COMMUNITY)

## 2024-06-08 ENCOUNTER — Encounter (HOSPITAL_COMMUNITY): Payer: Self-pay | Admitting: Optometry

## 2024-06-08 ENCOUNTER — Other Ambulatory Visit: Payer: Self-pay

## 2024-06-08 ENCOUNTER — Ambulatory Visit (HOSPITAL_COMMUNITY)
Admission: RE | Admit: 2024-06-08 | Discharge: 2024-06-08 | Disposition: A | Discharge status: Home / Self Care | Attending: Optometry | Admitting: Optometry

## 2024-06-08 ENCOUNTER — Encounter (HOSPITAL_COMMUNITY)
Admission: RE | Disposition: A | Discharge status: Home / Self Care | Payer: Self-pay | Source: Home / Self Care | Attending: Optometry

## 2024-06-08 DIAGNOSIS — Z87891 Personal history of nicotine dependence: Secondary | ICD-10-CM

## 2024-06-08 DIAGNOSIS — H2511 Age-related nuclear cataract, right eye: Secondary | ICD-10-CM

## 2024-06-08 DIAGNOSIS — I129 Hypertensive chronic kidney disease with stage 1 through stage 4 chronic kidney disease, or unspecified chronic kidney disease: Secondary | ICD-10-CM

## 2024-06-08 DIAGNOSIS — F419 Anxiety disorder, unspecified: Secondary | ICD-10-CM | POA: Diagnosis not present

## 2024-06-08 DIAGNOSIS — N183 Chronic kidney disease, stage 3 unspecified: Secondary | ICD-10-CM | POA: Diagnosis not present

## 2024-06-08 HISTORY — PX: CATARACT EXTRACTION W/PHACO: SHX586

## 2024-06-08 SURGERY — PHACOEMULSIFICATION, CATARACT, WITH IOL INSERTION
Anesthesia: Monitor Anesthesia Care | Site: Eye | Laterality: Right

## 2024-06-08 MED ORDER — POVIDONE-IODINE 5 % OP SOLN
OPHTHALMIC | Status: DC | PRN
  Administered 2024-06-08: 1 via OPHTHALMIC

## 2024-06-08 MED ORDER — LIDOCAINE HCL (PF) 1 % IJ SOLN
INTRAMUSCULAR | Status: DC | PRN
  Administered 2024-06-08: 1 mL

## 2024-06-08 MED ORDER — MIDAZOLAM HCL 2 MG/2ML IJ SOLN
INTRAMUSCULAR | Status: AC
  Filled 2024-06-08: qty 2

## 2024-06-08 MED ORDER — PHENYLEPHRINE-KETOROLAC 1-0.3 % IO SOLN
INTRAOCULAR | Status: DC | PRN
  Administered 2024-06-08: 500 mL via OPHTHALMIC

## 2024-06-08 MED ORDER — LIDOCAINE HCL 3.5 % OP GEL
1.0000 | Freq: Once | OPHTHALMIC | Status: AC
  Administered 2024-06-08: 1 via OPHTHALMIC

## 2024-06-08 MED ORDER — TROPICAMIDE 1 % OP SOLN
1.0000 [drp] | OPHTHALMIC | Status: AC
  Administered 2024-06-08 (×3): 1 [drp] via OPHTHALMIC

## 2024-06-08 MED ORDER — BSS IO SOLN
INTRAOCULAR | Status: DC | PRN
  Administered 2024-06-08: 15 mL via INTRAOCULAR

## 2024-06-08 MED ORDER — PHENYLEPHRINE HCL 2.5 % OP SOLN
1.0000 [drp] | OPHTHALMIC | Status: AC
  Administered 2024-06-08 (×3): 1 [drp] via OPHTHALMIC

## 2024-06-08 MED ORDER — SODIUM CHLORIDE 0.9% FLUSH
INTRAVENOUS | Status: DC | PRN
  Administered 2024-06-08: 10 mL via INTRAVENOUS

## 2024-06-08 MED ORDER — MIDAZOLAM HCL (PF) 2 MG/2ML IJ SOLN
INTRAMUSCULAR | Status: DC | PRN
  Administered 2024-06-08: 2 mg via INTRAVENOUS

## 2024-06-08 MED ORDER — SIGHTPATH DOSE#1 NA HYALUR & NA CHOND-NA HYALUR IO KIT
PACK | INTRAOCULAR | Status: DC | PRN
  Administered 2024-06-08: 1 via OPHTHALMIC

## 2024-06-08 MED ORDER — STERILE WATER FOR IRRIGATION IR SOLN
Status: DC | PRN
  Administered 2024-06-08: 1

## 2024-06-08 MED ORDER — TETRACAINE HCL 0.5 % OP SOLN
1.0000 [drp] | OPHTHALMIC | Status: AC
  Administered 2024-06-08 (×3): 1 [drp] via OPHTHALMIC

## 2024-06-08 SURGICAL SUPPLY — 11 items
CLOTH BEACON ORANGE TIMEOUT ST (SAFETY) ×1 IMPLANT
DRSG TEGADERM 4X4.75 (GAUZE/BANDAGES/DRESSINGS) ×1 IMPLANT
EYE SHIELD UNIVERSAL CLEAR (GAUZE/BANDAGES/DRESSINGS) IMPLANT
FEE CATARACT SUITE SIGHTPATH (MISCELLANEOUS) ×1 IMPLANT
GLOVE BIOGEL PI IND STRL 7.0 (GLOVE) ×2 IMPLANT
LENS IOL EYHANCE TRC 225 14.0 IMPLANT
NDL HYPO 18GX1.5 BLUNT FILL (NEEDLE) ×1 IMPLANT
PAD ARMBOARD POSITIONER FOAM (MISCELLANEOUS) ×1 IMPLANT
SYR TB 1ML LL NO SAFETY (SYRINGE) ×1 IMPLANT
TAPE SURG TRANSPORE 1 IN (GAUZE/BANDAGES/DRESSINGS) IMPLANT
WATER STERILE IRR 250ML POUR (IV SOLUTION) ×1 IMPLANT

## 2024-06-08 NOTE — Op Note (Signed)
 Date of procedure: 06/08/24  Pre-operative diagnosis: Visually significant age-related cataract, Right Eye; Visually Significant Astigmatism, Right Eye (H25.221)  Post-operative diagnosis: Visually significant age-related cataract, Right Eye; Visually Significant Astigmatism, Right Eye, Ocular Pain and Inflammation (H57.11)  Procedure: Removal of cataract via phacoemulsification and insertion of intra-ocular lens [employer] [id number] +14.0D into the capsular bag of the Right Eye  Attending surgeon: Marsa PARAS. Juli, MD  Anesthesia: MAC, Topical Akten  Complications: None  Estimated Blood Loss: <28mL (minimal)  Specimens: None  Implants:  Implant Name Type Inv. Item Serial No. Manufacturer Lot No. LRB No. Used Action  LENS IOL EYHANCE TRC 225 14.0 - [id number]  LENS IOL EYHANCE TRC 225 14.0 [id number] [employer]IGHTPATH  Right 1 Implanted    Indications:  Visually significant age-related cataract, Right Eye; Visually Significant Astigmatism, Right Eye  Procedure:  The patient was seen and identified in the pre-operative area. The operative eye was identified and dilated.  The operative eye was marked.  Pre-operative toric markers were used to mark the eye at 0 and 180 degrees. Topical anesthesia was administered to the operative eye.  The patient was then to the operative suite and placed in the supine position.  A timeout was performed confirming the patient, procedure to be performed, and all other relevant information.   The patient's face was prepped and draped in the usual fashion for intra-ocular surgery.  A lid speculum was placed into the operative eye and the surgical microscope moved into place and focused.  An inferotemporal paracentesis was created using a 20 gauge paracentesis blade.  BSS mixed with Omidria, followed by 1% lidocaine  was injected into the anterior chamber.  Viscoelastic was injected into the anterior chamber.  A temporal clear-corneal main wound incision was  created using a 2.62mm microkeratome.  A continuous curvilinear capsulorrhexis was initiated using an irrigating cystitome and completed using capsulorrhexis forceps.  Hydrodissection and hydrodeliniation were performed.  Viscoelastic was injected into the anterior chamber.  A phacoemulsification handpiece and a chopper as a second instrument were used to remove the nucleus and epinucleus. The irrigation/aspiration handpiece was used to remove any remaining cortical material.   The capsular bag was reinflated with viscoelastic, checked, and found to be intact.  The intraocular lens was inserted into the capsular bag.  The irrigation/aspiration handpiece was used to remove any remaining viscoelastic.  The clear corneal wound and paracentesis wounds were then hydrated and checked with Weck-Cels to be watertight.  The lid-speculum and drape was removed, and the patient's face was cleaned with a wet and dry 4x4.  Moxifloxacin was instilled onto the eye. A clear shield was taped over the eye. The patient was taken to the post-operative care unit in good condition, having tolerated the procedure well.   The capsular bag was reinflated with viscoelastic, checked, and found to be intact.  The eye was marked to the established meridian of 163 degrees.  The intraocular lens was inserted into the capsular bag and dialed into place at 163 degrees.  The irrigation/aspiration handpiece was used to remove any remaining viscoelastic.  The clear corneal wound and paracentesis wounds were then hydrated and checked with Weck-Cels to be watertight. The lid-speculum and drape were removed.  The patient's face was cleaned with a wet and dry 4x4.   A clear shield was taped over the eye. The patient was taken to the post-operative care unit in good condition, having tolerated the procedure well.   Post-Op Instructions: The patient will follow up at  [facility] for a same day post-operative evaluation and will receive all  other orders and instructions.

## 2024-06-08 NOTE — Anesthesia Postprocedure Evaluation (Signed)
 Anesthesia Post Note  Patient: [name]  Procedure(s) Performed: PHACOEMULSIFICATION, CATARACT, WITH IOL INSERTION (Right: Eye)  Patient location during evaluation: PACU Anesthesia Type: MAC Level of consciousness: awake and alert Pain management: pain level controlled Vital Signs Assessment: post-procedure vital signs reviewed and stable Respiratory status: spontaneous breathing, nonlabored ventilation, respiratory function stable and patient connected to nasal cannula oxygen Cardiovascular status: stable and blood pressure returned to baseline Postop Assessment: no apparent nausea or vomiting Anesthetic complications: no   No notable events documented.   Last Vitals:  Vitals:   06/08/24 0716 06/08/24 0851  BP: 124/68 122/71  Pulse: 79 79  Resp: 17 18  Temp: 36.8 C 37 C  SpO2: 97% 97%    Last Pain:  Vitals:   06/08/24 0851  TempSrc: Oral  PainSc:                  Andrea Limes

## 2024-06-08 NOTE — Discharge Instructions (Signed)
 Please discharge patient when stable, will follow up today with Dr. Ilsa Iha at the [facility] office immediately following discharge.  Leave shield in place until visit.  All paperwork with discharge instructions will be given at the office.  [facility] Address:  [address]  [place], [place] [address]  Dr. Chaya Jan Phone: [phone]

## 2024-06-08 NOTE — Anesthesia Preprocedure Evaluation (Signed)
 Anesthesia Evaluation  Patient identified by MRN, date of birth, ID band Patient awake    Reviewed: Allergy & Precautions, H&P , NPO status , Patient's Chart, lab work & pertinent test results  History of Anesthesia Complications (+) PONV and history of anesthetic complications  Airway Mallampati: II  TM Distance: >3 FB Neck ROM: Full    Dental no notable dental hx.    Pulmonary neg pulmonary ROS, former smoker   Pulmonary exam normal breath sounds clear to auscultation       Cardiovascular hypertension, Normal cardiovascular exam Rhythm:Regular Rate:Normal     Neuro/Psych  Headaches PSYCHIATRIC DISORDERS Anxiety Depression     Neuromuscular disease    GI/Hepatic negative GI ROS, Neg liver ROS,,,  Endo/Other  negative endocrine ROS    Renal/GU Renal diseaseChronic hypokalemia  negative genitourinary   Musculoskeletal  (+)  Fibromyalgia -  Abdominal   Peds negative pediatric ROS (+)  Hematology negative hematology ROS (+)   Anesthesia Other Findings   Reproductive/Obstetrics negative OB ROS                              Anesthesia Physical Anesthesia Plan  ASA: 2  Anesthesia Plan: MAC   Post-op Pain Management:    Induction:   PONV Risk Score and Plan:   Airway Management Planned: Nasal Cannula  Additional Equipment:   Intra-op Plan:   Post-operative Plan:   Informed Consent: I have reviewed the patients History and Physical, chart, labs and discussed the procedure including the risks, benefits and alternatives for the proposed anesthesia with the patient or authorized representative who has indicated his/her understanding and acceptance.     Dental advisory given  Plan Discussed with: CRNA  Anesthesia Plan Comments:         Anesthesia Quick Evaluation

## 2024-06-08 NOTE — Interval H&P Note (Signed)
 History and Physical Interval Note:  06/08/2024 7:48 AM  [name]  has presented today for surgery, with the diagnosis of nuclear sclerotic cataract, right eye.  The various methods of treatment have been discussed with the patient and family. After consideration of risks, benefits and other options for treatment, the patient has consented to  Procedure(s): PHACOEMULSIFICATION, CATARACT, WITH IOL INSERTION (Right) as a surgical intervention.  The patient's history has been reviewed, patient examined, no change in status, stable for surgery.  I have reviewed the patient's chart and labs.  Questions were answered to the patient's satisfaction.    The H and P was reviewed and updated. The patient was examined.  No changes were found after exam.  The surgical eye was marked.    Havilah Topor

## 2024-06-08 NOTE — Transfer of Care (Signed)
 Immediate Anesthesia Transfer of Care Note  Patient: [name]  Procedure(s) Performed: PHACOEMULSIFICATION, CATARACT, WITH IOL INSERTION (Right: Eye)  Patient Location: Short Stay  Anesthesia Type:MAC  Level of Consciousness: awake, alert , and oriented  Airway & Oxygen Therapy: Patient Spontanous Breathing  Post-op Assessment: Report given to RN and Post -op Vital signs reviewed and stable  Post vital signs: Reviewed and stable  Last Vitals:  Vitals Value Taken Time  BP 122/71   Temp    Pulse 80   Resp 14   SpO2 98%     Last Pain:  Vitals:   06/08/24 0716  TempSrc: Oral  PainSc: 5       Patients Stated Pain Goal: 5 (06/08/24 0716)  Complications: No notable events documented.

## 2024-06-09 ENCOUNTER — Encounter (HOSPITAL_COMMUNITY): Payer: Self-pay | Admitting: Optometry

## 2024-06-17 DIAGNOSIS — M5414 Radiculopathy, thoracic region: Secondary | ICD-10-CM | POA: Diagnosis not present

## 2024-06-24 DIAGNOSIS — G894 Chronic pain syndrome: Secondary | ICD-10-CM | POA: Diagnosis not present

## 2024-06-24 DIAGNOSIS — M546 Pain in thoracic spine: Secondary | ICD-10-CM | POA: Diagnosis not present

## 2024-06-24 DIAGNOSIS — M47814 Spondylosis without myelopathy or radiculopathy, thoracic region: Secondary | ICD-10-CM | POA: Diagnosis not present

## 2024-06-24 DIAGNOSIS — Z79891 Long term (current) use of opiate analgesic: Secondary | ICD-10-CM | POA: Diagnosis not present

## 2024-06-28 NOTE — H&P (Signed)
 Surgical History & Physical  Patient Name: [name]  DOB: June 23, 1956  Surgery: Cataract extraction with intraocular lens implant phacoemulsification; Left Eye Surgeon: Marsa Cleverly MD Surgery Date: 07/06/2024 Pre-Op Date: 06/16/2024  HPI: A 87 Yr. old female patient 1. The patient is returning after cataract surgery. The right eye is affected. Status post cataract surgery, which began 8 days ago: Since the last visit, the affected area feels improvement. The patient's vision is improved. The condition's severity is constant. Patient is following medication instructions. 2. The patient is returning for a cataract follow-up of the left eye. Since the last visit, the affected area is tolerating. The patient's vision is blurry. The condition's severity is constant. Patient is not taking medications. This is negatively affecting the patient's quality of life and the patient is unable to function adequately in life with the current level of vision. HPI was performed by Marsa Cleverly .  Medical History: Dry Eyes Glaucoma Cataracts  High Blood Pressure LDL  Review of Systems Cardiovascular High Blood Pressure Eyes Blurred Vision Psychiatry Anxiety, Depression All recorded systems are negative except as noted above.  Social Former smoker Alcohol Never  Medication Prednisolone-moxiflox-bromfen,  Venlafaxine , Alendronate , Oxycodone -acetaminophen , Atorvastatin , Potassium chloride , Ondansetron , Verapamil , Bupropion  HCl  Sx/Procedures Phaco c IOL OD,  Chiari Sx, Hysterectomy, Kidney Stone Removal, Leproscopy  Drug Allergies  Tramadol, Biaxin, Sulfa (Sulfonamide Antibiotics), Codeine, Moxifloxacin, Penicillin  History & Physical: Heent: cataract NECK: supple without bruits LUNGS: lungs clear to auscultation CV: regular rate and rhythm Abdomen: soft and non-tender  Impression & Plan: Assessment: 1.  CATARACT EXTRACTION STATUS; Right Eye (Z98.41) 2.  INTRAOCULAR LENS IOL  (Z96.1) 3.  CATARACT NUCLEAR SCLEROSIS AGE RELATED; Both Eyes (H25.13)  Plan: 1.  CE/PCIOL OD with toric distance target, IOL at 163 degrees  POD#8 Continue with eye drops as directed. Reviewed all post-op precautions. Call with increased redness, swelling, pain or loss of vision. Avoid swimming pools and hot tubs for 1 week.  2.  Doing well since surgery  3.  Cataracts are visually significant and account for the patient's complaints. Discussed all risks, benefits, procedures and recovery, including infection, loss of vision and eye, need for glasses after surgery or additional procedures. Patient understands changing glasses will not improve vision. Patient indicated understanding of procedure. All questions answered. Patient desires to have surgery, recommend phacoemulsification with intraocular lens. Patient to have preliminary testing necessary ([employer]/IOL Master, Mac OCT, TOPO) Educational materials provided:Cataract.  Plan: - Proceed with cataract surgery OS when ready - Plan for best distance target with DIU toric OD and plan for -1.50 target OS - No DM, no fuchs, no prior eye surgery - good dilation - Dextenza  if available

## 2024-06-29 ENCOUNTER — Encounter (HOSPITAL_COMMUNITY)
Admission: RE | Admit: 2024-06-29 | Discharge: 2024-06-29 | Disposition: A | Discharge status: Home / Self Care | Source: Ambulatory Visit | Attending: Optometry | Admitting: Optometry

## 2024-06-29 ENCOUNTER — Encounter (HOSPITAL_COMMUNITY): Payer: Self-pay

## 2024-07-06 ENCOUNTER — Encounter (HOSPITAL_COMMUNITY): Payer: Self-pay | Admitting: Optometry

## 2024-07-06 ENCOUNTER — Ambulatory Visit (HOSPITAL_COMMUNITY)

## 2024-07-06 ENCOUNTER — Encounter (HOSPITAL_COMMUNITY)
Admission: RE | Disposition: A | Discharge status: Home / Self Care | Payer: Self-pay | Source: Home / Self Care | Attending: Optometry

## 2024-07-06 ENCOUNTER — Ambulatory Visit (HOSPITAL_COMMUNITY)
Admission: RE | Admit: 2024-07-06 | Discharge: 2024-07-06 | Disposition: A | Discharge status: Home / Self Care | Attending: Optometry | Admitting: Optometry

## 2024-07-06 DIAGNOSIS — H2512 Age-related nuclear cataract, left eye: Secondary | ICD-10-CM | POA: Insufficient documentation

## 2024-07-06 DIAGNOSIS — I10 Essential (primary) hypertension: Secondary | ICD-10-CM

## 2024-07-06 DIAGNOSIS — Z87891 Personal history of nicotine dependence: Secondary | ICD-10-CM | POA: Insufficient documentation

## 2024-07-06 DIAGNOSIS — M797 Fibromyalgia: Secondary | ICD-10-CM | POA: Insufficient documentation

## 2024-07-06 DIAGNOSIS — F32A Depression, unspecified: Secondary | ICD-10-CM | POA: Insufficient documentation

## 2024-07-06 DIAGNOSIS — F418 Other specified anxiety disorders: Secondary | ICD-10-CM

## 2024-07-06 DIAGNOSIS — F419 Anxiety disorder, unspecified: Secondary | ICD-10-CM | POA: Diagnosis not present

## 2024-07-06 HISTORY — PX: CATARACT EXTRACTION W/PHACO: SHX586

## 2024-07-06 SURGERY — PHACOEMULSIFICATION, CATARACT, WITH IOL INSERTION
Anesthesia: Monitor Anesthesia Care | Site: Eye | Laterality: Left

## 2024-07-06 MED ORDER — LACTATED RINGERS IV SOLN: INTRAVENOUS | Status: DC

## 2024-07-06 MED ORDER — MIDAZOLAM HCL 2 MG/2ML IJ SOLN
INTRAMUSCULAR | Status: AC
  Filled 2024-07-06: qty 2

## 2024-07-06 MED ORDER — MIDAZOLAM HCL 5 MG/5ML IJ SOLN
INTRAMUSCULAR | Status: DC | PRN
  Administered 2024-07-06: 2 mg via INTRAVENOUS

## 2024-07-06 MED ORDER — TROPICAMIDE 1 % OP SOLN
1.0000 [drp] | OPHTHALMIC | Status: AC | PRN
  Administered 2024-07-06 (×3): 1 [drp] via OPHTHALMIC

## 2024-07-06 MED ORDER — PHENYLEPHRINE-KETOROLAC 1-0.3 % IO SOLN
INTRAOCULAR | Status: DC | PRN
  Administered 2024-07-06: 500 mL via OPHTHALMIC

## 2024-07-06 MED ORDER — LIDOCAINE HCL (PF) 1 % IJ SOLN
INTRAMUSCULAR | Status: DC | PRN
  Administered 2024-07-06: 2 mL

## 2024-07-06 MED ORDER — POVIDONE-IODINE 5 % OP SOLN
OPHTHALMIC | Status: DC | PRN
  Administered 2024-07-06: 1 via OPHTHALMIC

## 2024-07-06 MED ORDER — SODIUM CHLORIDE 0.9% FLUSH
INTRAVENOUS | Status: DC | PRN
  Administered 2024-07-06: 6 mL via INTRAVENOUS

## 2024-07-06 MED ORDER — TETRACAINE HCL 0.5 % OP SOLN
1.0000 [drp] | OPHTHALMIC | Status: AC | PRN
  Administered 2024-07-06 (×3): 1 [drp] via OPHTHALMIC

## 2024-07-06 MED ORDER — SIGHTPATH DOSE#1 NA HYALUR & NA CHOND-NA HYALUR IO KIT
PACK | INTRAOCULAR | Status: DC | PRN
  Administered 2024-07-06: 1 via OPHTHALMIC

## 2024-07-06 MED ORDER — BSS IO SOLN
INTRAOCULAR | Status: DC | PRN
  Administered 2024-07-06: 15 mL via INTRAOCULAR

## 2024-07-06 MED ORDER — STERILE WATER FOR IRRIGATION IR SOLN
Status: DC | PRN
  Administered 2024-07-06: 250 mL

## 2024-07-06 MED ORDER — PHENYLEPHRINE HCL 2.5 % OP SOLN
1.0000 [drp] | OPHTHALMIC | Status: AC | PRN
  Administered 2024-07-06 (×3): 1 [drp] via OPHTHALMIC

## 2024-07-06 MED ORDER — LIDOCAINE HCL 3.5 % OP GEL: 1.0000 | Freq: Once | OPHTHALMIC | Status: DC

## 2024-07-06 SURGICAL SUPPLY — 11 items
CLOTH BEACON ORANGE TIMEOUT ST (SAFETY) ×1 IMPLANT
DRSG TEGADERM 4X4.75 (GAUZE/BANDAGES/DRESSINGS) ×1 IMPLANT
EYE SHIELD UNIVERSAL CLEAR (GAUZE/BANDAGES/DRESSINGS) IMPLANT
FEE CATARACT SUITE SIGHTPATH (MISCELLANEOUS) ×1 IMPLANT
GLOVE BIOGEL PI IND STRL 7.0 (GLOVE) ×2 IMPLANT
LENS IOL TECNIS EYHANCE 17.5 (Intraocular Lens) IMPLANT
NEEDLE HYPO 18GX1.5 BLUNT FILL (NEEDLE) ×1 IMPLANT
PAD ARMBOARD POSITIONER FOAM (MISCELLANEOUS) ×1 IMPLANT
SYR TB 1ML LL NO SAFETY (SYRINGE) ×1 IMPLANT
TAPE SURG TRANSPORE 1 IN (GAUZE/BANDAGES/DRESSINGS) IMPLANT
WATER STERILE IRR 250ML POUR (IV SOLUTION) ×1 IMPLANT

## 2024-07-06 NOTE — Op Note (Signed)
 Date of procedure: 07/06/2024  Pre-operative diagnosis: Visually significant age-related nuclear cataract, Left Eye (H25.12)  Post-operative diagnosis: Visually significant age-related nuclear cataract, Left Eye H25.12  Procedure: Removal of cataract via phacoemulsification and insertion of intra-ocular lens J&J DIB00 +17.5D into the capsular bag of the Left Eye  Attending surgeon: Marsa JINNY Cleverly, MD  Anesthesia: MAC, Topical Akten   Complications: None  Estimated Blood Loss: <2mL (minimal)  Specimens: None  Implants:  Implant Name Type Inv. Item Serial No. Manufacturer Lot No. LRB No. Used Action  LENS IOL TECNIS EYHANCE 17.5 - [id number] Intraocular Lens LENS IOL TECNIS EYHANCE 17.5 [id number] [employer]  Left 1 Implanted    Indications:  Visually significant age-related cataract, Left Eye  Procedure:  The patient was seen and identified in the pre-operative area. The operative eye was identified and dilated.  The operative eye was marked.  Topical anesthesia was administered to the operative eye.     The patient was then to the operative suite and placed in the supine position.  A timeout was performed confirming the patient, procedure to be performed, and all other relevant information.   The patient's face was prepped and draped in the usual fashion for intra-ocular surgery.  A lid speculum was placed into the operative eye and the surgical microscope moved into place and focused.  An inferotemporal paracentesis was created using a 20 gauge paracentesis blade.  BSS mixed with Omidria , followed by 1% lidocaine  was injected into the anterior chamber.  Viscoelastic was injected into the anterior chamber.  A temporal clear-corneal main wound incision was created using a 2.71mm microkeratome.  A continuous curvilinear capsulorrhexis was initiated using an irrigating cystitome and completed using capsulorrhexis forceps.  Hydrodissection and hydrodeliniation were performed.  Viscoelastic  was injected into the anterior chamber.  A phacoemulsification handpiece and a chopper as a second instrument were used to remove the nucleus and epinucleus. The irrigation/aspiration handpiece was used to remove any remaining cortical material.   The capsular bag was reinflated with viscoelastic, checked, and found to be intact.  The intraocular lens was inserted into the capsular bag.  The irrigation/aspiration handpiece was used to remove any remaining viscoelastic.  The clear corneal wound and paracentesis wounds were then hydrated and checked with Weck-Cels to be watertight. Moxifloxacin was instilled into the anterior chamber.  The lid-speculum and drape were removed. The patient's face was cleaned with a wet and dry 4x4.  A clear shield was taped over the eye. The patient was taken to the post-operative care unit in good condition, having tolerated the procedure well.  Post-Op Instructions: The patient will follow up at [facility] for a same day post-operative evaluation and will receive all other orders and instructions.

## 2024-07-06 NOTE — Anesthesia Preprocedure Evaluation (Addendum)
"                                    Anesthesia Evaluation  Patient identified by MRN, date of birth, ID band Patient awake    Reviewed: Allergy & Precautions, H&P , NPO status , Patient's Chart, lab work & pertinent test results  History of Anesthesia Complications (+) PONV and history of anesthetic complications  Airway Mallampati: III  TM Distance: <3 FB Neck ROM: Full    Dental no notable dental hx.    Pulmonary former smoker   Pulmonary exam normal breath sounds clear to auscultation       Cardiovascular hypertension, Normal cardiovascular exam Rhythm:Regular Rate:Normal     Neuro/Psych  Headaches PSYCHIATRIC DISORDERS Anxiety Depression     Neuromuscular disease    GI/Hepatic negative GI ROS, Neg liver ROS,,,  Endo/Other  negative endocrine ROS    Renal/GU Renal disease  negative genitourinary   Musculoskeletal  (+)  Fibromyalgia -  Abdominal   Peds negative pediatric ROS (+)  Hematology negative hematology ROS (+)   Anesthesia Other Findings   Reproductive/Obstetrics negative OB ROS                              Anesthesia Physical Anesthesia Plan  ASA: 2  Anesthesia Plan: MAC   Post-op Pain Management:    Induction:   PONV Risk Score and Plan:   Airway Management Planned: Nasal Cannula  Additional Equipment:   Intra-op Plan:   Post-operative Plan:   Informed Consent: I have reviewed the patients History and Physical, chart, labs and discussed the procedure including the risks, benefits and alternatives for the proposed anesthesia with the patient or authorized representative who has indicated his/her understanding and acceptance.     Dental advisory given  Plan Discussed with: CRNA  Anesthesia Plan Comments:          Anesthesia Quick Evaluation  "

## 2024-07-06 NOTE — Transfer of Care (Signed)
 Immediate Anesthesia Transfer of Care Note  Patient: [name]  Procedure(s) Performed: PHACOEMULSIFICATION, CATARACT, WITH IOL INSERTION (Left: Eye)  Patient Location: Short Stay  Anesthesia Type:General  Level of Consciousness: awake  Airway & Oxygen Therapy: Patient Spontanous Breathing  Post-op Assessment: Report given to RN  Post vital signs: Reviewed and stable  Last Vitals:  Vitals Value Taken Time  BP 129/77 07/06/24 11:37  Temp 37.1 C 07/06/24 11:37  Pulse 81 07/06/24 11:37  Resp 18 07/06/24 11:37  SpO2 100 % 07/06/24 11:37    Last Pain:  Vitals:   07/06/24 1137  TempSrc: Oral  PainSc: 0-No pain      Patients Stated Pain Goal: 5 (07/06/24 1030)  Complications: No notable events documented.

## 2024-07-06 NOTE — Anesthesia Postprocedure Evaluation (Signed)
"   Anesthesia Post Note  Patient: [name]  Procedure(s) Performed: PHACOEMULSIFICATION, CATARACT, WITH IOL INSERTION (Left: Eye)  Patient location during evaluation: PACU Anesthesia Type: MAC Level of consciousness: awake and alert Pain management: pain level controlled Vital Signs Assessment: post-procedure vital signs reviewed and stable Respiratory status: spontaneous breathing, nonlabored ventilation, respiratory function stable and patient connected to nasal cannula oxygen Cardiovascular status: stable and blood pressure returned to baseline Postop Assessment: no apparent nausea or vomiting Anesthetic complications: no   No notable events documented.   Last Vitals:  Vitals:   07/06/24 1030 07/06/24 1137  BP: (!) 140/83 129/77  Pulse: 68 81  Resp: 18 18  Temp: 36.7 C 37.1 C  SpO2: 100% 100%    Last Pain:  Vitals:   07/06/24 1137  TempSrc: Oral  PainSc: 0-No pain                 Andrea Limes      "

## 2024-07-06 NOTE — Interval H&P Note (Signed)
 History and Physical Interval Note:  07/06/2024 10:34 AM  [name]  has presented today for surgery, with the diagnosis of nuclear sclerotic cataract, left eye.  The various methods of treatment have been discussed with the patient and family. After consideration of risks, benefits and other options for treatment, the patient has consented to  Procedures: PHACOEMULSIFICATION, CATARACT, WITH IOL INSERTION (Left) as a surgical intervention.  The patient's history has been reviewed, patient examined, no change in status, stable for surgery.  I have reviewed the patient's chart and labs.  Questions were answered to the patient's satisfaction.    The H and P was reviewed and updated. The patient was examined.  No changes were found after exam.  The surgical eye was marked.   [name]

## 2024-07-06 NOTE — Discharge Instructions (Signed)
 Please discharge patient when stable, will follow up today with Dr. Ilsa Iha at the [facility] office immediately following discharge.  Leave shield in place until visit.  All paperwork with discharge instructions will be given at the office.  [facility] Address:  [address]  [place], [place] [address]  Dr. Chaya Jan Phone: [phone]

## 2024-07-07 ENCOUNTER — Encounter (HOSPITAL_COMMUNITY): Payer: Self-pay | Admitting: Optometry

## 2024-07-21 ENCOUNTER — Other Ambulatory Visit: Payer: Self-pay | Admitting: Family Medicine

## 2024-07-21 DIAGNOSIS — E876 Hypokalemia: Secondary | ICD-10-CM

## 2024-12-01 ENCOUNTER — Encounter: Admitting: Family Medicine

## 2024-12-23 ENCOUNTER — Ambulatory Visit
